# Patient Record
Sex: Female | Born: 1955
Health system: Southern US, Community
[De-identification: ages and names within clinical notes are randomized; demographics above are authoritative.]

## PROBLEM LIST (undated history)

## (undated) DIAGNOSIS — M1990 Unspecified osteoarthritis, unspecified site: Secondary | ICD-10-CM

## (undated) DIAGNOSIS — I10 Essential (primary) hypertension: Secondary | ICD-10-CM

## (undated) DIAGNOSIS — M0520 Rheumatoid vasculitis with rheumatoid arthritis of unspecified site: Secondary | ICD-10-CM

## (undated) DIAGNOSIS — E7800 Pure hypercholesterolemia, unspecified: Secondary | ICD-10-CM

## (undated) DIAGNOSIS — E559 Vitamin D deficiency, unspecified: Secondary | ICD-10-CM

## (undated) DIAGNOSIS — E1169 Type 2 diabetes mellitus with other specified complication: Secondary | ICD-10-CM

## (undated) DIAGNOSIS — E669 Obesity, unspecified: Secondary | ICD-10-CM

## (undated) HISTORY — DX: Rheumatoid vasculitis with rheumatoid arthritis of unspecified site: M05.20

## (undated) HISTORY — DX: Type 2 diabetes mellitus with other specified complication: E11.69

## (undated) HISTORY — PX: TUBAL LIGATION: SHX77

## (undated) HISTORY — DX: Obesity, unspecified: E66.9

## (undated) HISTORY — DX: Essential (primary) hypertension: I10

## (undated) HISTORY — PX: BREAST EXCISIONAL BIOPSY: SUR124

## (undated) HISTORY — PX: ABDOMINAL HYSTERECTOMY: SHX81

## (undated) HISTORY — DX: Vitamin D deficiency, unspecified: E55.9

## (undated) HISTORY — DX: Pure hypercholesterolemia, unspecified: E78.00

## (undated) HISTORY — DX: Morbid (severe) obesity due to excess calories: E66.01

---
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
## 1998-08-17 ENCOUNTER — Inpatient Hospital Stay (HOSPITAL_COMMUNITY): Admission: RE | Admit: 1998-08-17 | Discharge: 1998-08-20 | Payer: Self-pay | Admitting: Obstetrics and Gynecology

## 1998-09-15 ENCOUNTER — Ambulatory Visit (HOSPITAL_COMMUNITY): Admission: RE | Admit: 1998-09-15 | Discharge: 1998-09-15 | Payer: Self-pay | Admitting: Obstetrics and Gynecology

## 1998-09-15 ENCOUNTER — Encounter: Payer: Self-pay | Admitting: Obstetrics and Gynecology

## 1999-09-01 ENCOUNTER — Other Ambulatory Visit: Admission: RE | Admit: 1999-09-01 | Discharge: 1999-09-01 | Payer: Self-pay | Admitting: Obstetrics and Gynecology

## 1999-09-29 ENCOUNTER — Encounter: Payer: Self-pay | Admitting: Obstetrics and Gynecology

## 1999-09-29 ENCOUNTER — Ambulatory Visit (HOSPITAL_COMMUNITY): Admission: RE | Admit: 1999-09-29 | Discharge: 1999-09-29 | Payer: Self-pay | Admitting: Obstetrics and Gynecology

## 2000-12-19 ENCOUNTER — Other Ambulatory Visit: Admission: RE | Admit: 2000-12-19 | Discharge: 2000-12-19 | Payer: Self-pay | Admitting: Obstetrics and Gynecology

## 2001-07-14 ENCOUNTER — Encounter: Payer: Self-pay | Admitting: Emergency Medicine

## 2001-07-14 ENCOUNTER — Encounter: Payer: Self-pay | Admitting: Internal Medicine

## 2001-07-14 ENCOUNTER — Inpatient Hospital Stay (HOSPITAL_COMMUNITY): Admission: EM | Admit: 2001-07-14 | Discharge: 2001-07-16 | Payer: Self-pay | Admitting: Emergency Medicine

## 2001-12-22 ENCOUNTER — Other Ambulatory Visit: Admission: RE | Admit: 2001-12-22 | Discharge: 2001-12-22 | Payer: Self-pay | Admitting: Obstetrics and Gynecology

## 2002-02-03 ENCOUNTER — Ambulatory Visit (HOSPITAL_COMMUNITY): Admission: RE | Admit: 2002-02-03 | Discharge: 2002-02-03 | Payer: Self-pay | Admitting: Rheumatology

## 2002-02-20 ENCOUNTER — Encounter: Payer: Self-pay | Admitting: Rheumatology

## 2002-02-20 ENCOUNTER — Encounter: Admission: RE | Admit: 2002-02-20 | Discharge: 2002-02-20 | Payer: Self-pay | Admitting: Rheumatology

## 2002-03-09 ENCOUNTER — Ambulatory Visit (HOSPITAL_COMMUNITY): Admission: RE | Admit: 2002-03-09 | Discharge: 2002-03-09 | Payer: Self-pay | Admitting: Rheumatology

## 2002-03-09 ENCOUNTER — Encounter: Payer: Self-pay | Admitting: Rheumatology

## 2002-03-20 ENCOUNTER — Encounter: Payer: Self-pay | Admitting: Rheumatology

## 2002-03-20 ENCOUNTER — Ambulatory Visit (HOSPITAL_COMMUNITY): Admission: RE | Admit: 2002-03-20 | Discharge: 2002-03-20 | Payer: Self-pay | Admitting: Rheumatology

## 2003-01-25 ENCOUNTER — Other Ambulatory Visit: Admission: RE | Admit: 2003-01-25 | Discharge: 2003-01-25 | Payer: Self-pay | Admitting: Obstetrics and Gynecology

## 2004-02-11 ENCOUNTER — Other Ambulatory Visit: Admission: RE | Admit: 2004-02-11 | Discharge: 2004-02-11 | Payer: Self-pay | Admitting: Obstetrics and Gynecology

## 2004-07-02 ENCOUNTER — Ambulatory Visit: Payer: Self-pay | Admitting: Hematology & Oncology

## 2004-12-02 ENCOUNTER — Emergency Department (HOSPITAL_COMMUNITY): Admission: AD | Admit: 2004-12-02 | Discharge: 2004-12-02 | Payer: Self-pay | Admitting: Family Medicine

## 2004-12-26 ENCOUNTER — Encounter: Admission: RE | Admit: 2004-12-26 | Discharge: 2004-12-26 | Payer: Self-pay | Admitting: Orthopaedic Surgery

## 2005-06-25 ENCOUNTER — Other Ambulatory Visit: Admission: RE | Admit: 2005-06-25 | Discharge: 2005-06-25 | Payer: Self-pay | Admitting: Obstetrics and Gynecology

## 2010-02-01 ENCOUNTER — Ambulatory Visit (HOSPITAL_BASED_OUTPATIENT_CLINIC_OR_DEPARTMENT_OTHER): Admission: RE | Admit: 2010-02-01 | Discharge: 2010-02-01 | Payer: Self-pay | Admitting: Internal Medicine

## 2010-02-01 ENCOUNTER — Ambulatory Visit: Payer: Self-pay | Admitting: Interventional Radiology

## 2010-09-25 ENCOUNTER — Other Ambulatory Visit: Payer: Self-pay | Admitting: Family Medicine

## 2010-09-25 DIAGNOSIS — M545 Low back pain, unspecified: Secondary | ICD-10-CM

## 2010-10-01 ENCOUNTER — Ambulatory Visit
Admission: RE | Admit: 2010-10-01 | Discharge: 2010-10-01 | Disposition: A | Discharge status: Home / Self Care | Payer: Federal, State, Local not specified - PPO | Source: Ambulatory Visit | Attending: Family Medicine | Admitting: Family Medicine

## 2010-10-01 DIAGNOSIS — M545 Low back pain, unspecified: Secondary | ICD-10-CM

## 2010-12-29 NOTE — Consult Note (Signed)
[place]. [facility]  Patient:    Rebekah [name], Rebekah [name] Visit Number: [id number] MRN: [id number]          Service Type: MED Location: [phone] Attending Physician:  Julieanne Manson Dictated by:   Marlan Palau, M.D. Proc. Date: 07/14/01 Admit Date:  07/14/2001   CC:         Lemmie Evens, M.D.             Julieanne Manson, M.D.                          Consultation Report  HISTORY OF PRESENT ILLNESS:  Rebekah [name] is a 55 year old right-handed black female born 12/09/1955 with a history of rheumatoid arthritis.  This patient is followed by Dr. Jimmy Footman and is also followed by Dr. Delrae Alfred. The patient has recently started prednisone within the last week or two and is currently on 15 mg of prednisone a day.  The patient felt poorly this morning, claimed to feel light-headed and according to the patients husband appeared to be "glassy eyed."  The patient, however, went to work.  Does recall going to work but does not remember doing anything once she got there.  Patient apparently started getting weak in a generalized fashion, started slurring her works, and appeared to be slightly confused.  Patient was taken to the emergency room for an evaluation, initially going to [facility] and then being sent over to [facility].  The patients husband claims at that point that she was requiring assistance to walk, feeling weak all over, tending to collapse, was slurring her words.  Patient herself recalls nothing of this.  The patient has been sent to [facility] and now is feeling back to baseline.  Patient denies any focal numbness or weakness on the arms, legs, or face.  Denies any visual field changes, loss of vision, double vision.  It is not clear that this patient ever actually blacked out or had any true syncope.  Neurology was asked to see this patient for further evaluation and management.  MRI scan of the brain has  been requested, but has not yet been done.  PAST MEDICAL HISTORY: 1. History of confusional episode, unknown etiology. 2. History of obesity. 3. Rheumatoid arthritis. 4. History of hysterectomy. 5. History of bilateral tubal ligation. 6. History of cesarean section x 2. 7. History of tongue biopsy for a birth mark.  MEDICATIONS: 1. Prednisone 15 mg q.d. 2. Methotrexate possibly 10 mg once a week. 3. Folate 1 mg q.d.  ALLERGIES:  No known drug allergies.  SOCIAL HISTORY:  Smokes a very occasional cigarette.  Drinks alcohol on occasion.  Patient is married, has three children.  All are alive and well. Patient works as a Sports coach for [employer].  FAMILY HISTORY:  Mother is alive and well.  Father died with intracranial hemorrhage at age 74.  Patient has three brothers, three sisters all alive and well.  REVIEW OF SYSTEMS:  No recent fevers, chills.  Patient denies any diaphoresis. Does note some slight neck pain.  Denies shortness of breath, chest pains, nausea, vomiting.  Denies any problems controlling the bowels or the bladder. Has some left ankle pain following a fall several weeks ago.  PHYSICAL EXAMINATION  VITAL SIGNS:  Blood pressure 130/85, heart rate 98, respiratory rate 20, temperature afebrile.  GENERAL:  This patient is a minimally to moderately  obese black female who is alert and cooperative at the time of examination.  HEENT:  Head is atraumatic.  Eyes:  Pupils are equal, round and reactive to light.  Disks are flat bilaterally.  NECK:  Supple.  No carotid bruits noted.  RESPIRATORY:  Clear to auscultation, percussion.  CARDIOVASCULAR:  Regular rate and rhythm without obvious murmurs or rubs.  EXTREMITIES:  Without significant edema.  NEUROLOGIC:  Cranial nerves as above.  Facial symmetry is present.  Patient has good sensation of the face to pin prick and soft touch bilaterally. Patient has good strength to facial muscles and the  muscles to head turn and shoulder shrug bilaterally.  Patient has no evidence of an aphasia.  Motor test reveals 5/5 strength in all fours.  Good symmetric motor tone is noted throughout.  Sensory testing is intact to pin prick, soft touch, vibratory sensation throughout.  Patient has good finger-nose-finger, heel-to-shin. Patient was not ambulated.  No pronator drift is seen.  Again, deep tendon reflexes are symmetric and normal.  Toes are downgoing bilaterally.  LABORATORIES:  White count 5.1, hemoglobin 12.3, hematocrit 34.6, MCV 85.6, platelets 259,000.  INR 1.0.  Sodium 136, potassium 3.6, chloride 105, CO2 25, glucose 119, BUN 10, creatinine 0.6, calcium 8.9, total protein 7.2, albumin 3.4, AST 18, ALT 23, alkaline phosphatase 84, total bilirubin 0.5.  Drug screen is negative.  Urinalysis reveals specific gravity 1.021, pH 7.0, positive nitrites seen.  IMPRESSION: 1. Confusional episode, etiology unclear. 2. Rheumatoid arthritis.  Need to evaluate this patient for a possible TIA or seizure or other metabolic disturbance.  It is not clear whether patient had any focal symptoms per se at all during the above event.  Patient seemed to have generalized weakness, slurred speech, and confusion.  Has no recollection whatsoever for several hours during the day today.  PLAN: 1. MRI scan of the brain has been ordered. 2. MR angiogram. 3. EEG study. 4. Aspirin therapy for now.  Will follow patients clinical course while    in-house. Dictated by:   Marlan Palau, M.D. Attending Physician:  Julieanne Manson DD:  07/14/01 TD:  07/15/01 Job: [id number]

## 2010-12-29 NOTE — H&P (Signed)
[place]. [facility]  Patient:    Rebekah [name], Rebekah [name] Visit Number: [id number] MRN: [id number]          Service Type: Attending:  Julieanne Manson, M.D. Dictated by:   Julieanne Manson, M.D. Adm. Date:  07/14/01                           History and Physical  CHIEF COMPLAINT: Episode of possible loss of consciousness, slurring, and maybe ataxia.  HISTORY OF PRESENT ILLNESS: This is a 55 year old female, with a history of newly diagnosed rheumatoid arthritis, admitted through the emergency department after an episode of possible loss of consciousness this morning. The patient states that she has been feeling fine and awakened from sleep feeling her usual self.  She did not eat breakfast.  She took her weekly dose of methotrexate and this is her third dose in doing so.  She believes it is around 10 mg.  She bathed and dressed for work and was sitting at her computer at homes and when arising from the computer felt light headed.  She states she continued to feel odd while driving her car to work, "eerie feeling," with the sense that she did not have complete control over her body though she did not note any actual motor problems in control of her body.  While walking to the outside door of her office building she felt as if she was stumbling.  Once she arrived at her actual office door she states she cannot recall anything further, though she does seem to recall comments that were made by co-workers who were trying to contact her husband.  The patient was taken to [facility] for initial evaluation and was felt to have some difficulty sitting up there, some slurred speech perhaps, and apparently there was a question of her grip strength, and she was transferred to [facility] Emergency Department for further evaluation.  Since being in the [facility] facility she has actually appeared normal. The  patient denies any history of loss of urine or stool control.  She has had no chest pain, shortness of breath, or palpitations associated with this.  The patient does state that she ran out of her prednisone 15 mg q.d. six days ago. There were no refills and she had an appointment today with her rheumatologist, Dr. Jimmy Footman, and felt that perhaps she would just get it refilled today if needed.  She does state that she has had increased discomfort in her joints, particularly her knees and her hands, since she ran out of the prednisone.  PAST MEDICAL HISTORY:  1. Obesity.  2. Rheumatoid arthritis, for which she has probably had symptoms for over a     year and this just worsened over the past month.  PAST SURGICAL HISTORY:  1. Cesarean section x 2, 1977 and 1984.  2. Right tongue biopsy in 1975, benign.  3. Uterine myomectomy in 1992.  4. Bilateral tubal ligation in 1990.  5. TAH/BSO in 2000 for benign reasons.  6. Tonsillectomy in 1967.  MEDICATIONS:  1. Methotrexate, I believe 10 mg weekly.  2. Folate 5 mg q.d.  3. Climara patch.  ALLERGIES: No known drug allergies.  FAMILY HISTORY: Father died at age 2 of cerebral hemorrhage.  Mother, age 76, is healthy.  Two sisters, ages 51 and 50, healthy.  Three children, ages 85, 5, and 27, healthy.  SOCIAL HISTORY: The  patient is married to her second husband for eight years. 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  LABORATORY DATA: In the emergency room CT of the brain was reportedly within normal limits.  WBC 5.1, hemoglobin 12.3, hematocrit 34.3, platelets 259,000.  Sodium 136, potassium 3.6, chloride 105, bicarbonate 25, glucose 119, creatinine 0.6, BUN 10.  Liver enzymes normal.  PT 13.2.  Drug screen negative.  Alcohol level negative.  EKG showed normal sinus rhythm.  PHYSICAL EXAMINATION:  VITAL SIGNS: The patient is afebrile.   Respiratory rate 16, blood pressure 128/76, heart rate 100.  No orthostasis on evaluation earlier in the emergency room.  O2 saturation 99% on room air.  GENERAL: The patient is an obese female, in no acute distress.  HEENT: PERRL.  EOMI.  Discs sharp.  Tympanic membranes clear.  Throat without injection.  NECK: Supple without adenopathy or thyromegaly.  CHEST: Clear.  CARDIOVASCULAR: Regular rate and rhythm.  Normal S1 and S2.  No S3, S4, or murmur appreciated.  No carotid bruits.  Carotid, radial, femoral, DP pulses normodynamic and equal.  ABDOMEN: Obese.  Bowel sounds present throughout.  Nontender.  No organomegaly or masses appreciated.  EXTREMITIES: Moderate swelling without erythema about the left ankle.  She is tender along the posterior fibula.  NEUROLOGIC: Alert and oriented x 3.  Cranial nerves 2-12 grossly intact. Motor essentially 5/5 throughout.  She is somewhat limited by arthritic pain and stiffness in her grip and in her left thigh strength testing.  Sensory is grossly within normal limits.  Speech is clear.  She has a good gag reflex. Rapid alternating motions are within normal limits.  ASSESSMENT/PLAN:  1. Episode of questionable syncope with slurred speech and possibly ataxia.     Dr. Anne Hahn has been consulted for evaluation for possible seizure or     transient ischemic attack with MRA/MRI of the brain and carotids ordered.     Electroencephalogram also ordered.  Other considerations would be adrenal     suppression or insufficiency since suddenly discontinuing her fairly     low-dose prednisone usage.  Will check adrenocorticotropic hormone     stimulation test and restart her prednisone subsequently.  This is     unlikely as a culprit since her blood pressure is stable and her     electrolytes are stable.  Will check a fasting cholesterol and BMET in the     morning and also will need to evaluate side effects of methotrexate.     Certainly the fact that  she did not eat breakfast this morning is not      helpful.  For now, treatment with intravenous fluids, aspirin, and follow     with telemetry.  2. Rheumatoid arthritis.  Restart prednisone tomorrow as long the patient     remains clinically stable overnight.  Will also check an x-ray of her     ankle.  She did bump her foot before this started and will make sure no     injury is the cause of this other than her rheumatoid arthritis. Dictated by:   Julieanne Manson, M.D. Attending:  Julieanne Manson, M.D. DD:  07/14/01 TD:  07/15/01 Job: [id number]

## 2013-09-11 ENCOUNTER — Encounter: Payer: Self-pay | Admitting: *Deleted

## 2013-09-11 ENCOUNTER — Encounter: Payer: Federal, State, Local not specified - PPO | Attending: Family Medicine | Admitting: *Deleted

## 2013-09-11 VITALS — Ht 65.0 in | Wt 226.3 lb

## 2013-09-11 DIAGNOSIS — Z713 Dietary counseling and surveillance: Secondary | ICD-10-CM | POA: Insufficient documentation

## 2013-09-11 DIAGNOSIS — I10 Essential (primary) hypertension: Secondary | ICD-10-CM

## 2013-09-11 DIAGNOSIS — E669 Obesity, unspecified: Secondary | ICD-10-CM | POA: Insufficient documentation

## 2013-09-11 NOTE — Progress Notes (Signed)
  Medical Nutrition Therapy:  Appt start time: 0800 end time:  0900.  Assessment:  Primary concerns today: HTN and obesity. Weight loss of 25 pounds since patient was referred her on 07/22/13! She states she is drinking more water @ 64 oz before work work and again after work.  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 She is working out in AM from 6 - 7  AM and again after work!   Preferred Learning Style: all of the following  Auditory  Visual  Hands on  Learning Readiness:   Ready  Change in progress  MEDICATIONS: see list   DIETARY INTAKE:  24-hr recall:  B ( AM): Protein shake with Silk milk,    Snk ( AM): fresh fruit  L ( PM): Lean Cuisine for lunch, no beverage Snk ( PM): fresh fruit D ( PM): egg white omelet with spinach OR lean meat, occasionally starch, green vegetables, water (used to drink juice) Snk ( PM): none Beverages: water  Usual physical activity: She is working with a Physiological scientist 3 times a week AND going to gym on other days walking 3 miles on treadmill and track combined.   Estimated energy needs: 1200 calories 135 g carbohydrates 90 g protein 33 g fat  Intervention:  Nutrition counseling for weight loss and HTN initiated. Discussed Carb Counting as method of portion control, reading food labels, and benefits of increased activity. Commended her on her weight loss of over 25 pounds to date and her positive behavior changes including increased water intake, activity level and food choices.   Plan:  Aim for 2 Carb Choices per meal (30 grams) +/- 1 either way  Aim for 0-1 Carbs per snack if hungry  Consider reading food labels for Total Carbohydrate of foods Continue with your activity level as tolerated GREAT JOB ALREADY!!!  Teaching Method Utilized: Visual, Auditory and Hands on  Handouts given during visit include: Carb Counting and Food Label handouts Meal Plan Card  Barriers to  learning/adherence to lifestyle change: none, she is doing great1  Demonstrated degree of understanding via:  Teach Back   Monitoring/Evaluation:  Dietary intake, exercise, reading food labels, and body weight in 1 month.

## 2013-09-11 NOTE — Patient Instructions (Signed)
Plan:  Aim for 2 Carb Choices per meal (30 grams) +/- 1 either way  Aim for 0-1 Carbs per snack if hungry  Consider reading food labels for Total Carbohydrate of foods Continue with your activity level as tolerated GREAT JOB ALREADY!!!

## 2013-11-03 ENCOUNTER — Encounter: Payer: Federal, State, Local not specified - PPO | Attending: Family Medicine | Admitting: *Deleted

## 2013-11-03 DIAGNOSIS — E669 Obesity, unspecified: Secondary | ICD-10-CM | POA: Insufficient documentation

## 2013-11-03 DIAGNOSIS — I10 Essential (primary) hypertension: Secondary | ICD-10-CM

## 2013-11-03 DIAGNOSIS — Z713 Dietary counseling and surveillance: Secondary | ICD-10-CM | POA: Insufficient documentation

## 2013-11-03 NOTE — Progress Notes (Signed)
  Medical Nutrition Therapy:  Appt start time: 1600 end time: 1630.  Assessment:  Primary concerns today: HTN and obesity follow up visit. She has found a new trainer that she relates to very well. She is going to the gym 3 or more times a week or will work out at home on stationary bike and with weights. She has cut out bread and is using more vegetables with each meal. She is tired of PNB, so we discussed other protein options. Weight is down 5 pounds in past 6 weeks!  Preferred Learning Style: all of the following  Auditory  Visual  Hands on  Learning Readiness:   Ready  Change in progress  MEDICATIONS: see list   DIETARY INTAKE:  24-hr recall:  B ( AM): Protein shake with Silk milk,    Snk ( AM): fresh fruit  L ( PM): Lean Cuisine for lunch, trying to drink more water now (no more juices!) Snk ( PM): fresh fruit D ( PM): egg white omelet with spinach OR lean meat, occasionally starch, green vegetables, water (used to drink juice) Snk ( PM): none Beverages: water  Usual physical activity: She is working with a Physiological scientist 3 times a week AND going to gym on other days walking 3 miles on treadmill and track combined.   Estimated energy needs: 1200 calories 135 g carbohydrates 90 g protein 33 g fat  Intervention:  Nutrition counseling for weight loss and HTN continued. Commended her on her Carb Counting as method of portion control, reading food labels, and her increased activity. Commended her on her continued weight loss and her positive behavior changes including increased water intake, activity level and food choices.   Plan:  Continue to aim for 2 Carb Choices per meal (30 grams) +/- 1 either way  Continue to aim for 0-1 Carbs per snack if hungry  Continue reading food labels for Total Carbohydrate of foods Continue with your activity level as tolerated Consider going on computer and look for Arm Chair Exercise Videos that you can do in your chair at work or at  home. GREAT JOB ALREADY!!!   Teaching Method Utilized: Visual, Auditory and Hands on  Handouts given during visit include: No new handouts at this visit  Barriers to learning/adherence to lifestyle change: none, she is doing great1  Demonstrated degree of understanding via:  Teach Back   Monitoring/Evaluation:  Dietary intake, exercise, reading food labels, and body weight in 6 weeks for weigh in only.

## 2013-11-03 NOTE — Patient Instructions (Signed)
Plan:  Continue to aim for 2 Carb Choices per meal (30 grams) +/- 1 either way  Continue to aim for 0-1 Carbs per snack if hungry  Continue reading food labels for Total Carbohydrate of foods Continue with your activity level as tolerated Consider going on computer and look for Arm Chair Exercise Videos that you can do in your chair at work or at home. GREAT JOB ALREADY!!!

## 2013-12-04 ENCOUNTER — Ambulatory Visit
Admission: RE | Admit: 2013-12-04 | Discharge: 2013-12-04 | Disposition: A | Discharge status: Home / Self Care | Payer: Federal, State, Local not specified - PPO | Source: Ambulatory Visit | Attending: Rheumatology | Admitting: Rheumatology

## 2013-12-04 ENCOUNTER — Other Ambulatory Visit: Payer: Self-pay | Admitting: Rheumatology

## 2013-12-04 DIAGNOSIS — M069 Rheumatoid arthritis, unspecified: Secondary | ICD-10-CM

## 2013-12-17 ENCOUNTER — Ambulatory Visit: Payer: Federal, State, Local not specified - PPO | Admitting: *Deleted

## 2014-03-31 ENCOUNTER — Other Ambulatory Visit: Payer: Self-pay | Admitting: Obstetrics and Gynecology

## 2014-03-31 DIAGNOSIS — R928 Other abnormal and inconclusive findings on diagnostic imaging of breast: Secondary | ICD-10-CM

## 2014-04-06 ENCOUNTER — Ambulatory Visit
Admission: RE | Admit: 2014-04-06 | Discharge: 2014-04-06 | Disposition: A | Discharge status: Home / Self Care | Payer: Federal, State, Local not specified - PPO | Source: Ambulatory Visit | Attending: Obstetrics and Gynecology | Admitting: Obstetrics and Gynecology

## 2014-04-06 ENCOUNTER — Other Ambulatory Visit: Payer: Self-pay | Admitting: Obstetrics and Gynecology

## 2014-04-06 DIAGNOSIS — R928 Other abnormal and inconclusive findings on diagnostic imaging of breast: Secondary | ICD-10-CM

## 2014-04-15 ENCOUNTER — Ambulatory Visit
Admission: RE | Admit: 2014-04-15 | Discharge: 2014-04-15 | Disposition: A | Discharge status: Home / Self Care | Payer: Federal, State, Local not specified - PPO | Source: Ambulatory Visit | Attending: Obstetrics and Gynecology | Admitting: Obstetrics and Gynecology

## 2014-04-15 ENCOUNTER — Other Ambulatory Visit: Payer: Self-pay | Admitting: Obstetrics and Gynecology

## 2014-04-15 DIAGNOSIS — R928 Other abnormal and inconclusive findings on diagnostic imaging of breast: Secondary | ICD-10-CM

## 2014-11-11 DIAGNOSIS — R7303 Prediabetes: Secondary | ICD-10-CM | POA: Insufficient documentation

## 2014-11-11 DIAGNOSIS — IMO0001 Reserved for inherently not codable concepts without codable children: Secondary | ICD-10-CM | POA: Insufficient documentation

## 2014-11-12 DIAGNOSIS — M159 Polyosteoarthritis, unspecified: Secondary | ICD-10-CM | POA: Insufficient documentation

## 2014-11-12 DIAGNOSIS — M0579 Rheumatoid arthritis with rheumatoid factor of multiple sites without organ or systems involvement: Secondary | ICD-10-CM | POA: Insufficient documentation

## 2015-04-11 ENCOUNTER — Other Ambulatory Visit: Payer: Self-pay | Admitting: Obstetrics and Gynecology

## 2015-04-11 DIAGNOSIS — R921 Mammographic calcification found on diagnostic imaging of breast: Secondary | ICD-10-CM

## 2015-04-12 LAB — CYTOLOGY - PAP

## 2015-05-23 ENCOUNTER — Ambulatory Visit
Admission: RE | Admit: 2015-05-23 | Discharge: 2015-05-23 | Disposition: A | Discharge status: Home / Self Care | Payer: Federal, State, Local not specified - PPO | Source: Ambulatory Visit | Attending: Obstetrics and Gynecology | Admitting: Obstetrics and Gynecology

## 2015-05-23 DIAGNOSIS — R921 Mammographic calcification found on diagnostic imaging of breast: Secondary | ICD-10-CM

## 2015-11-28 DIAGNOSIS — R609 Edema, unspecified: Secondary | ICD-10-CM | POA: Diagnosis not present

## 2015-12-19 DIAGNOSIS — R739 Hyperglycemia, unspecified: Secondary | ICD-10-CM | POA: Diagnosis not present

## 2015-12-19 DIAGNOSIS — Z0000 Encounter for general adult medical examination without abnormal findings: Secondary | ICD-10-CM | POA: Diagnosis not present

## 2015-12-19 DIAGNOSIS — Z79899 Other long term (current) drug therapy: Secondary | ICD-10-CM | POA: Diagnosis not present

## 2015-12-19 DIAGNOSIS — E559 Vitamin D deficiency, unspecified: Secondary | ICD-10-CM | POA: Diagnosis not present

## 2016-01-02 DIAGNOSIS — M25561 Pain in right knee: Secondary | ICD-10-CM | POA: Diagnosis not present

## 2016-01-12 DIAGNOSIS — M069 Rheumatoid arthritis, unspecified: Secondary | ICD-10-CM | POA: Diagnosis not present

## 2016-01-12 DIAGNOSIS — Z23 Encounter for immunization: Secondary | ICD-10-CM | POA: Diagnosis not present

## 2016-01-12 DIAGNOSIS — Z87891 Personal history of nicotine dependence: Secondary | ICD-10-CM | POA: Diagnosis not present

## 2016-01-13 DIAGNOSIS — Z6837 Body mass index (BMI) 37.0-37.9, adult: Secondary | ICD-10-CM | POA: Diagnosis not present

## 2016-01-13 DIAGNOSIS — Z713 Dietary counseling and surveillance: Secondary | ICD-10-CM | POA: Diagnosis not present

## 2016-03-26 DIAGNOSIS — E559 Vitamin D deficiency, unspecified: Secondary | ICD-10-CM | POA: Diagnosis not present

## 2016-04-13 DIAGNOSIS — Z13820 Encounter for screening for osteoporosis: Secondary | ICD-10-CM | POA: Diagnosis not present

## 2016-04-13 DIAGNOSIS — Z01419 Encounter for gynecological examination (general) (routine) without abnormal findings: Secondary | ICD-10-CM | POA: Diagnosis not present

## 2016-04-13 DIAGNOSIS — Z6837 Body mass index (BMI) 37.0-37.9, adult: Secondary | ICD-10-CM | POA: Diagnosis not present

## 2016-04-17 ENCOUNTER — Other Ambulatory Visit: Payer: Self-pay | Admitting: Obstetrics and Gynecology

## 2016-04-17 DIAGNOSIS — Z1231 Encounter for screening mammogram for malignant neoplasm of breast: Secondary | ICD-10-CM

## 2016-04-22 ENCOUNTER — Emergency Department (HOSPITAL_COMMUNITY)
Admission: EM | Admit: 2016-04-22 | Discharge: 2016-04-22 | Disposition: A | Discharge status: Home / Self Care | Payer: Federal, State, Local not specified - PPO | Attending: Emergency Medicine | Admitting: Emergency Medicine

## 2016-04-22 ENCOUNTER — Emergency Department (HOSPITAL_COMMUNITY): Payer: Federal, State, Local not specified - PPO

## 2016-04-22 ENCOUNTER — Encounter (HOSPITAL_COMMUNITY): Payer: Self-pay | Admitting: *Deleted

## 2016-04-22 DIAGNOSIS — R0602 Shortness of breath: Secondary | ICD-10-CM | POA: Diagnosis not present

## 2016-04-22 DIAGNOSIS — E041 Nontoxic single thyroid nodule: Secondary | ICD-10-CM | POA: Diagnosis not present

## 2016-04-22 DIAGNOSIS — Z87891 Personal history of nicotine dependence: Secondary | ICD-10-CM | POA: Diagnosis not present

## 2016-04-22 DIAGNOSIS — R0789 Other chest pain: Secondary | ICD-10-CM | POA: Insufficient documentation

## 2016-04-22 DIAGNOSIS — I10 Essential (primary) hypertension: Secondary | ICD-10-CM | POA: Insufficient documentation

## 2016-04-22 DIAGNOSIS — E876 Hypokalemia: Secondary | ICD-10-CM

## 2016-04-22 DIAGNOSIS — R079 Chest pain, unspecified: Secondary | ICD-10-CM

## 2016-04-22 DIAGNOSIS — R071 Chest pain on breathing: Secondary | ICD-10-CM | POA: Diagnosis present

## 2016-04-22 LAB — BASIC METABOLIC PANEL
Anion gap: 10 (ref 5–15)
BUN: 21 mg/dL — ABNORMAL HIGH (ref 6–20)
CO2: 30 mmol/L (ref 22–32)
Calcium: 9.5 mg/dL (ref 8.9–10.3)
Chloride: 97 mmol/L — ABNORMAL LOW (ref 101–111)
Creatinine, Ser: 0.97 mg/dL (ref 0.44–1.00)
GFR calc Af Amer: >60 mL/min (ref >60)
GFR calc non Af Amer: >60 mL/min (ref >60)
Glucose, Bld: 159 mg/dL — ABNORMAL HIGH (ref 65–99)
Potassium: 2.8 mmol/L — ABNORMAL LOW (ref 3.5–5.1)
Sodium: 137 mmol/L (ref 135–145)

## 2016-04-22 LAB — CBC
HCT: 37.1 % (ref 36.0–46.0)
Hemoglobin: 12.3 g/dL (ref 12.0–15.0)
MCH: 29.4 pg (ref 26.0–34.0)
MCHC: 33.2 g/dL (ref 30.0–36.0)
MCV: 88.5 fL (ref 78.0–100.0)
Platelets: 225 10*3/uL (ref 150–400)
RBC: 4.19 MIL/uL (ref 3.87–5.11)
RDW: 13.3 % (ref 11.5–15.5)
WBC: 7.2 10*3/uL (ref 4.0–10.5)

## 2016-04-22 LAB — I-STAT TROPONIN, ED
Troponin i, poc: 0.01 ng/mL (ref 0.00–0.08)
Troponin i, poc: 0 ng/mL (ref 0.00–0.08)

## 2016-04-22 LAB — D-DIMER, QUANTITATIVE: D-Dimer, Quant: 0.42 ug/mL-FEU (ref 0.00–0.50)

## 2016-04-22 MED ORDER — SODIUM CHLORIDE 0.9 % IV BOLUS (SEPSIS)
500.0000 mL | Freq: Once | INTRAVENOUS | Status: AC
  Administered 2016-04-22: 500 mL via INTRAVENOUS

## 2016-04-22 MED ORDER — IOPAMIDOL (ISOVUE-370) INJECTION 76%
INTRAVENOUS | Status: AC
  Administered 2016-04-22: 100 mL
  Filled 2016-04-22: qty 100

## 2016-04-22 MED ORDER — KETOROLAC TROMETHAMINE 15 MG/ML IJ SOLN
15.0000 mg | Freq: Once | INTRAMUSCULAR | Status: AC
Start: 2016-04-22 — End: 2016-04-22
  Administered 2016-04-22: 15 mg via INTRAVENOUS
  Filled 2016-04-22: qty 1

## 2016-04-22 MED ORDER — POTASSIUM CHLORIDE CRYS ER 20 MEQ PO TBCR
40.0000 meq | EXTENDED_RELEASE_TABLET | Freq: Once | ORAL | Status: AC
  Administered 2016-04-22: 40 meq via ORAL
  Filled 2016-04-22: qty 2

## 2016-04-22 MED ORDER — NAPROXEN 500 MG PO TABS: 500.0000 mg | ORAL_TABLET | Freq: Two times a day (BID) | ORAL | 0 refills | Status: DC

## 2016-04-22 MED ORDER — ONDANSETRON HCL 4 MG/2ML IJ SOLN
4.0000 mg | Freq: Once | INTRAMUSCULAR | Status: AC
  Administered 2016-04-22: 4 mg via INTRAVENOUS
  Filled 2016-04-22: qty 2

## 2016-04-22 MED ORDER — HYDROCODONE-ACETAMINOPHEN 5-325 MG PO TABS: 1.0000 | ORAL_TABLET | Freq: Four times a day (QID) | ORAL | 0 refills | Status: DC | PRN

## 2016-04-22 MED ORDER — MORPHINE SULFATE (PF) 2 MG/ML IV SOLN
2.0000 mg | Freq: Once | INTRAVENOUS | Status: AC
  Administered 2016-04-22: 2 mg via INTRAVENOUS
  Filled 2016-04-22: qty 1

## 2016-04-22 MED ORDER — GI COCKTAIL ~~LOC~~
30.0000 mL | Freq: Once | ORAL | Status: AC
  Administered 2016-04-22: 30 mL via ORAL
  Filled 2016-04-22: qty 30

## 2016-04-22 MED ORDER — ASPIRIN 81 MG PO CHEW
324.0000 mg | CHEWABLE_TABLET | Freq: Once | ORAL | Status: AC
  Administered 2016-04-22: 324 mg via ORAL
  Filled 2016-04-22: qty 4

## 2016-04-22 MED ORDER — NITROGLYCERIN 0.4 MG SL SUBL
0.4000 mg | SUBLINGUAL_TABLET | SUBLINGUAL | Status: DC | PRN
  Administered 2016-04-22: 0.4 mg via SUBLINGUAL
  Filled 2016-04-22: qty 1

## 2016-04-22 MED ORDER — POTASSIUM CHLORIDE 10 MEQ/100ML IV SOLN
10.0000 meq | INTRAVENOUS | Status: AC
Start: 2016-04-22 — End: 2016-04-22
  Administered 2016-04-22 (×2): 10 meq via INTRAVENOUS
  Filled 2016-04-22 (×2): qty 100

## 2016-04-22 NOTE — ED Provider Notes (Signed)
Pt checked out by Dr. Tyrone Nine waiting on 2nd trop.  2nd trop was neg however when pt attempted to stand and walk she was in severe pain in the left side of her chest which seems to be chest wall and no improving. Morphine initially helped a little but now gone.  Will given toradol.  D-dimer is neg and CXR wnl.  No hx of trauma.   10:25 AM On evaluation patient is in significant pain. She has severe pain with any movement and she states it is in the distal portion of the sternum and radiates into her back. She has no abdominal pain or arm pain. Morphine was helpful for short time but is having recurrent pain. Was not improved with a GI cocktail is not affected by eating. Her possible dissection. Patient's d-dimer is negative making the risk of PE lower the patient does take estrogen. Given patient's ongoing significant pain we'll do a CT dissection protocol for further evaluation.  1:22 PM CT neg for dissection or PE.  Pt does have thryroid nodule to f/u on but overall feeling better.  Pt able to walk with improved pain.  Pt ready for d/c.  Pt has no sx concerning for pneumonia on exam and felt fine till last night.  CLINICAL DATA:  Chest pain radiating to back.  Shortness of breath.  EXAM: CT ANGIOGRAPHY CHEST, ABDOMEN AND PELVIS  TECHNIQUE: Multidetector CT imaging through the chest, abdomen and pelvis was performed using the standard protocol during bolus administration of intravenous contrast. Multiplanar reconstructed images and MIPs were obtained and reviewed to evaluate the vascular anatomy.  CONTRAST:  100 mL of Isovue 370  COMPARISON:  None.  FINDINGS: CTA CHEST FINDINGS  Evaluation of the thoracic aorta is somewhat limited due to streak artifact off of venous contrast and cardiac motion. The thoracic aorta is normal in caliber, measuring 3.5 cm in the ascending thoracic aorta and less more distally. No significant atherosclerotic changes seen in the thoracic aorta. No  dissection identified. Branches off the arch of the thoracic aorta are normal in appearance. The pulmonary arteries are normal in caliber. The study was not tailored to evaluate for pulmonary emboli but none are seen. There is a nodule off the posterior right thyroid lobe on axial image 12 of series 6 measuring 17 mm. No definitive coronary artery calcifications. The heart size borderline. No effusions. The central airways are normal. No pneumothorax streaky opacities seen in the right middle lobe, the lingula, and bases, right greater than left. Two tiny nodules are seen in the lateral left lung on series 7, image 46 measuring less than 4 mm. A 4 mm nodule is seen in the right middle lobe on series 7, image 54. No other nodules or masses identified.  Review of the MIP images confirms the above findings.  CTA ABDOMEN AND PELVIS FINDINGS  The abdominal aorta is normal in caliber with no dissection or atherosclerotic change. The celiac artery is normal in appearance. The SMA is normal. There are 2 right renal arteries and a single left renal artery, normal in appearance. The inferior mesenteric artery is well opacified as well. The iliac arteries in the proximal femoral arteries are normal in appearance with no stenosis or dissection.  No free air or free fluid.  Evaluation of parenchymal organs is limited due to arterial phase imaging. The liver and gallbladder are normal. The portal vein is not opacified due to timing of contrast. The spleen, pancreas, and adrenal glands are normal. The  kidneys are unremarkable. No adenopathy. Evaluation of bowel is limited due to lack of oral contrast. Within this limitation, the stomach and small bowel are unremarkable. Colonic diverticuli are seen without diverticulitis. The appendix is well visualized with no appendicitis. There is a fat containing umbilical hernia.  No adenopathy or mass in the pelvis. The bladder is decompressed  but unremarkable. The patient is status post hysterectomy.  Mild degenerative changes in the thoracic spine and lower lumbar facets. No acute bony abnormalities. There is a bone island in the proximal right femur.  Review of the MIP images confirms the above findings.  IMPRESSION: 1. No aneurysm or dissection in the thoracic or abdominal aorta. The vessels are unremarkable. 2. Streaky opacity in the lung bases. While there is a component of atelectasis, superimposed pneumonia is not excluded on this study. Recommend follow-up to resolution. 3. Pulmonary nodules as noted above. See below for follow-up recommendations. 4. **An incidental finding of potential clinical significance has been found. There is a 17 mm right thyroid lobe nodule. Recommend ultrasound for better evaluation. This can be performed as an outpatient** No follow-up needed if patient is low-risk (and has no known or suspected primary neoplasm). Non-contrast chest CT can be considered in 12 months if patient is high-risk. This recommendation follows the consensus statement: Guidelines for Management of Incidental Pulmonary Nodules Detected on CT Images:From the Fleischner Society 2017; published online before print (10.1148/radiol.SG:5268862).   Blanchie Dessert, MD 04/22/16 [phone]

## 2016-04-22 NOTE — ED Notes (Signed)
Patient transported to CT 

## 2016-04-22 NOTE — ED Provider Notes (Signed)
[facility] DEPT Provider Note   CSN: [id number] Arrival date & time: 04/22/16  0253     History   Chief Complaint Chief Complaint  Patient presents with  . Chest Pain    HPI Rebekah [name] is a 60 y.o. female.  60 yo F with a chief complaint of chest pain. This been going on since this morning. Pain is worse with movement palpation breathing. Also worse when she lays back flat. Feels that radiates up into her neck. Denies nausea diaphoresis. She has some shortness of breath associated with this. She feels that somewhat is worse when she exerts herself. No history of prior MI. No family history, patient only risk factor is hypertension. She is on estrogen therapy for menopause. Denies history of prior PE or DVT. Denies hemoptysis long travel recent surgery.   The history is provided by the patient.  Chest Pain   This is a new problem. The current episode started 12 to 24 hours ago. The problem occurs constantly. The problem has not changed since onset.The pain is associated with movement and breathing. The pain is present in the substernal region, lateral region and epigastric region. The pain is at a severity of 8/10. The pain is moderate. The quality of the pain is described as exertional, sharp, pressure-like and pleuritic. The pain radiates to the left neck and right neck. Duration of episode(s) is 1 day. The symptoms are aggravated by certain positions, deep breathing and exertion. Associated symptoms include shortness of breath. Pertinent negatives include no dizziness, no fever, no headaches, no nausea, no palpitations and no vomiting. She has tried nothing for the symptoms. The treatment provided no relief. Risk factors: HTN.  Pertinent negatives for past medical history include no MI and no PE.    Past Medical History:  Diagnosis Date  . Hypertension     There are no active problems to display for this patient.   Past Surgical History:  Procedure Laterality Date  .  ABDOMINAL HYSTERECTOMY    . biopsy on tongue  1976  . CESAREAN SECTION    . TUBAL LIGATION      OB History    No data available       Home Medications    Prior to Admission medications   Medication Sig Start Date End Date Taking? Authorizing Provider  cholecalciferol (VITAMIN D) 1000 units tablet Take 1,000 Units by mouth daily.   Yes Historical Provider, MD  estradiol (ESTRACE) 1 MG tablet Take 1 mg by mouth daily.   Yes Historical Provider, MD  hydrochlorothiazide (HYDRODIURIL) 25 MG tablet Take 25 mg by mouth daily.   Yes Historical Provider, MD  leflunomide (ARAVA) 20 MG tablet Take 20 mg by mouth daily. 01/12/16  Yes Historical Provider, MD  lubiprostone (AMITIZA) 24 MCG capsule Take 24 mcg by mouth daily. 08/07/14  Yes Historical Provider, MD  Multiple Vitamin (MULTIVITAMIN) capsule Take 1 capsule by mouth daily.   Yes Historical Provider, MD    Family History No family history on file.  Social History Social History  Substance Use Topics  . Smoking status: Former Smoker    Quit date: 09/11/2010  . Smokeless tobacco: Never Used  . Alcohol use Yes     Comment: monthly      Allergies   Review of patient's allergies indicates no known allergies.   Review of Systems Review of Systems  Constitutional: Negative for chills and fever.  HENT: Negative for congestion and rhinorrhea.   Eyes: Negative for redness  and visual disturbance.  Respiratory: Positive for shortness of breath. Negative for wheezing.   Cardiovascular: Positive for chest pain. Negative for palpitations.  Gastrointestinal: Negative for nausea and vomiting.  Genitourinary: Negative for dysuria and urgency.  Musculoskeletal: Negative for arthralgias and myalgias.  Skin: Negative for pallor and wound.  Neurological: Negative for dizziness and headaches.     Physical Exam Updated Vital Signs BP 116/79   Pulse 100   Temp 99.9 F (37.7 C)   Resp 25   Ht 5\' 5"  (1.651 m)   Wt 223 lb 1 oz (101.2 kg)    SpO2 96%   BMI 37.12 kg/m   Physical Exam  Constitutional: She is oriented to person, place, and time. She appears well-developed and well-nourished. No distress.  HENT:  Head: Normocephalic and atraumatic.  Eyes: EOM are normal. Pupils are equal, round, and reactive to light.  Neck: Normal range of motion. Neck supple.  Cardiovascular: Normal rate and regular rhythm.  Exam reveals no gallop and no friction rub.   No murmur heard. Pulmonary/Chest: Effort normal. She has no wheezes. She has no rales. She exhibits tenderness (about the anterior chest wall).  Abdominal: Soft. She exhibits no distension and no mass. There is no tenderness. There is no guarding.  Musculoskeletal: She exhibits no edema or tenderness.  Neurological: She is alert and oriented to person, place, and time.  Skin: Skin is warm and dry. She is not diaphoretic.  Psychiatric: She has a normal mood and affect. Her behavior is normal.  Nursing note and vitals reviewed.    ED Treatments / Results  Labs (all labs ordered are listed, but only abnormal results are displayed) Labs Reviewed  BASIC METABOLIC PANEL - Abnormal; Notable for the following:       Result Value   Potassium 2.8 (*)    Chloride 97 (*)    Glucose, Bld 159 (*)    BUN 21 (*)    All other components within normal limits  CBC  D-DIMER, QUANTITATIVE (NOT AT [facility])  I-STAT TROPOININ, ED    EKG  EKG Interpretation  Date/Time:  Sunday April 22 2016 02:58:27 EDT Ventricular Rate:  105 PR Interval:  172 QRS Duration: 90 QT Interval:  352 QTC Calculation: 465 R Axis:   17 Text Interpretation:  Sinus tachycardia Otherwise normal ECG No significant change since last tracing Confirmed by Connery Shiffler MD, Quillian Quince [phone]) on 04/22/2016 3:12:38 AM       Radiology Dg Chest 2 View  Result Date: 04/22/2016 CLINICAL DATA:  Acute onset of mid chest pain and shortness of breath. Initial encounter. EXAM: CHEST  2 VIEW COMPARISON:  Chest radiograph  performed 12/04/2013 FINDINGS: The lungs are well-aerated. Mild bibasilar atelectasis is noted. There is no evidence of pleural effusion or pneumothorax. The heart is borderline normal in size. No acute osseous abnormalities are seen. IMPRESSION: Mild bibasilar atelectasis noted.  Lungs otherwise clear. Electronically Signed   By: Garald Balding M.D.   On: 04/22/2016 03:53    Procedures Procedures (including critical care time)  Medications Ordered in ED Medications  nitroGLYCERIN (NITROSTAT) SL tablet 0.4 mg (0.4 mg Sublingual Given 04/22/16 0607)  potassium chloride 10 mEq in 100 mL IVPB (10 mEq Intravenous New Bag/Given 04/22/16 0635)  morphine 2 MG/ML injection 2 mg (not administered)  ondansetron (ZOFRAN) injection 4 mg (not administered)  aspirin chewable tablet 324 mg (324 mg Oral Given 04/22/16 0541)  gi cocktail (Maalox,Lidocaine,Donnatal) (30 mLs Oral Given 04/22/16 0541)  potassium chloride SA (K-DUR,KLOR-CON)  CR tablet 40 mEq (40 mEq Oral Given 04/22/16 [id number])     Initial Impression / Assessment and Plan / ED Course  I have reviewed the triage vital signs and the nursing notes.  Pertinent labs & imaging results that were available during my care of the patient were reviewed by me and considered in my medical decision making (see chart for details).  Clinical Course    60 yo F With a chief complaint of chest pain. Most likely muscular skeletal by history and physical. Patient does have a risk factor of hypertension and his female some a present atypically. Will obtain a delta troponin as well as a d-dimer.  Initial trop negative, ddimer negative.  Patient continuing to have pain, will give dose of morphine.   Turned over to Dr. Maryan Rued, awaiting delta trop.  The patients results and plan were reviewed and discussed.   Any x-rays performed were independently reviewed by myself.   Differential diagnosis were considered with the presenting HPI.  Medications  nitroGLYCERIN  (NITROSTAT) SL tablet 0.4 mg (0.4 mg Sublingual Given 04/22/16 0607)  potassium chloride 10 mEq in 100 mL IVPB (10 mEq Intravenous New Bag/Given 04/22/16 0635)  morphine 2 MG/ML injection 2 mg (not administered)  ondansetron (ZOFRAN) injection 4 mg (not administered)  aspirin chewable tablet 324 mg (324 mg Oral Given 04/22/16 0541)  gi cocktail (Maalox,Lidocaine,Donnatal) (30 mLs Oral Given 04/22/16 0541)  potassium chloride SA (K-DUR,KLOR-CON) CR tablet 40 mEq (40 mEq Oral Given 04/22/16 0635)    Vitals:   04/22/16 0306 04/22/16 0308 04/22/16 0600  BP: 115/76  116/79  Pulse: 100  100  Resp: 24  25  Temp: 99.9 F (37.7 C)    SpO2: 99%  96%  Weight:  223 lb 1 oz (101.2 kg)   Height:  5\' 5"  (1.651 m)     Final diagnoses:  Hypokalemia  Nonspecific chest pain        Final Clinical Impressions(s) / ED Diagnoses   Final diagnoses:  Hypokalemia  Nonspecific chest pain    New Prescriptions New Prescriptions   No medications on file     Deno Etienne, DO 04/22/16 [id number]

## 2016-04-22 NOTE — ED Triage Notes (Signed)
Mid-chest pain since last pm with sob  No other symptoms

## 2016-04-22 NOTE — ED Notes (Signed)
Pt complains of ongoing CP and states the morphine helped for a couple minutes but it came back and the pain is still a 10/10.  Relayed this information to attending Dr.

## 2016-04-22 NOTE — Discharge Instructions (Addendum)
Take 4 over the counter ibuprofen tablets 3 times a day or 2 over-the-counter naproxen tablets twice a day for pain. Also take tylenol 1000mg (2 extra strength) four times a day.   Follow up with your PCP to see if they want further workup(like a stress test)

## 2016-04-27 DIAGNOSIS — Z23 Encounter for immunization: Secondary | ICD-10-CM | POA: Diagnosis not present

## 2016-04-27 DIAGNOSIS — E041 Nontoxic single thyroid nodule: Secondary | ICD-10-CM | POA: Diagnosis not present

## 2016-04-27 DIAGNOSIS — R739 Hyperglycemia, unspecified: Secondary | ICD-10-CM | POA: Diagnosis not present

## 2016-04-27 DIAGNOSIS — E876 Hypokalemia: Secondary | ICD-10-CM | POA: Diagnosis not present

## 2016-05-01 ENCOUNTER — Other Ambulatory Visit: Payer: Self-pay | Admitting: Family Medicine

## 2016-05-01 DIAGNOSIS — E041 Nontoxic single thyroid nodule: Secondary | ICD-10-CM

## 2016-05-07 DIAGNOSIS — M069 Rheumatoid arthritis, unspecified: Secondary | ICD-10-CM | POA: Diagnosis not present

## 2016-05-07 DIAGNOSIS — Z87891 Personal history of nicotine dependence: Secondary | ICD-10-CM | POA: Diagnosis not present

## 2016-05-09 DIAGNOSIS — E119 Type 2 diabetes mellitus without complications: Secondary | ICD-10-CM | POA: Diagnosis not present

## 2016-05-25 ENCOUNTER — Ambulatory Visit
Admission: RE | Admit: 2016-05-25 | Discharge: 2016-05-25 | Disposition: A | Discharge status: Home / Self Care | Payer: Federal, State, Local not specified - PPO | Source: Ambulatory Visit | Attending: Obstetrics and Gynecology | Admitting: Obstetrics and Gynecology

## 2016-05-25 DIAGNOSIS — Z1231 Encounter for screening mammogram for malignant neoplasm of breast: Secondary | ICD-10-CM

## 2016-06-04 ENCOUNTER — Ambulatory Visit
Admission: RE | Admit: 2016-06-04 | Discharge: 2016-06-04 | Disposition: A | Discharge status: Home / Self Care | Payer: Federal, State, Local not specified - PPO | Source: Ambulatory Visit | Attending: Family Medicine | Admitting: Family Medicine

## 2016-06-04 DIAGNOSIS — E042 Nontoxic multinodular goiter: Secondary | ICD-10-CM | POA: Diagnosis not present

## 2016-06-04 DIAGNOSIS — E041 Nontoxic single thyroid nodule: Secondary | ICD-10-CM

## 2016-07-13 DIAGNOSIS — M21611 Bunion of right foot: Secondary | ICD-10-CM | POA: Diagnosis not present

## 2016-07-13 DIAGNOSIS — M21612 Bunion of left foot: Secondary | ICD-10-CM | POA: Diagnosis not present

## 2016-07-23 DIAGNOSIS — M069 Rheumatoid arthritis, unspecified: Secondary | ICD-10-CM | POA: Diagnosis not present

## 2016-07-23 DIAGNOSIS — M0579 Rheumatoid arthritis with rheumatoid factor of multiple sites without organ or systems involvement: Secondary | ICD-10-CM | POA: Diagnosis not present

## 2016-08-15 DIAGNOSIS — H40023 Open angle with borderline findings, high risk, bilateral: Secondary | ICD-10-CM | POA: Diagnosis not present

## 2016-11-04 ENCOUNTER — Encounter (HOSPITAL_COMMUNITY): Payer: Self-pay

## 2016-11-04 ENCOUNTER — Emergency Department (HOSPITAL_COMMUNITY): Payer: Federal, State, Local not specified - PPO

## 2016-11-04 ENCOUNTER — Emergency Department (HOSPITAL_COMMUNITY)
Admission: EM | Admit: 2016-11-04 | Discharge: 2016-11-04 | Disposition: A | Discharge status: Home / Self Care | Payer: Federal, State, Local not specified - PPO | Attending: Emergency Medicine | Admitting: Emergency Medicine

## 2016-11-04 DIAGNOSIS — Z87891 Personal history of nicotine dependence: Secondary | ICD-10-CM | POA: Diagnosis not present

## 2016-11-04 DIAGNOSIS — I10 Essential (primary) hypertension: Secondary | ICD-10-CM | POA: Insufficient documentation

## 2016-11-04 DIAGNOSIS — J181 Lobar pneumonia, unspecified organism: Secondary | ICD-10-CM | POA: Diagnosis not present

## 2016-11-04 DIAGNOSIS — J189 Pneumonia, unspecified organism: Secondary | ICD-10-CM

## 2016-11-04 DIAGNOSIS — R079 Chest pain, unspecified: Secondary | ICD-10-CM | POA: Diagnosis not present

## 2016-11-04 DIAGNOSIS — R072 Precordial pain: Secondary | ICD-10-CM | POA: Diagnosis present

## 2016-11-04 LAB — CBC WITH DIFFERENTIAL/PLATELET
Basophils Absolute: 0 10*3/uL (ref 0.0–0.1)
Basophils Relative: 0 %
Eosinophils Absolute: 0 10*3/uL (ref 0.0–0.7)
Eosinophils Relative: 0 %
HCT: 37 % (ref 36.0–46.0)
Hemoglobin: 12.5 g/dL (ref 12.0–15.0)
Lymphocytes Relative: 15 %
Lymphs Abs: 1.6 10*3/uL (ref 0.7–4.0)
MCH: 29.8 pg (ref 26.0–34.0)
MCHC: 33.8 g/dL (ref 30.0–36.0)
MCV: 88.1 fL (ref 78.0–100.0)
Monocytes Absolute: 0.7 10*3/uL (ref 0.1–1.0)
Monocytes Relative: 6 %
Neutro Abs: 8.5 10*3/uL — ABNORMAL HIGH (ref 1.7–7.7)
Neutrophils Relative %: 79 %
Platelets: 192 10*3/uL (ref 150–400)
RBC: 4.2 MIL/uL (ref 3.87–5.11)
RDW: 13 % (ref 11.5–15.5)
WBC: 10.9 10*3/uL — ABNORMAL HIGH (ref 4.0–10.5)

## 2016-11-04 LAB — I-STAT CHEM 8, ED
BUN: 14 mg/dL (ref 6–20)
Calcium, Ion: 0.99 mmol/L — ABNORMAL LOW (ref 1.15–1.40)
Chloride: 103 mmol/L (ref 101–111)
Creatinine, Ser: 0.7 mg/dL (ref 0.44–1.00)
Glucose, Bld: 152 mg/dL — ABNORMAL HIGH (ref 65–99)
HCT: 39 % (ref 36.0–46.0)
Hemoglobin: 13.3 g/dL (ref 12.0–15.0)
Potassium: 3.6 mmol/L (ref 3.5–5.1)
Sodium: 136 mmol/L (ref 135–145)
TCO2: 22 mmol/L (ref 0–100)

## 2016-11-04 LAB — I-STAT TROPONIN, ED: Troponin i, poc: 0 ng/mL (ref 0.00–0.08)

## 2016-11-04 LAB — D-DIMER, QUANTITATIVE (NOT AT ARMC): D-Dimer, Quant: 0.62 ug/mL-FEU — ABNORMAL HIGH (ref 0.00–0.50)

## 2016-11-04 LAB — BASIC METABOLIC PANEL
Anion gap: 12 (ref 5–15)
BUN: 11 mg/dL (ref 6–20)
CO2: 22 mmol/L (ref 22–32)
Calcium: 9.4 mg/dL (ref 8.9–10.3)
Chloride: 100 mmol/L — ABNORMAL LOW (ref 101–111)
Creatinine, Ser: 0.75 mg/dL (ref 0.44–1.00)
GFR calc Af Amer: >60 mL/min (ref >60)
GFR calc non Af Amer: >60 mL/min (ref >60)
Glucose, Bld: 150 mg/dL — ABNORMAL HIGH (ref 65–99)
Potassium: 3.6 mmol/L (ref 3.5–5.1)
Sodium: 134 mmol/L — ABNORMAL LOW (ref 135–145)

## 2016-11-04 MED ORDER — AMOXICILLIN-POT CLAVULANATE 875-125 MG PO TABS
1.0000 | ORAL_TABLET | Freq: Once | ORAL | Status: AC
  Administered 2016-11-04: 1 via ORAL
  Filled 2016-11-04: qty 1

## 2016-11-04 MED ORDER — DICYCLOMINE HCL 10 MG/ML IM SOLN
20.0000 mg | Freq: Once | INTRAMUSCULAR | Status: AC
  Administered 2016-11-04: 20 mg via INTRAMUSCULAR
  Filled 2016-11-04: qty 2

## 2016-11-04 MED ORDER — GI COCKTAIL ~~LOC~~
30.0000 mL | Freq: Once | ORAL | Status: AC
  Administered 2016-11-04: 30 mL via ORAL
  Filled 2016-11-04: qty 30

## 2016-11-04 MED ORDER — AMOXICILLIN-POT CLAVULANATE 875-125 MG PO TABS: 1.0000 | ORAL_TABLET | Freq: Two times a day (BID) | ORAL | 0 refills | Status: DC

## 2016-11-04 MED ORDER — IOPAMIDOL (ISOVUE-370) INJECTION 76%
INTRAVENOUS | Status: AC
  Administered 2016-11-04: 100 mL
  Filled 2016-11-04: qty 100

## 2016-11-04 MED ORDER — KETOROLAC TROMETHAMINE 30 MG/ML IJ SOLN
30.0000 mg | Freq: Once | INTRAMUSCULAR | Status: AC
  Administered 2016-11-04: 30 mg via INTRAVENOUS
  Filled 2016-11-04: qty 1

## 2016-11-04 MED ORDER — KETOROLAC TROMETHAMINE 60 MG/2ML IM SOLN: 60.0000 mg | Freq: Once | INTRAMUSCULAR | Status: DC

## 2016-11-04 MED ORDER — IBUPROFEN 800 MG PO TABS: 800.0000 mg | ORAL_TABLET | Freq: Three times a day (TID) | ORAL | 0 refills | Status: DC

## 2016-11-04 NOTE — ED Triage Notes (Signed)
Pt complaining of central chest pain that radiates to bilateral neck and arms x 6 hrs. Pt states hx of same, told was dehydrated. Pt denies any SOB or N/V.

## 2016-11-04 NOTE — ED Notes (Signed)
In room to reassess pain.  Reports pain is still at 10/10 after being woke up.  Falling back to sleep quickly.

## 2016-11-04 NOTE — ED Notes (Signed)
Delay in lab draw pt not in room. 

## 2016-11-04 NOTE — ED Provider Notes (Signed)
[facility] DEPT Provider Note   CSN: [id number] Arrival date & time: 11/04/16  0208  By signing my name below, I, Reola Mosher, attest that this documentation has been prepared under the direction and in the presence of Herberth Deharo, MD. Electronically Signed: Reola Mosher, ED Scribe. 11/04/16. 2:35 AM.  History   Chief Complaint Chief Complaint  Patient presents with  . Chest Pain   The history is provided by the patient and medical records. No language interpreter was used.  Chest Pain   This is a new problem. The current episode started 6 to 12 hours ago. The problem occurs constantly. The problem has not changed since onset.The pain is associated with rest. The pain is present in the substernal region. The pain is at a severity of 10/10. The quality of the pain is described as sharp. The pain does not radiate. Pertinent negatives include no abdominal pain, no diaphoresis, no fever, no nausea, no palpitations, no shortness of breath and no vomiting. Risk factors include obesity.  Pertinent negatives for past medical history include no aneurysm, no Marfan's syndrome and no MI.  Pertinent negatives for family medical history include: no aortic dissection and no early MI.  Procedure history is negative for cardiac catheterization.    HPI Comments: Rebekah [name] is a 61 y.o. female with a h/o HTN,  who presents to the Emergency Department complaining of non-radiating, sharp centralized chest pain beginning seved hours ago. She notes radiation of her pain into her bilateral neck and arms. Per pt, she was at rest when her pain began and was not performed exertional activities. Per pt, she had just eat fatty foods prior to the onset of her pain. Per pt, she has a h/o similar pain which she was seen for the ED for in ~7 months ago. Dissection, PE were ruled out, pain was ruled as MSK at that time. No alleviating or exacerbating factors are noted. No treatments for her  symptoms were tried prior to coming into the ED. Pt has had normal PO intake prior to that onset of her symptoms. No h/o hiatal hernia. No Hx of PE/DVT, recent long travel, surgery, fracture, prolonged immobilization, hormone use.  She denies shortness of breath, nausea, vomiting, abdominal pain, fevers, leg swelling, wheezing or any other associated symptoms.   Past Medical History:  Diagnosis Date  . Hypertension    There are no active problems to display for this patient.  Past Surgical History:  Procedure Laterality Date  . ABDOMINAL HYSTERECTOMY    . biopsy on tongue  1976  . CESAREAN SECTION    . TUBAL LIGATION     OB History    No data available     Home Medications    Prior to Admission medications   Medication Sig Start Date End Date Taking? Authorizing Provider  cholecalciferol (VITAMIN D) 1000 units tablet Take 1,000 Units by mouth daily.    Historical Provider, MD  estradiol (ESTRACE) 1 MG tablet Take 1 mg by mouth daily.    Historical Provider, MD  hydrochlorothiazide (HYDRODIURIL) 25 MG tablet Take 25 mg by mouth daily.    Historical Provider, MD  HYDROcodone-acetaminophen (NORCO/VICODIN) 5-325 MG tablet Take 1-2 tablets by mouth every 6 (six) hours as needed for severe pain. 04/22/16   Blanchie Dessert, MD  leflunomide (ARAVA) 20 MG tablet Take 20 mg by mouth daily. 01/12/16   Historical Provider, MD  lubiprostone (AMITIZA) 24 MCG capsule Take 24 mcg by mouth daily. 08/07/14  Historical Provider, MD  Multiple Vitamin (MULTIVITAMIN) capsule Take 1 capsule by mouth daily.    Historical Provider, MD  naproxen (NAPROSYN) 500 MG tablet Take 1 tablet (500 mg total) by mouth 2 (two) times daily. 04/22/16   Blanchie Dessert, MD   Family History History reviewed. No pertinent family history.  Social History Social History  Substance Use Topics  . Smoking status: Former Smoker    Quit date: 09/11/2010  . Smokeless tobacco: Never Used  . Alcohol use Yes     Comment: monthly     Allergies   Patient has no known allergies.  Review of Systems Review of Systems  Constitutional: Negative for appetite change, chills, diaphoresis and fever.  HENT: Negative for drooling and facial swelling.   Eyes: Negative for photophobia.  Respiratory: Negative for shortness of breath.   Cardiovascular: Positive for chest pain. Negative for palpitations and leg swelling.  Gastrointestinal: Negative for abdominal pain, anal bleeding, nausea and vomiting.  Genitourinary: Negative for difficulty urinating.  Neurological: Negative for facial asymmetry and speech difficulty.  Psychiatric/Behavioral: Negative for suicidal ideas.  All other systems reviewed and are negative.  Physical Exam Updated Vital Signs BP (!) 175/94 (BP Location: Right Arm)   Pulse 92   Temp 99.6 F (37.6 C) (Oral)   Resp 16   Ht 5\' 6"  (1.676 m)   Wt 230 lb (104.3 kg)   SpO2 96%   BMI 37.12 kg/m   Physical Exam  Constitutional: She appears well-developed and well-nourished.  HENT:  Head: Normocephalic.  Mouth/Throat: Oropharynx is clear and moist. No oropharyngeal exudate.  Eyes: Conjunctivae and EOM are normal. Pupils are equal, round, and reactive to light. Right eye exhibits no discharge. Left eye exhibits no discharge. No scleral icterus.  Neck: Normal range of motion. Neck supple. No JVD present. No tracheal deviation present.  Trachea is midline. No stridor or carotid bruits.   Cardiovascular: Normal rate, regular rhythm, normal heart sounds and intact distal pulses.   No murmur heard. Pulmonary/Chest: Effort normal and breath sounds normal. No stridor. No respiratory distress. She has no wheezes. She has no rales.  Lungs CTA bilaterally.  Abdominal: Soft. She exhibits no distension. Bowel sounds are increased. There is no tenderness. There is no rebound and no guarding.  Gassy throughout.   Musculoskeletal: Normal range of motion. She exhibits no edema or tenderness.  All compartments are  soft. No palpable cords. No edema.   Lymphadenopathy:    She has no cervical adenopathy.  Neurological: She is alert. She has normal reflexes. She displays normal reflexes. She exhibits normal muscle tone.  Skin: Skin is warm and dry. Capillary refill takes less than 2 seconds.  Psychiatric: She has a normal mood and affect. Her behavior is normal.  Nursing note and vitals reviewed.  ED Treatments / Results  DIAGNOSTIC STUDIES: Oxygen Saturation is 96% on RA, normal by my interpretation.   COORDINATION OF CARE: 2:30 AM-Discussed next steps with pt. Pt verbalized understanding and is agreeable with the plan.   Results for orders placed or performed during the hospital encounter of 56/21/30  Basic metabolic panel  Result Value Ref Range   Sodium 137 135 - 145 mmol/L   Potassium 2.8 (L) 3.5 - 5.1 mmol/L   Chloride 97 (L) 101 - 111 mmol/L   CO2 30 22 - 32 mmol/L   Glucose, Bld 159 (H) 65 - 99 mg/dL   BUN 21 (H) 6 - 20 mg/dL   Creatinine, Ser 0.97 0.44 -  1.00 mg/dL   Calcium 9.5 8.9 - 10.3 mg/dL   GFR calc non Af Amer >60 >60 mL/min   GFR calc Af Amer >60 >60 mL/min   Anion gap 10 5 - 15  CBC  Result Value Ref Range   WBC 7.2 4.0 - 10.5 K/uL   RBC 4.19 3.87 - 5.11 MIL/uL   Hemoglobin 12.3 12.0 - 15.0 g/dL   HCT 37.1 36.0 - 46.0 %   MCV 88.5 78.0 - 100.0 fL   MCH 29.4 26.0 - 34.0 pg   MCHC 33.2 30.0 - 36.0 g/dL   RDW 13.3 11.5 - 15.5 %   Platelets 225 150 - 400 K/uL  D-dimer, quantitative (not at [facility])  Result Value Ref Range   D-Dimer, Quant 0.42 0.00 - 0.50 ug/mL-FEU  I-stat troponin, ED  Result Value Ref Range   Troponin i, poc 0.01 0.00 - 0.08 ng/mL   Comment 3          I-stat troponin, ED  Result Value Ref Range   Troponin i, poc 0.00 0.00 - 0.08 ng/mL   Comment 3           No results found.   EKG Interpretation  Date/Time:  Sunday November 04 2016 02:16:39 EDT Ventricular Rate:  92 PR Interval:    QRS Duration: 90 QT Interval:  352 QTC Calculation: 436 R  Axis:   2 Text Interpretation:  Sinus rhythm Left ventricular hypertrophy Confirmed by [facility]  MD, Emmaline Kluver ([id number]) on 11/04/2016 2:20:49 AM      Procedures Procedures   Medications Ordered in ED Medications - No data to display  Final Clinical Impressions(s) / ED Diagnoses   Final diagnoses:  None   This is a 61 y.o. -year-old female presents with substernal chest pain. The patient is nontoxic-appearing on exam and vital signs are within normal limits.   EKG normal. Trop negative. CXR negative. Ruled out for PE give pain with elevated wbc and borderling temperature will treat for PNA.  Follow up with your PMD in 2 days for recheck.  Strict return precautions for shortness of breath, persistent chest pain,  Dyspnea on exertions, fevers > 101 weakness lethargy or any concerns.    After history, exam, and medical workup I feel the patient has been appropriately medically screened and is safe for discharge home. Pertinent diagnoses were discussed with the patient. Patient was given return precautions.  I personally performed the services described in this documentation, which was scribed in my presence. The recorded information has been reviewed and is accurate.       Veatrice Kells, MD 11/04/16 [phone]

## 2016-11-05 DIAGNOSIS — Z8701 Personal history of pneumonia (recurrent): Secondary | ICD-10-CM | POA: Diagnosis not present

## 2016-11-05 DIAGNOSIS — R740 Nonspecific elevation of levels of transaminase and lactic acid dehydrogenase [LDH]: Secondary | ICD-10-CM | POA: Diagnosis not present

## 2016-11-05 DIAGNOSIS — Z79899 Other long term (current) drug therapy: Secondary | ICD-10-CM | POA: Diagnosis not present

## 2016-11-05 DIAGNOSIS — M069 Rheumatoid arthritis, unspecified: Secondary | ICD-10-CM | POA: Diagnosis not present

## 2016-11-08 DIAGNOSIS — J189 Pneumonia, unspecified organism: Secondary | ICD-10-CM | POA: Diagnosis not present

## 2016-11-29 DIAGNOSIS — L259 Unspecified contact dermatitis, unspecified cause: Secondary | ICD-10-CM | POA: Diagnosis not present

## 2017-02-05 DIAGNOSIS — L309 Dermatitis, unspecified: Secondary | ICD-10-CM | POA: Diagnosis not present

## 2017-04-19 ENCOUNTER — Other Ambulatory Visit: Payer: Self-pay | Admitting: Family Medicine

## 2017-04-19 DIAGNOSIS — E041 Nontoxic single thyroid nodule: Secondary | ICD-10-CM | POA: Diagnosis not present

## 2017-04-19 DIAGNOSIS — Z1211 Encounter for screening for malignant neoplasm of colon: Secondary | ICD-10-CM | POA: Diagnosis not present

## 2017-04-19 DIAGNOSIS — E119 Type 2 diabetes mellitus without complications: Secondary | ICD-10-CM | POA: Diagnosis not present

## 2017-04-19 DIAGNOSIS — Z23 Encounter for immunization: Secondary | ICD-10-CM | POA: Diagnosis not present

## 2017-05-01 ENCOUNTER — Other Ambulatory Visit: Payer: Self-pay | Admitting: Obstetrics and Gynecology

## 2017-05-01 DIAGNOSIS — Z6838 Body mass index (BMI) 38.0-38.9, adult: Secondary | ICD-10-CM | POA: Diagnosis not present

## 2017-05-01 DIAGNOSIS — Z01419 Encounter for gynecological examination (general) (routine) without abnormal findings: Secondary | ICD-10-CM | POA: Diagnosis not present

## 2017-05-01 DIAGNOSIS — Z1231 Encounter for screening mammogram for malignant neoplasm of breast: Secondary | ICD-10-CM

## 2017-05-28 ENCOUNTER — Emergency Department (HOSPITAL_COMMUNITY): Payer: Federal, State, Local not specified - PPO

## 2017-05-28 ENCOUNTER — Encounter (HOSPITAL_COMMUNITY): Payer: Self-pay

## 2017-05-28 ENCOUNTER — Emergency Department (HOSPITAL_COMMUNITY)
Admission: EM | Admit: 2017-05-28 | Discharge: 2017-05-28 | Disposition: A | Discharge status: Home / Self Care | Payer: Federal, State, Local not specified - PPO | Attending: Physician Assistant | Admitting: Physician Assistant

## 2017-05-28 DIAGNOSIS — R9431 Abnormal electrocardiogram [ECG] [EKG]: Secondary | ICD-10-CM | POA: Insufficient documentation

## 2017-05-28 DIAGNOSIS — R0789 Other chest pain: Secondary | ICD-10-CM | POA: Insufficient documentation

## 2017-05-28 DIAGNOSIS — R61 Generalized hyperhidrosis: Secondary | ICD-10-CM | POA: Insufficient documentation

## 2017-05-28 DIAGNOSIS — R0602 Shortness of breath: Secondary | ICD-10-CM | POA: Diagnosis not present

## 2017-05-28 DIAGNOSIS — I10 Essential (primary) hypertension: Secondary | ICD-10-CM | POA: Diagnosis not present

## 2017-05-28 DIAGNOSIS — Z79899 Other long term (current) drug therapy: Secondary | ICD-10-CM | POA: Insufficient documentation

## 2017-05-28 DIAGNOSIS — R079 Chest pain, unspecified: Secondary | ICD-10-CM

## 2017-05-28 DIAGNOSIS — Z87891 Personal history of nicotine dependence: Secondary | ICD-10-CM | POA: Diagnosis not present

## 2017-05-28 DIAGNOSIS — R112 Nausea with vomiting, unspecified: Secondary | ICD-10-CM | POA: Insufficient documentation

## 2017-05-28 DIAGNOSIS — Z7982 Long term (current) use of aspirin: Secondary | ICD-10-CM | POA: Diagnosis not present

## 2017-05-28 LAB — BASIC METABOLIC PANEL
Anion gap: 8 (ref 5–15)
BUN: 19 mg/dL (ref 6–20)
CO2: 27 mmol/L (ref 22–32)
Calcium: 9.2 mg/dL (ref 8.9–10.3)
Chloride: 103 mmol/L (ref 101–111)
Creatinine, Ser: 0.84 mg/dL (ref 0.44–1.00)
GFR calc Af Amer: >60 mL/min (ref >60)
GFR calc non Af Amer: >60 mL/min (ref >60)
Glucose, Bld: 147 mg/dL — ABNORMAL HIGH (ref 65–99)
Potassium: 3.7 mmol/L (ref 3.5–5.1)
Sodium: 138 mmol/L (ref 135–145)

## 2017-05-28 LAB — I-STAT TROPONIN, ED
Troponin i, poc: 0 ng/mL (ref 0.00–0.08)
Troponin i, poc: 0.01 ng/mL (ref 0.00–0.08)

## 2017-05-28 LAB — CBC
HCT: 36.8 % (ref 36.0–46.0)
Hemoglobin: 12 g/dL (ref 12.0–15.0)
MCH: 29.1 pg (ref 26.0–34.0)
MCHC: 32.6 g/dL (ref 30.0–36.0)
MCV: 89.3 fL (ref 78.0–100.0)
Platelets: 219 10*3/uL (ref 150–400)
RBC: 4.12 MIL/uL (ref 3.87–5.11)
RDW: 13.7 % (ref 11.5–15.5)
WBC: 9.7 10*3/uL (ref 4.0–10.5)

## 2017-05-28 MED ORDER — IOPAMIDOL (ISOVUE-370) INJECTION 76%
INTRAVENOUS | Status: AC
  Administered 2017-05-28: 100 mL
  Filled 2017-05-28: qty 100

## 2017-05-28 NOTE — ED Triage Notes (Signed)
Patient complains of CP since 9pm last night. Sent from [facility] urgent [facility] for further evaluation. Denies any associated symptoms. Alert and oiented

## 2017-05-28 NOTE — ED Provider Notes (Signed)
[place] EMERGENCY DEPARTMENT Provider Note   CSN: [id number] Arrival date & time: 05/28/17  1111     History   Chief Complaint No chief complaint on file.   HPI Rebekah [name] is a 61 y.o. female with history of HTN who presents to the ED from urgent care with chest pain and abnormal EKG. Patient states has been in her usual cerebellopontine last night which started experiencing pressure-like substernal chest pain. Pain is nonradiating associated with shortness of breath, nausea, vomiting, or diaphoresis. No association to food intake. Does have a cardiac history. Has not taken any medications for it. States it worsens when she lays flat in bed and improved when she leans forward. States she flew from [facility] yesterday. She is on HRT. Does not smoke. Denies recent illness, fever, chills, shortness of breath, abdominal pain, nausea, vomiting, or changes in bowel movements.  HPI  Past Medical History:  Diagnosis Date  . Hypertension     There are no active problems to display for this patient.   Past Surgical History:  Procedure Laterality Date  . ABDOMINAL HYSTERECTOMY    . biopsy on tongue  1976  . CESAREAN SECTION    . TUBAL LIGATION      OB History    No data available       Home Medications    Prior to Admission medications   Medication Sig Start Date End Date Taking? Authorizing Provider  aspirin 81 MG chewable tablet Chew 81 mg by mouth daily.   Yes [provider]  estradiol (ESTRACE) 1 MG tablet Take 1 mg by mouth daily.   Yes [provider]  hydrochlorothiazide (HYDRODIURIL) 25 MG tablet Take 25 mg by mouth daily.   Yes [provider]  ibuprofen (ADVIL,MOTRIN) 800 MG tablet Take 1 tablet (800 mg total) by mouth 3 (three) times daily. 11/04/16  Yes Palumbo, April, MD  leflunomide (ARAVA) 20 MG tablet Take 20 mg by mouth daily. 01/12/16  Yes [provider]  lubiprostone (AMITIZA) 24 MCG capsule  Take 24 mcg by mouth 2 (two) times daily as needed.   Yes [provider]  rosuvastatin (CRESTOR) 5 MG tablet Take 5 mg by mouth daily. 04/22/17  Yes [provider]  HYDROcodone-acetaminophen (NORCO/VICODIN) 5-325 MG tablet Take 1-2 tablets by mouth every 6 (six) hours as needed for severe pain. Patient not taking: Reported on 11/04/2016 04/22/16   Blanchie Dessert, MD  naproxen (NAPROSYN) 500 MG tablet Take 1 tablet (500 mg total) by mouth 2 (two) times daily. Patient not taking: Reported on 11/04/2016 04/22/16   Blanchie Dessert, MD    Family History No family history on file.  Social History Social History  Substance Use Topics  . Smoking status: Former Smoker    Quit date: 09/11/2010  . Smokeless tobacco: Never Used  . Alcohol use Yes     Comment: monthly      Allergies   Patient has no known allergies.   Review of Systems Review of Systems  Constitutional: Negative for activity change, appetite change, chills, diaphoresis, fatigue and fever.  Respiratory: Negative for cough, choking, chest tightness and shortness of breath.   Cardiovascular: Positive for chest pain. Negative for palpitations and leg swelling.  Gastrointestinal: Negative for abdominal distention, abdominal pain, blood in stool, constipation, diarrhea and vomiting.  Genitourinary: Negative for difficulty urinating.  Musculoskeletal: Negative for arthralgias, joint swelling and myalgias.  All other systems reviewed and are negative.    Physical  Exam Updated Vital Signs BP 130/77   Pulse 94   Temp 99 F (37.2 C) (Oral)   Resp (!) 27   Ht 5\' 5"  (1.651 m)   Wt 104.8 kg (231 lb)   SpO2 97%   BMI 38.44 kg/m   Physical Exam  Constitutional: Vital signs are normal. She appears well-developed and well-nourished. She is cooperative.  Non-toxic appearance. She does not appear ill. No distress.  Cardiovascular: Normal rate, regular rhythm, S1 normal, S2 normal and intact distal pulses.  Exam  reveals no friction rub.   No murmur heard. Pulmonary/Chest: Effort normal and breath sounds normal. No accessory muscle usage. No respiratory distress.  Abdominal: Soft. Bowel sounds are normal. There is no tenderness. There is no rebound and no guarding.  Neurological: She is alert.  Ext: warm and well perfused, no calf tenderness bilaterally, no LE pitting edema bilaterally     ED Treatments / Results  Labs (all labs ordered are listed, but only abnormal results are displayed) Labs Reviewed  BASIC METABOLIC PANEL - Abnormal; Notable for the following:       Result Value   Glucose, Bld 147 (*)    All other components within normal limits  CBC  I-STAT TROPONIN, ED    EKG  EKG Interpretation  Date/Time:  Tuesday May 28 2017 11:14:13 EDT Ventricular Rate:  96 PR Interval:  152 QRS Duration: 82 QT Interval:  348 QTC Calculation: 439 R Axis:   7 Text Interpretation:  Normal sinus rhythm Moderate voltage criteria for LVH, may be normal variant ST elevation, consider early repolarization Borderline ECG No significant change since last tracing Confirmed by [place], [place] [phone]) on 05/28/2017 11:49:24 AM       Radiology Dg Chest 2 View  Result Date: 05/28/2017 CLINICAL DATA:  Chest pain and tightness over the last 12 hours EXAM: CHEST  2 VIEW COMPARISON:  Chest x-ray of 11/04/2016 FINDINGS: The lungs are not well aerated with mild basilar atelectasis present. A tiny left pleural effusion cannot be excluded. No pneumonia is seen. Mediastinal and hilar contours are unchanged and the heart remains mildly enlarged. There are degenerative changes in the lower thoracic spine. IMPRESSION: Poor inspiration with bibasilar atelectasis and questionable tiny left pleural effusion. Electronically Signed   By: Ivar Drape M.D.   On: 05/28/2017 11:32    Procedures Procedures (including critical care time)  Medications Ordered in ED Medications - No data to display   Initial  Impression / Assessment and Plan / ED Course  I have reviewed the triage vital signs and the nursing notes.  Pertinent labs & imaging results that were available during my care of the patient were reviewed by me and considered in my medical decision making (see chart for details).     Danene L Massman is a 61 y.o. female with history of HTN who presents to the ED from urgent care with chest pain and abnormal EKG. Patient is having substernal chest pain that improves with leaning forward. Hemodynamically stable. EKG with diffuse ST elevations, but troponin negative. DDx: PE, pericarditis, ACS. CTA chest ordered to evaluate for PE.  1355 Reassessed patient. She is sleeping comfortably in bed in no acute distress. Husband in the room with her. Awaiting for CTA.   37 CTA negative for PE. Will order second troponin and plan to d/c home if negative.    Case discussed with Dr. Thomasene Lot.   Final Clinical Impressions(s) / ED Diagnoses   Final diagnoses:  Chest pain, unspecified  type    New Prescriptions New Prescriptions   No medications on file     Welford Roche, MD 05/28/17 1510    Macarthur Critchley, MD 05/28/17 1536

## 2017-05-28 NOTE — Discharge Instructions (Signed)
You were seen at [facility] in the ED for chest pain. We did a CT of your chest that looked normal and showed you did not have a clot in your lungs. You can take Aleve every 8 hours as needed for pain. Please make an appointment with your PCP. He can provide a different medication for pain if you need it and refer you to a cardiologist if necessary.

## 2017-05-28 NOTE — ED Notes (Signed)
Patient transported to CT 

## 2017-05-30 DIAGNOSIS — M069 Rheumatoid arthritis, unspecified: Secondary | ICD-10-CM | POA: Diagnosis not present

## 2017-05-31 ENCOUNTER — Ambulatory Visit
Admission: RE | Admit: 2017-05-31 | Discharge: 2017-05-31 | Disposition: A | Discharge status: Home / Self Care | Payer: Federal, State, Local not specified - PPO | Source: Ambulatory Visit | Attending: Family Medicine | Admitting: Family Medicine

## 2017-05-31 ENCOUNTER — Ambulatory Visit
Admission: RE | Admit: 2017-05-31 | Discharge: 2017-05-31 | Disposition: A | Discharge status: Home / Self Care | Payer: Federal, State, Local not specified - PPO | Source: Ambulatory Visit | Attending: Obstetrics and Gynecology | Admitting: Obstetrics and Gynecology

## 2017-05-31 DIAGNOSIS — Z1231 Encounter for screening mammogram for malignant neoplasm of breast: Secondary | ICD-10-CM | POA: Diagnosis not present

## 2017-05-31 DIAGNOSIS — E042 Nontoxic multinodular goiter: Secondary | ICD-10-CM | POA: Diagnosis not present

## 2017-05-31 DIAGNOSIS — E041 Nontoxic single thyroid nodule: Secondary | ICD-10-CM

## 2017-06-14 DIAGNOSIS — E7800 Pure hypercholesterolemia, unspecified: Secondary | ICD-10-CM | POA: Diagnosis not present

## 2017-06-21 DIAGNOSIS — Z01818 Encounter for other preprocedural examination: Secondary | ICD-10-CM | POA: Diagnosis not present

## 2017-07-03 DIAGNOSIS — I10 Essential (primary) hypertension: Secondary | ICD-10-CM | POA: Diagnosis not present

## 2017-07-03 DIAGNOSIS — R079 Chest pain, unspecified: Secondary | ICD-10-CM | POA: Diagnosis not present

## 2017-07-03 DIAGNOSIS — E7800 Pure hypercholesterolemia, unspecified: Secondary | ICD-10-CM | POA: Diagnosis not present

## 2017-07-12 DIAGNOSIS — K635 Polyp of colon: Secondary | ICD-10-CM | POA: Diagnosis not present

## 2017-07-12 DIAGNOSIS — D126 Benign neoplasm of colon, unspecified: Secondary | ICD-10-CM | POA: Diagnosis not present

## 2017-07-12 DIAGNOSIS — Z1211 Encounter for screening for malignant neoplasm of colon: Secondary | ICD-10-CM | POA: Diagnosis not present

## 2017-07-16 DIAGNOSIS — Z1211 Encounter for screening for malignant neoplasm of colon: Secondary | ICD-10-CM | POA: Diagnosis not present

## 2017-07-16 DIAGNOSIS — K635 Polyp of colon: Secondary | ICD-10-CM | POA: Diagnosis not present

## 2017-07-16 DIAGNOSIS — D126 Benign neoplasm of colon, unspecified: Secondary | ICD-10-CM | POA: Diagnosis not present

## 2017-07-19 DIAGNOSIS — I10 Essential (primary) hypertension: Secondary | ICD-10-CM | POA: Diagnosis not present

## 2017-09-10 DIAGNOSIS — H40023 Open angle with borderline findings, high risk, bilateral: Secondary | ICD-10-CM | POA: Diagnosis not present

## 2017-10-10 ENCOUNTER — Encounter: Payer: Self-pay | Admitting: Cardiovascular Disease

## 2017-10-10 ENCOUNTER — Ambulatory Visit (INDEPENDENT_AMBULATORY_CARE_PROVIDER_SITE_OTHER): Payer: Federal, State, Local not specified - PPO | Admitting: Cardiovascular Disease

## 2017-10-10 VITALS — BP 142/78 | HR 65 | Ht 65.0 in | Wt 231.2 lb

## 2017-10-10 DIAGNOSIS — E7800 Pure hypercholesterolemia, unspecified: Secondary | ICD-10-CM

## 2017-10-10 DIAGNOSIS — R079 Chest pain, unspecified: Secondary | ICD-10-CM | POA: Diagnosis not present

## 2017-10-10 DIAGNOSIS — E1169 Type 2 diabetes mellitus with other specified complication: Secondary | ICD-10-CM | POA: Insufficient documentation

## 2017-10-10 DIAGNOSIS — E669 Obesity, unspecified: Secondary | ICD-10-CM | POA: Diagnosis not present

## 2017-10-10 DIAGNOSIS — Z6835 Body mass index (BMI) 35.0-35.9, adult: Secondary | ICD-10-CM

## 2017-10-10 DIAGNOSIS — I10 Essential (primary) hypertension: Secondary | ICD-10-CM | POA: Insufficient documentation

## 2017-10-10 HISTORY — DX: Type 2 diabetes mellitus with other specified complication: E66.9

## 2017-10-10 HISTORY — DX: Pure hypercholesterolemia, unspecified: E78.00

## 2017-10-10 HISTORY — DX: Essential (primary) hypertension: I10

## 2017-10-10 HISTORY — DX: Type 2 diabetes mellitus with other specified complication: E11.69

## 2017-10-10 HISTORY — DX: Morbid (severe) obesity due to excess calories: E66.01

## 2017-10-10 MED ORDER — AMLODIPINE BESYLATE 5 MG PO TABS: 5.0000 mg | ORAL_TABLET | Freq: Every day | ORAL | 3 refills | Status: DC

## 2017-10-10 NOTE — Patient Instructions (Addendum)
Medication Instructions:  STOP BYSTOLIC   START AMLODIPINE 5 MG DAILY   Labwork: NONE  Testing/Procedures: Your physician has requested that you have an exercise tolerance test. For further information please visit [email or web address]. Please also follow instruction sheet, as given.  Follow-Up: Your physician recommends that you schedule a follow-up appointment in: 6 WEEKS WITH DR Kerhonkson PA ON A DAY SHE IS IN THE OFFICE. IF YOU PREFER WAITING AND SEEING DR Happy IT MAY BE LONGER THAN 6 WEEKS   If you need a refill on your cardiac medications before your next appointment, please call your pharmacy.   Exercise Stress Electrocardiogram An exercise stress electrocardiogram is a test to check how blood flows to your heart. It is done to find areas of poor blood flow. You will need to walk on a treadmill for this test. The electrocardiogram will record your heartbeat when you are at rest and when you are exercising. What happens before the procedure?  Do not have drinks with caffeine or foods with caffeine for 24 hours before the test, or as told by your doctor. This includes coffee, tea (even decaf tea), sodas, chocolate, and cocoa.  Follow your doctor's instructions about eating and drinking before the test.  Ask your doctor what medicines you should or should not take before the test. Take your medicines with water unless told by your doctor not to.  If you use an inhaler, bring it with you to the test.  Bring a snack to eat after the test.  Do not  smoke for 4 hours before the test.  Do not put lotions, powders, creams, or oils on your chest before the test.  Wear comfortable shoes and clothing. What happens during the procedure?  You will have patches put on your chest. Small areas of your chest may need to be shaved. Wires will be connected to the patches.  Your heart rate will be watched while you are resting and while you are exercising.  You will walk on the  treadmill. The treadmill will slowly get faster to raise your heart rate.  The test will take about 1-2 hours. What happens after the procedure?  Your heart rate and blood pressure will be watched after the test.  You may return to your normal diet, activities, and medicines or as told by your doctor. This information is not intended to replace advice given to you by your health care provider. Make sure you discuss any questions you have with your health care provider. Document Released: 01/16/2008 Document Revised: 03/28/2016 Document Reviewed: 04/06/2013 Elsevier Interactive Patient Education  [employer].

## 2017-10-10 NOTE — Progress Notes (Signed)
Cardiology Office Note   Date:  10/10/2017   ID:  Rebekah [name], DOB 09-09-1955, MRN [id number]  PCP:  Jonathon Jordan, MD  Cardiologist:   Skeet Latch, MD   No chief complaint on file.     History of Present Illness: Rebekah [name] is a 62 y.o. female with hypertension, hyperlipidemia, Rheumatoid arthritis and diabetes who is being seen today for the evaluation of chest pain at the request of Jonathon Jordan, MD.  She initially presented to urgent care 05/2017 with chest pain.  At the time she reported exertional chest pain that occurred while walking on the treadmill or walking for exercise.  The episodes lasted for several hours at a time.  She felt as though her at the center of her chest hurt when she tried to take a deep breath.  There is no associated nausea or diaphoresis.  She was referred to the emergency department where her EKG revealed LVH with repolarization abnormalities versus lateral ST elevations.  Cardiac enzymes were negative.  D-dimer was mildly elevated and she had recently flown across the country.  She had a chest CT that was negative for pulmonary embolism.  She followed up with Dr. Stephanie Acre and was referred to cardiology given her risk factors.  Since that time she has not had any recurrent chest pain.  She has not noted any lower extremity edema, orthopnea, or PND.  She exercises at work twice per week doing a step class.  She hopes to start back walking up to 5 miles on the other days.  She denies palpitations, lightheadedness, or dizziness.  She did take her blood pressure medication already this morning.  She notes that it was well-controlled when she last saw her PCP but does not check it at home.   Past Medical History:  Diagnosis Date  . Diabetes mellitus type 2 in obese ([place]) 10/10/2017  . Essential hypertension 10/10/2017  . Hypertension   . Morbid obesity ([place]) 10/10/2017  . Pure hypercholesterolemia 10/10/2017    Past Surgical History:    Procedure Laterality Date  . ABDOMINAL HYSTERECTOMY    . biopsy on tongue  1976  . BREAST EXCISIONAL BIOPSY Left   . CESAREAN SECTION    . TUBAL LIGATION       Current Outpatient Medications  Medication Sig Dispense Refill  . aspirin 81 MG chewable tablet Chew 81 mg by mouth daily.    Marland Kitchen estradiol (ESTRACE) 1 MG tablet Take 1 mg by mouth daily.    . hydrochlorothiazide (HYDRODIURIL) 25 MG tablet Take 25 mg by mouth daily.    Marland Kitchen HYDROcodone-acetaminophen (NORCO/VICODIN) 5-325 MG tablet Take 1-2 tablets by mouth every 6 (six) hours as needed for severe pain. 10 tablet 0  . ibuprofen (ADVIL,MOTRIN) 800 MG tablet Take 1 tablet (800 mg total) by mouth 3 (three) times daily. 21 tablet 0  . leflunomide (ARAVA) 20 MG tablet Take 20 mg by mouth daily.    Marland Kitchen lubiprostone (AMITIZA) 24 MCG capsule Take 24 mcg by mouth 2 (two) times daily as needed.    . naproxen (NAPROSYN) 500 MG tablet Take 1 tablet (500 mg total) by mouth 2 (two) times daily. 20 tablet 0  . rosuvastatin (CRESTOR) 5 MG tablet Take 5 mg by mouth daily.  1  . amLODipine (NORVASC) 5 MG tablet Take 1 tablet (5 mg total) by mouth daily. 90 tablet 3   No current facility-administered medications for this visit.     Allergies:   Crestor [  rosuvastatin]    Social History:  The patient  reports that she quit smoking about 7 years ago. she has never used smokeless tobacco. She reports that she drinks alcohol.   Family History:  The patient's family history includes Alzheimer's disease in her maternal grandfather; CAD in her mother; CVA in her sister; Cancer in her paternal grandmother; Cerebral aneurysm in her father; Heart attack in her maternal grandmother; Stroke in her maternal grandmother and sister.    ROS:  Please see the history of present illness.   Otherwise, review of systems are positive for none.   All other systems are reviewed and negative.    PHYSICAL EXAM: VS:  BP (!) 142/78   Pulse 65   Ht 5\' 5"  (1.651 m)   Wt 231  lb 3.2 oz (104.9 kg)   BMI 38.47 kg/m  , BMI Body mass index is 38.47 kg/m. GENERAL:  Well appearing.  No acute distress HEENT:  Pupils equal round and reactive, fundi not visualized, oral mucosa unremarkable NECK:  No jugular venous distention, waveform within normal limits, carotid upstroke brisk and symmetric, no bruits LUNGS:  Clear to auscultation bilaterally HEART:  RRR.  PMI not displaced or sustained,S1 and S2 within normal limits, no S3, no S4, no clicks, no rubs, no murmurs ABD:  Flat, positive bowel sounds normal in frequency in pitch, no bruits, no rebound, no guarding, no midline pulsatile mass, no hepatomegaly, no splenomegaly EXT:  2 plus pulses throughout, no edema, no cyanosis no clubbing SKIN:  No rashes no nodules NEURO:  Cranial nerves II through XII grossly intact, motor grossly intact throughout PSYCH:  Cognitively intact, oriented to person place and time   EKG:  EKG is ordered today. The ekg ordered today demonstrates sinus rhythm.  Rate 65 bpm.   Recent Labs: 05/28/2017: BUN 19; Creatinine, Ser 0.84; Hemoglobin 12.0; Platelets 219; Potassium 3.7; Sodium 138   06/14/17: Total cholesterol 157, triglycerides 90, HDL 57, LDL 82  04/19/17: Hemoglobin A1c 6.3% TSH 1.19  Lipid Panel No results found for: CHOL, TRIG, HDL, CHOLHDL, VLDL, LDLCALC, LDLDIRECT    Wt Readings from Last 3 Encounters:  10/10/17 231 lb 3.2 oz (104.9 kg)  05/28/17 231 lb (104.8 kg)  11/04/16 230 lb (104.3 kg)      ASSESSMENT AND PLAN:  # Exertional chest pain: Symptoms are concerning for ischemia though she has not had any recurrent symptoms lately.  We will get an exercise treadmill stress test to evaluate for ischemia.  Continue aspirin.  # Hypertension: Her blood pressure was initially very elevated and improved on repeat but was still above goal.  She is unsure what her blood pressures are running at home.  We will stop nebivolol and start amlodipine 5 mg daily.  Continue  hydrochlorthiazide.  She will track her BP at home.  # Hyperlipidemia:  LDL 82 on 06/2017.  Continue rosuvastatin.  # Obesity: She will explore intermittent fasting and increase her exercise to 150 minutes per week.    Current medicines are reviewed at length with the patient today.  The patient does not have concerns regarding medicines.  The following changes have been made:  Stop nebivolol and start amlodipine.   Labs/ tests ordered today include:   Orders Placed This Encounter  Procedures  . Exercise Tolerance Test  . EKG 12-Lead     Disposition:   FU with Tylin Stradley C. Oval Linsey, MD, [facility] in 6-8 weeks.     This note was written with the assistance of  speech recognition software.  Please excuse any transcriptional errors.  Signed, Oneal Biglow C. Oval Linsey, MD, [facility]  10/10/2017 9:05 AM    [place]

## 2017-10-13 ENCOUNTER — Emergency Department (HOSPITAL_COMMUNITY)
Admission: EM | Admit: 2017-10-13 | Discharge: 2017-10-13 | Disposition: A | Discharge status: Home / Self Care | Payer: Federal, State, Local not specified - PPO | Attending: Emergency Medicine | Admitting: Emergency Medicine

## 2017-10-13 ENCOUNTER — Encounter (HOSPITAL_COMMUNITY): Payer: Self-pay | Admitting: Emergency Medicine

## 2017-10-13 ENCOUNTER — Other Ambulatory Visit: Payer: Self-pay

## 2017-10-13 DIAGNOSIS — Z7982 Long term (current) use of aspirin: Secondary | ICD-10-CM | POA: Insufficient documentation

## 2017-10-13 DIAGNOSIS — Z79899 Other long term (current) drug therapy: Secondary | ICD-10-CM | POA: Diagnosis not present

## 2017-10-13 DIAGNOSIS — E119 Type 2 diabetes mellitus without complications: Secondary | ICD-10-CM | POA: Diagnosis not present

## 2017-10-13 DIAGNOSIS — Z041 Encounter for examination and observation following transport accident: Secondary | ICD-10-CM | POA: Diagnosis not present

## 2017-10-13 DIAGNOSIS — M545 Low back pain: Secondary | ICD-10-CM | POA: Diagnosis not present

## 2017-10-13 DIAGNOSIS — Z87891 Personal history of nicotine dependence: Secondary | ICD-10-CM | POA: Insufficient documentation

## 2017-10-13 DIAGNOSIS — I10 Essential (primary) hypertension: Secondary | ICD-10-CM | POA: Insufficient documentation

## 2017-10-13 DIAGNOSIS — Z711 Person with feared health complaint in whom no diagnosis is made: Secondary | ICD-10-CM | POA: Insufficient documentation

## 2017-10-13 HISTORY — DX: Unspecified osteoarthritis, unspecified site: M19.90
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
## 2017-10-17 ENCOUNTER — Telehealth (HOSPITAL_COMMUNITY): Payer: Self-pay

## 2017-10-17 NOTE — Telephone Encounter (Signed)
Encounter complete. 

## 2017-10-22 ENCOUNTER — Ambulatory Visit (HOSPITAL_COMMUNITY)
Admission: RE | Admit: 2017-10-22 | Discharge: 2017-10-22 | Disposition: A | Discharge status: Home / Self Care | Payer: Federal, State, Local not specified - PPO | Source: Ambulatory Visit | Attending: Cardiovascular Disease | Admitting: Cardiovascular Disease

## 2017-10-22 DIAGNOSIS — R079 Chest pain, unspecified: Secondary | ICD-10-CM | POA: Insufficient documentation

## 2017-10-23 LAB — EXERCISE TOLERANCE TEST
Estimated workload: 9.6 METS
Exercise duration (min): 7 min
Exercise duration (sec): 41 s
MPHR: 160 {beats}/min
Peak HR: 166 {beats}/min
Percent HR: 103 %
RPE: 17
Rest HR: 84 {beats}/min

## 2017-11-20 DIAGNOSIS — K6389 Other specified diseases of intestine: Secondary | ICD-10-CM | POA: Diagnosis not present

## 2017-11-20 DIAGNOSIS — D126 Benign neoplasm of colon, unspecified: Secondary | ICD-10-CM | POA: Diagnosis not present

## 2017-11-20 DIAGNOSIS — Z86010 Personal history of colonic polyps: Secondary | ICD-10-CM | POA: Diagnosis not present

## 2017-11-20 DIAGNOSIS — K5730 Diverticulosis of large intestine without perforation or abscess without bleeding: Secondary | ICD-10-CM | POA: Diagnosis not present

## 2017-11-26 DIAGNOSIS — D126 Benign neoplasm of colon, unspecified: Secondary | ICD-10-CM | POA: Diagnosis not present

## 2017-12-03 DIAGNOSIS — Z79899 Other long term (current) drug therapy: Secondary | ICD-10-CM | POA: Diagnosis not present

## 2017-12-03 DIAGNOSIS — M069 Rheumatoid arthritis, unspecified: Secondary | ICD-10-CM | POA: Diagnosis not present

## 2017-12-11 ENCOUNTER — Encounter: Payer: Self-pay | Admitting: Cardiovascular Disease

## 2017-12-11 ENCOUNTER — Ambulatory Visit (INDEPENDENT_AMBULATORY_CARE_PROVIDER_SITE_OTHER): Payer: Federal, State, Local not specified - PPO | Admitting: Cardiovascular Disease

## 2017-12-11 VITALS — BP 140/80 | HR 76 | Ht 66.0 in | Wt 227.4 lb

## 2017-12-11 DIAGNOSIS — I10 Essential (primary) hypertension: Secondary | ICD-10-CM | POA: Diagnosis not present

## 2017-12-11 DIAGNOSIS — R079 Chest pain, unspecified: Secondary | ICD-10-CM

## 2017-12-11 DIAGNOSIS — E7800 Pure hypercholesterolemia, unspecified: Secondary | ICD-10-CM | POA: Diagnosis not present
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
## 2017-12-11 NOTE — Patient Instructions (Signed)
Medication Instructions:  Your physician recommends that you continue on your current medications as directed. Please refer to the Current Medication list given to you today.  Labwork: NONE  Testing/Procedures: NONE  Follow-Up: Your physician wants you to follow-up in: 1 [place] will receive a reminder letter in the mail two months in advance. If you don't receive a letter, please call our office to schedule the follow-up appointment.  Any Other Special Instructions Will Be Listed Below (If Applicable).  MONITOR YOUR BLOOD PRESSURE AT HOME AND IF IT REMAINS ABOVE 130/80 CALL THE OFFICE [phone]  If you need a refill on your cardiac medications before your next appointment, please call your pharmacy.

## 2017-12-23 DIAGNOSIS — J019 Acute sinusitis, unspecified: Secondary | ICD-10-CM | POA: Diagnosis not present

## 2017-12-23 DIAGNOSIS — H109 Unspecified conjunctivitis: Secondary | ICD-10-CM | POA: Diagnosis not present

## 2018-01-07 DIAGNOSIS — M25562 Pain in left knee: Secondary | ICD-10-CM | POA: Diagnosis not present

## 2018-01-07 DIAGNOSIS — M069 Rheumatoid arthritis, unspecified: Secondary | ICD-10-CM | POA: Diagnosis not present

## 2018-03-04 DIAGNOSIS — T1501XA Foreign body in cornea, right eye, initial encounter: Secondary | ICD-10-CM | POA: Diagnosis not present

## 2018-03-06 DIAGNOSIS — M158 Other polyosteoarthritis: Secondary | ICD-10-CM | POA: Diagnosis not present

## 2018-03-06 DIAGNOSIS — M7989 Other specified soft tissue disorders: Secondary | ICD-10-CM | POA: Diagnosis not present

## 2018-03-06 DIAGNOSIS — Z79899 Other long term (current) drug therapy: Secondary | ICD-10-CM | POA: Diagnosis not present

## 2018-03-06 DIAGNOSIS — M0579 Rheumatoid arthritis with rheumatoid factor of multiple sites without organ or systems involvement: Secondary | ICD-10-CM | POA: Diagnosis not present

## 2018-03-06 DIAGNOSIS — M170 Bilateral primary osteoarthritis of knee: Secondary | ICD-10-CM | POA: Diagnosis not present

## 2018-04-17 ENCOUNTER — Other Ambulatory Visit: Payer: Self-pay | Admitting: Obstetrics and Gynecology

## 2018-04-17 DIAGNOSIS — Z1231 Encounter for screening mammogram for malignant neoplasm of breast: Secondary | ICD-10-CM

## 2018-05-05 IMAGING — CT CT ANGIO CHEST-ABD-PELV FOR DISSECTION W/ AND WO/W CM
2 of 7 series · 13 of 46 positions shown, 15 images · IV contrast (isovue)
Comparison: None.

CLINICAL DATA: Chest pain radiating to back.  Shortness of breath.

EXAM:
CT ANGIOGRAPHY CHEST, ABDOMEN AND PELVIS
TECHNIQUE: Multidetector CT imaging through the chest, abdomen and pelvis was
performed using the standard protocol during bolus administration of
intravenous contrast. Multiplanar reconstructed images and MIPs were
obtained and reviewed to evaluate the vascular anatomy.
CONTRAST:  100 mL of Isovue 370

[Series 6: dissection 2mm · axial · 0.69mm/px · z∈[+1010,+1514]mm · 10 of 294 slices shown, 12 images]
[im 28/294  soft-tissue]
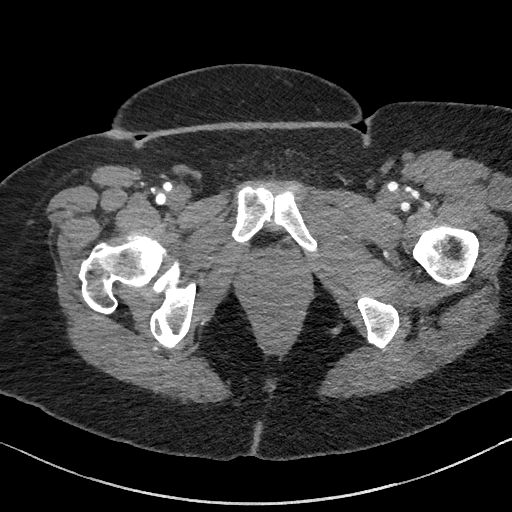
[im 28/294  bone]
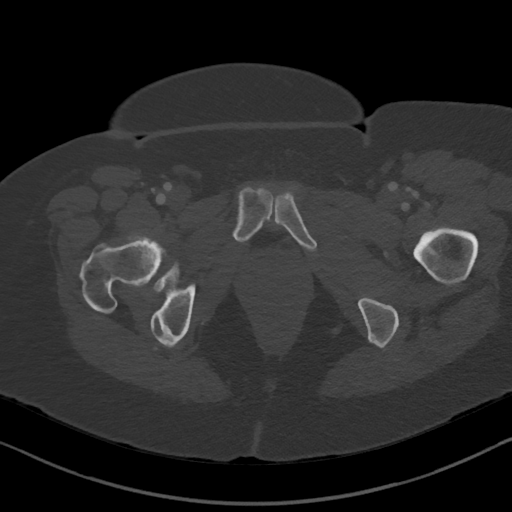
[im 56/294  soft-tissue]
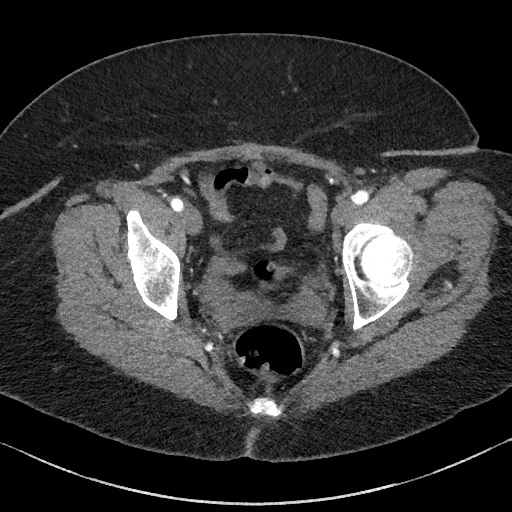
[im 84/294  soft-tissue]
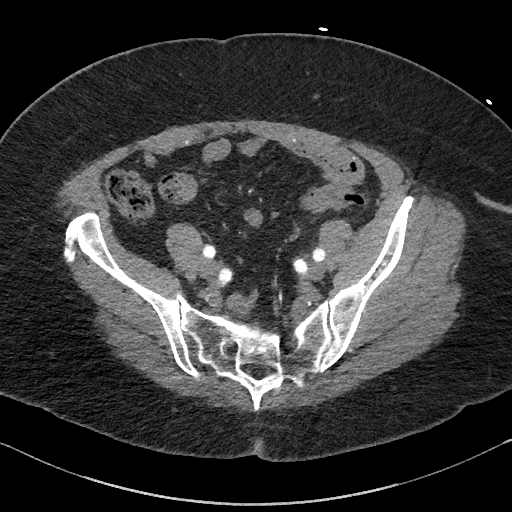
[im 112/294  soft-tissue]
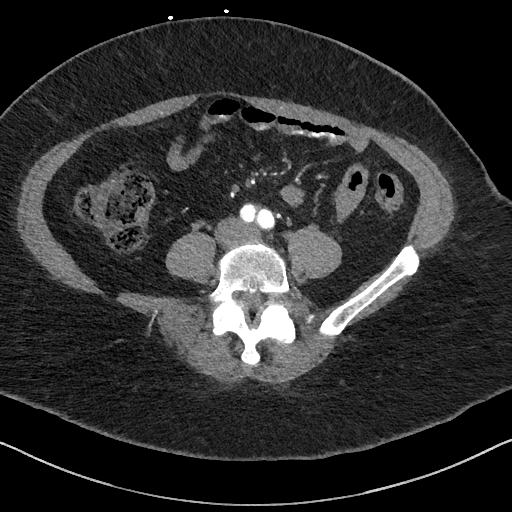
[im 140/294  soft-tissue]
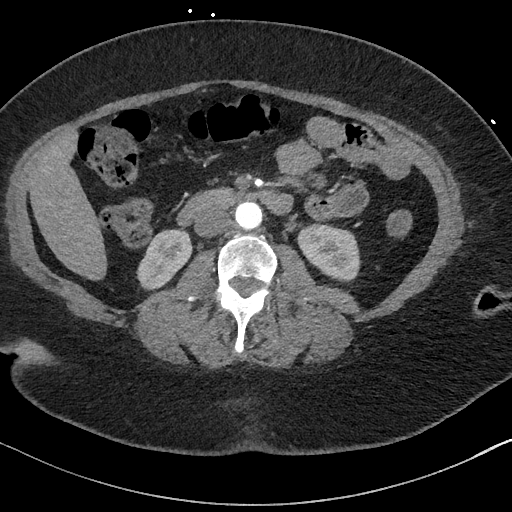
[im 168/294  soft-tissue]
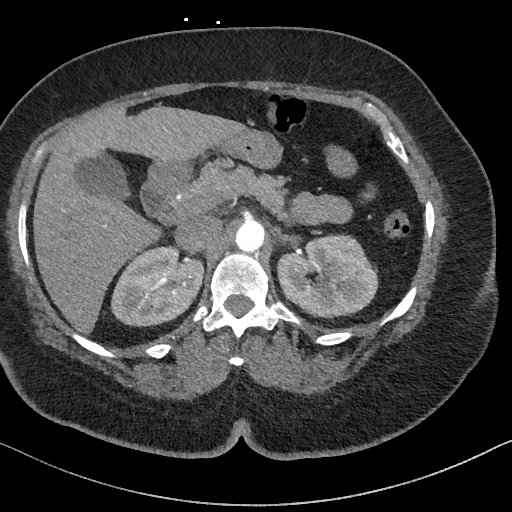
[im 196/294  soft-tissue]
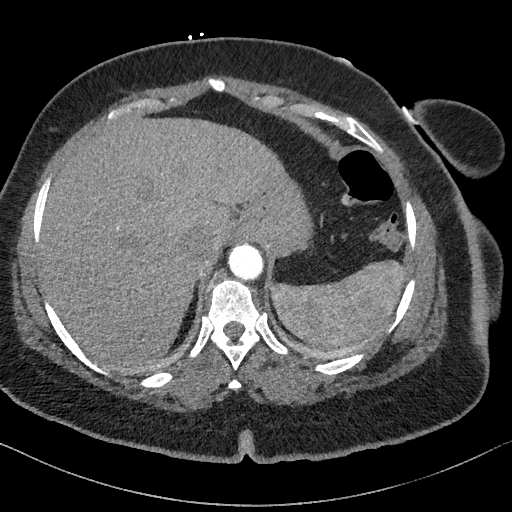
[im 224/294  soft-tissue]
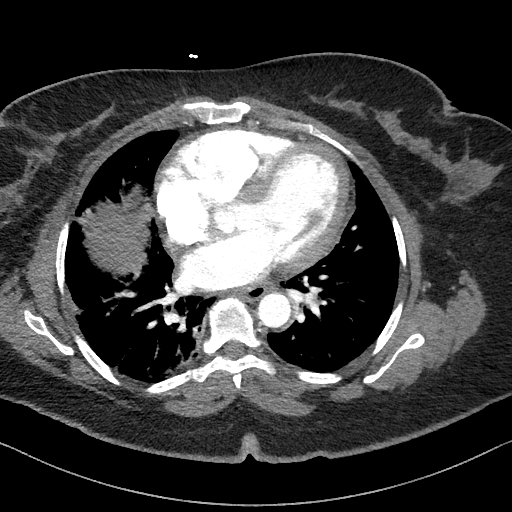
[im 252/294  soft-tissue]
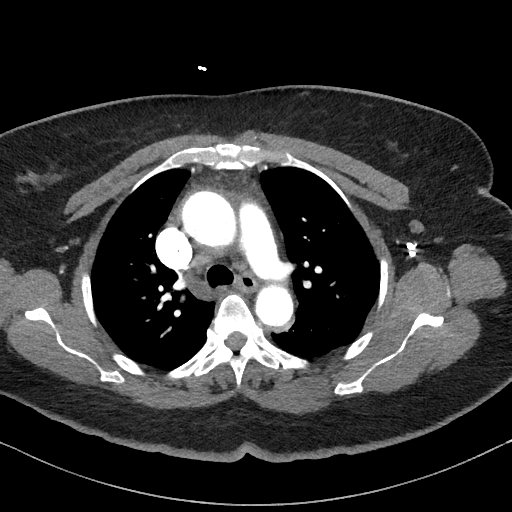
[im 252/294  bone]
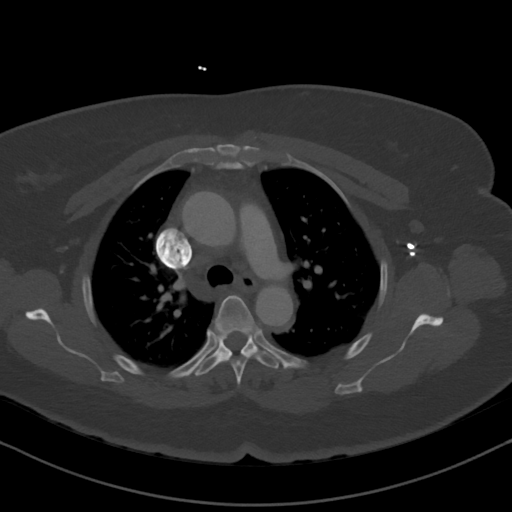
[im 280/294  soft-tissue]
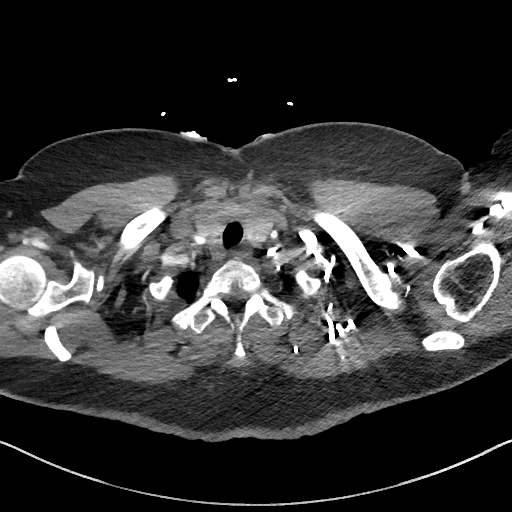

[Series 9: dissection 2mm cor · coronal · 0.70mm/px · 3 of 128 slices shown]
[im 32/128  soft-tissue]
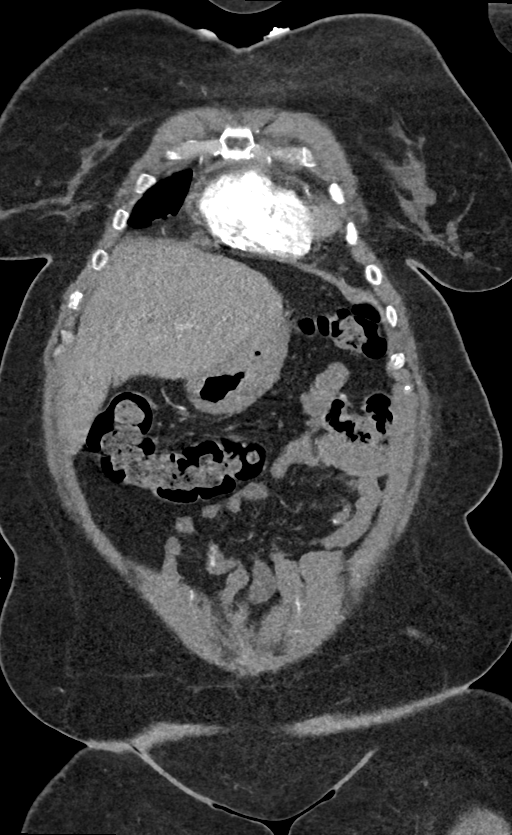
[im 64/128  soft-tissue]
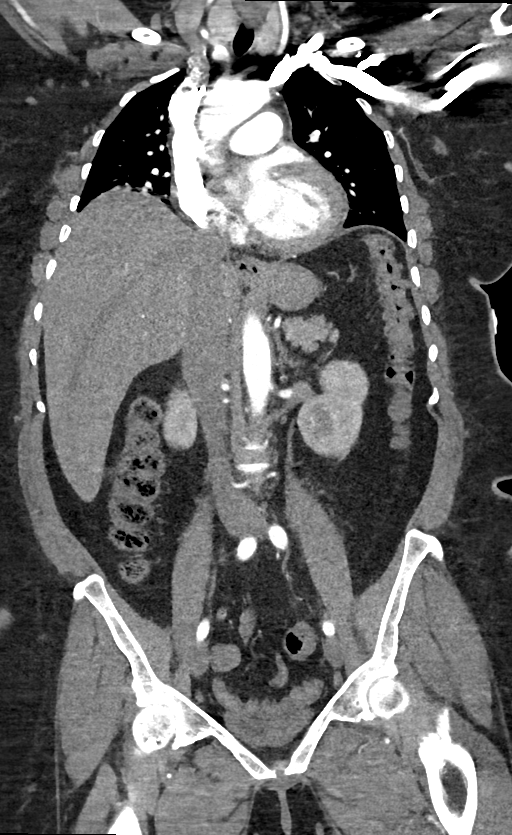
[im 96/128  soft-tissue]
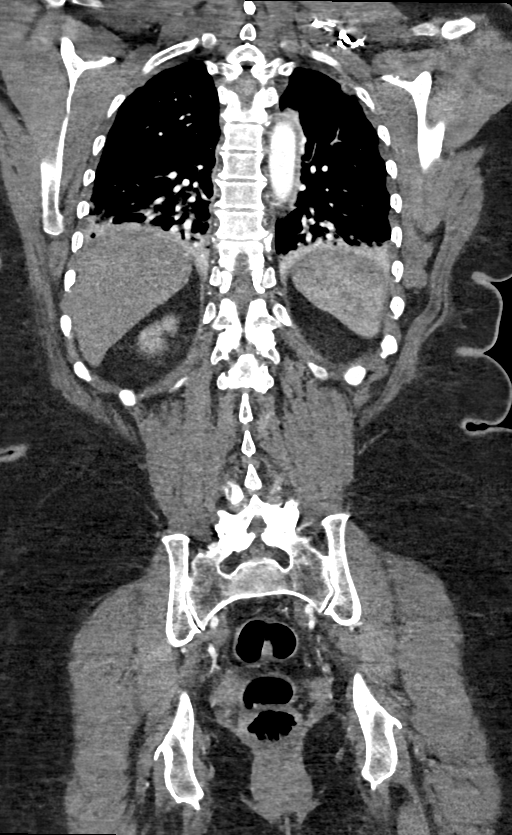

[13 of 46 positions shown; findings below may reference images not displayed]

FINDINGS: CTA CHEST FINDINGS

Evaluation of the thoracic aorta is somewhat limited due to streak
artifact off of venous contrast and cardiac motion. The thoracic
aorta is normal in caliber, measuring 3.5 cm in the ascending
thoracic aorta and less more distally. No significant
atherosclerotic changes seen in the thoracic aorta. No dissection
identified. Branches off the arch of the thoracic aorta are normal
in appearance. The pulmonary arteries are normal in caliber. The
study was not tailored to evaluate for pulmonary emboli but none are
seen. There is a nodule off the posterior right thyroid lobe on
axial image 12 of series 6 measuring 17 mm. No definitive coronary
artery calcifications. The heart size borderline. No effusions. The
central airways are normal. No pneumothorax streaky opacities seen
in the right middle lobe, the lingula, and bases, right greater than
left. Two tiny nodules are seen in the lateral left lung on series
7, image 46 measuring less than 4 mm. A 4 mm nodule is seen in the
right middle lobe on series 7, image 54. No other nodules or masses
identified.

Review of the MIP images confirms the above findings.

CTA ABDOMEN AND PELVIS FINDINGS

The abdominal aorta is normal in caliber with no dissection or
atherosclerotic change. The celiac artery is normal in appearance.
The SMA is normal. There are 2 right renal arteries and a single
left renal artery, normal in appearance. The inferior mesenteric
artery is well opacified as well. The iliac arteries in the proximal
femoral arteries are normal in appearance with no stenosis or
dissection.

No free air or free fluid.

Evaluation of parenchymal organs is limited due to arterial phase
imaging. The liver and gallbladder are normal. The portal vein is
not opacified due to timing of contrast. The spleen, pancreas, and
adrenal glands are normal. The kidneys are unremarkable. No
adenopathy. Evaluation of bowel is limited due to lack of oral
contrast. Within this limitation, the stomach and small bowel are
unremarkable. Colonic diverticuli are seen without diverticulitis.
The appendix is well visualized with no appendicitis. There is a fat
containing umbilical hernia.

No adenopathy or mass in the pelvis. The bladder is decompressed but
unremarkable. The patient is status post hysterectomy.

Mild degenerative changes in the thoracic spine and lower lumbar
facets. No acute bony abnormalities. There is a bone island in the
proximal right femur.

Review of the MIP images confirms the above findings.
IMPRESSION: 1. No aneurysm or dissection in the thoracic or abdominal aorta. The
vessels are unremarkable.
2. Streaky opacity in the lung bases. While there is a component of
atelectasis, superimposed pneumonia is not excluded on this study.
Recommend follow-up to resolution.
3. Pulmonary nodules as noted above. See below for follow-up
recommendations.
4. **An incidental finding of potential clinical significance has
been found. There is a 17 mm right thyroid lobe nodule. Recommend
ultrasound for better evaluation. This can be performed as an
outpatient**
No follow-up needed if patient is low-risk (and has no known or
suspected primary neoplasm). Non-contrast chest CT can be considered
in 12 months if patient is high-risk. This recommendation follows
the consensus statement: Guidelines for Management of Incidental
Pulmonary Nodules Detected on CT Images:From the [HOSPITAL]
2821; published online before print (10.1148/radiol.2599979704).

## 2018-06-16 ENCOUNTER — Ambulatory Visit
Admission: RE | Admit: 2018-06-16 | Discharge: 2018-06-16 | Disposition: A | Discharge status: Home / Self Care | Payer: Federal, State, Local not specified - PPO | Source: Ambulatory Visit | Attending: Obstetrics and Gynecology | Admitting: Obstetrics and Gynecology

## 2018-06-16 DIAGNOSIS — Z1231 Encounter for screening mammogram for malignant neoplasm of breast: Secondary | ICD-10-CM

## 2018-06-19 DIAGNOSIS — Z01419 Encounter for gynecological examination (general) (routine) without abnormal findings: Secondary | ICD-10-CM | POA: Diagnosis not present

## 2018-06-19 DIAGNOSIS — Z13820 Encounter for screening for osteoporosis: Secondary | ICD-10-CM | POA: Diagnosis not present

## 2018-06-19 DIAGNOSIS — Z6837 Body mass index (BMI) 37.0-37.9, adult: Secondary | ICD-10-CM | POA: Diagnosis not present

## 2018-08-21 DIAGNOSIS — H40023 Open angle with borderline findings, high risk, bilateral: Secondary | ICD-10-CM | POA: Diagnosis not present

## 2018-10-06 DIAGNOSIS — Z79899 Other long term (current) drug therapy: Secondary | ICD-10-CM | POA: Diagnosis not present

## 2018-10-06 DIAGNOSIS — E559 Vitamin D deficiency, unspecified: Secondary | ICD-10-CM | POA: Diagnosis not present

## 2018-10-06 DIAGNOSIS — M069 Rheumatoid arthritis, unspecified: Secondary | ICD-10-CM | POA: Diagnosis not present

## 2018-10-07 ENCOUNTER — Encounter (INDEPENDENT_AMBULATORY_CARE_PROVIDER_SITE_OTHER): Payer: Self-pay

## 2018-10-13 ENCOUNTER — Ambulatory Visit (INDEPENDENT_AMBULATORY_CARE_PROVIDER_SITE_OTHER): Payer: Self-pay | Admitting: Bariatrics

## 2018-10-15 ENCOUNTER — Ambulatory Visit (INDEPENDENT_AMBULATORY_CARE_PROVIDER_SITE_OTHER): Payer: Federal, State, Local not specified - PPO | Admitting: Bariatrics

## 2018-10-15 ENCOUNTER — Encounter (INDEPENDENT_AMBULATORY_CARE_PROVIDER_SITE_OTHER): Payer: Self-pay | Admitting: Bariatrics

## 2018-10-15 VITALS — BP 173/90 | HR 75 | Temp 97.7°F | Ht 65.0 in | Wt 230.0 lb

## 2018-10-15 DIAGNOSIS — Z1331 Encounter for screening for depression: Secondary | ICD-10-CM | POA: Diagnosis not present

## 2018-10-15 DIAGNOSIS — Z0289 Encounter for other administrative examinations: Secondary | ICD-10-CM

## 2018-10-15 DIAGNOSIS — Z9189 Other specified personal risk factors, not elsewhere classified: Secondary | ICD-10-CM | POA: Diagnosis not present

## 2018-10-15 DIAGNOSIS — I10 Essential (primary) hypertension: Secondary | ICD-10-CM | POA: Diagnosis not present

## 2018-10-15 DIAGNOSIS — E559 Vitamin D deficiency, unspecified: Secondary | ICD-10-CM

## 2018-10-15 DIAGNOSIS — E538 Deficiency of other specified B group vitamins: Secondary | ICD-10-CM

## 2018-10-15 DIAGNOSIS — R7303 Prediabetes: Secondary | ICD-10-CM

## 2018-10-15 DIAGNOSIS — Z6838 Body mass index (BMI) 38.0-38.9, adult: Secondary | ICD-10-CM

## 2018-10-15 DIAGNOSIS — E7800 Pure hypercholesterolemia, unspecified: Secondary | ICD-10-CM

## 2018-10-15 DIAGNOSIS — R0602 Shortness of breath: Secondary | ICD-10-CM | POA: Diagnosis not present

## 2018-10-15 DIAGNOSIS — R5383 Other fatigue: Secondary | ICD-10-CM | POA: Diagnosis not present

## 2018-10-15 NOTE — Progress Notes (Signed)
.  Office: [phone]  /  Fax: [phone]   HPI:   Chief Complaint: OBESITY  Rebekah [name] (MR# [id number]) is a 63 y.o. female who presents on 10/15/2018 for obesity evaluation and treatment. Current BMI is Body mass index is 38.27 kg/m.Marland Kitchen Rebekah [name] has struggled with obesity for years and has been unsuccessful in either losing weight or maintaining long term weight loss. Rebekah [name] attended our information session and states she is currently in the action stage of change and ready to dedicate time achieving and maintaining a healthier weight.  Rebekah [name] states her family eats meals together she thinks her family will eat healthier with her her desired weight loss is 50 lbs. she started gaining weight when she moved to [place] her heaviest weight ever was 230 lbs. she likes to cook sometimes she denies any food dislikes she is a "picky eater" sometimes  she has significant food cravings issues  she frequently makes poor food choices she frequently eats larger portions than normal    Fatigue Rebekah [name] feels her energy is lower than it should be. This has worsened with weight gain and has not worsened recently. Rebekah [name] admits to daytime somnolence and she admits to waking up still tired. Patient is at risk for obstructive sleep apnea. Patent has a history of symptoms of daytime fatigue, morning fatigue and hypertension. Patient generally gets 6 hours of sleep per night, and she gets good sleep. Snoring is present. Apneic episodes are present. Epworth Sleepiness Score is 3  Dyspnea on exertion Rebekah [name] notes increasing shortness of breath with certain activities (walking fast) and seems to be worsening over time with weight gain. She notes getting out of breath sooner with activity than she used to. This has not gotten worse recently. Rebekah [name] denies orthopnea.  Hypertension Rebekah [name] is a 63 y.o. female with hypertension. She is taking Amlodipine and HCTZ. Her blood pressure is 173/90 and she  did not take her medications today. Rebekah [name] denies chest pain. She is working weight loss to help control her blood pressure with the goal of decreasing her risk of heart attack and stroke. [name] blood pressure is not currently controlled.  Pre-Diabetes Rebekah [name] has a diagnosis of prediabetes based on her elevated Hgb A1c and was informed this puts her at greater risk of developing diabetes. She is not taking medications currently and continues to work on diet and exercise to decrease risk of diabetes. She denies polyphagia.  Pure Hypercholesterolemia Rebekah [name] has pure hypercholesterolemia and she is taking Simvastatin. Rebekah [name] is attempting to improve her cholesterol levels with intensive lifestyle modification including a low saturated fat diet, exercise and weight loss. She denies any myalgias.  Vitamin D deficiency Rebekah [name] has a diagnosis of vitamin D deficiency. Her last vitamin D level was at 20 She is currently taking high dose vit D and denies nausea, vomiting or muscle weakness.  B12 Deficiency Rebekah [name] has a diagnosis of B12 insufficiency and notes fatigue. This is a new diagnosis. Rebekah [name] is not a vegetarian and does not have a previous diagnosis of pernicious anemia. She does not have a history of weight loss surgery.   At risk for cardiovascular disease Rebekah [name] is at a higher than average risk for cardiovascular disease due to obesity, hypertension and pure hypercholesterolemia. She currently denies any chest pain.  Depression Screen Rebekah [name]'s Food and Mood (modified PHQ-9) score was  Depression screen PHQ 2/9 10/15/2018  Decreased Interest 0  Down, Depressed, Hopeless 0  PHQ - 2 Score 0  Altered sleeping 0  Tired, decreased energy 0  Change in appetite 0  Feeling bad or failure about yourself  0  Trouble concentrating 0  Moving slowly or fidgety/restless 0  Suicidal thoughts 0  PHQ-9 Score 0  Difficult doing work/chores Not difficult at all    ASSESSMENT AND PLAN:  Other  fatigue - Plan: EKG 12-Lead, T3, T4, free, TSH  Shortness of breath on exertion  Essential hypertension  Prediabetes - Plan: Hemoglobin A1c, Insulin, random  Pure hypercholesterolemia - Plan: Lipid Panel With LDL/HDL Ratio  Vitamin D deficiency  B12 nutritional deficiency  Depression screening  At risk for heart disease  Class 2 severe obesity with serious comorbidity and body mass index (BMI) of 38.0 to 38.9 in adult, unspecified obesity type (HCC)  PLAN:  Fatigue Rebekah [name] was informed that her fatigue may be related to obesity, depression or many other causes. Labs will be ordered, and in the meanwhile Rebekah [name] has agreed to work on diet, exercise and weight loss to help with fatigue. Proper sleep hygiene was discussed including the need for 7-8 hours of quality sleep each night. A sleep study was not ordered based on symptoms and Epworth score.  Dyspnea on exertion Rebekah [name]'s shortness of breath appears to be obesity related and exercise induced. She has agreed to work on weight loss and gradually increase exercise to treat her exercise induced shortness of breath. If Rebekah [name] follows our instructions and loses weight without improvement of her shortness of breath, we will plan to refer to pulmonology. We will monitor this condition regularly. Rebekah [name] agrees to this plan.  Hypertension We discussed sodium restriction, working on healthy weight loss, and a regular exercise program as the means to achieve improved blood pressure control. Rebekah [name] agreed with this plan and agreed to follow up as directed. We will continue to monitor her blood pressure as well as her progress with the above lifestyle modifications. She will continue her medications as prescribed and will watch for signs of hypotension as she continues her lifestyle modifications.  Pre-Diabetes Rebekah [name] will continue to work on weight loss, exercise, and decreasing simple carbohydrates in her diet to help decrease the risk of diabetes.  She was informed that eating too many simple carbohydrates or too many calories at one sitting increases the likelihood of GI side effects. We will check Hgb A1c and insulin level today. Deaira agreed to follow up with [name] as directed to monitor her progress.  Pure Hypercholesterolemia Rebekah [name] was informed of the American Heart Association Guidelines emphasizing intensive lifestyle modifications as the first line treatment for hypercholesterolemia. We discussed many lifestyle modifications today in depth, and Ahley will start to work on decreasing saturated fats such as fatty red meat, butter and many fried foods. She will also increase vegetables and lean protein in her diet and start to work on exercise and weight loss efforts. We will check fasting lipid panel (cholesterol) and Samone will continue her medications as prescribed.  Vitamin D Deficiency Rebekah [name] was informed that low vitamin D levels contributes to fatigue and are associated with obesity, breast, and colon cancer. She agrees to continue to take prescription Vit D and will follow up for routine testing of vitamin D, at least 2-3 times per year. She was informed of the risk of over-replacement of vitamin D and agrees to not increase her dose unless she discusses this with [name] first.  B12 Deficiency Rebekah [name] will work on increasing B12 rich foods in her diet. B12 supplementation was not prescribed today.  We will check B12 level and folate today and Rebekah [name] will follow up as directed.  Cardiovascular risk counseling Rebekah [name] was given extended (15 minutes) coronary artery disease prevention counseling today. She is 63 y.o. female and has risk factors for heart disease including obesity, hypertension, and pure hypercholesterolemia. We discussed intensive lifestyle modifications today with an emphasis on specific weight loss instructions and strategies. Pt was also informed of the importance of increasing exercise and decreasing saturated fats to help prevent  heart disease.  Depression Screen Ayeisha had a negative depression screening. Depression is commonly associated with obesity and often results in emotional eating behaviors. We will monitor this closely and work on CBT to help improve the non-hunger eating patterns.  Obesity Rebekah [name] is currently in the action stage of change and her goal is to continue with weight loss efforts She has agreed to follow the Category 1 plan Rebekah [name] has been instructed to work up to a goal of 150 minutes of combined cardio and strengthening exercise per week for weight loss and overall health benefits. We discussed the following Behavioral Modification Strategies today: increase H2O intake, no skipping meals, keeping healthy foods in the home, increasing lean protein intake, decreasing simple carbohydrates, increasing vegetables, healthy snacks, decrease eating out and work on meal planning and more consistent eating  Rebekah [name] has agreed to follow up with our clinic in 2 weeks. She was informed of the importance of frequent follow up visits to maximize her success with intensive lifestyle modifications for her multiple health conditions. She was informed we would discuss her lab results at her next visit unless there is a critical issue that needs to be addressed sooner. Jaquelin agreed to keep her next visit at the agreed upon time to discuss these results.  ALLERGIES: Allergies  Allergen Reactions  . Crestor [Rosuvastatin] Other (See Comments)    myalgias    MEDICATIONS: Current Outpatient Medications on File Prior to Visit  Medication Sig Dispense Refill  . aspirin 81 MG chewable tablet Chew 81 mg by mouth daily.    . Ergocalciferol (VITAMIN D2 PO) Take by mouth. 1.25 mg    . estradiol (ESTRACE) 1 MG tablet Take 1 mg by mouth daily.    . furosemide (LASIX) 20 MG tablet Take 20 mg by mouth.    . hydrochlorothiazide (HYDRODIURIL) 25 MG tablet Take 25 mg by mouth daily.    Marland Kitchen leflunomide (ARAVA) 20 MG tablet Take 20 mg  by mouth daily.    Marland Kitchen lubiprostone (AMITIZA) 24 MCG capsule Take 24 mcg by mouth 2 (two) times daily as needed.    . simvastatin (ZOCOR) 20 MG tablet Take 1 tablet by mouth every evening.  12  . amLODipine (NORVASC) 5 MG tablet Take 1 tablet (5 mg total) by mouth daily. 90 tablet 3  . HYDROcodone-acetaminophen (NORCO/VICODIN) 5-325 MG tablet Take 1-2 tablets by mouth every 6 (six) hours as needed for severe pain. (Patient not taking: Reported on 10/15/2018) 10 tablet 0  . ibuprofen (ADVIL,MOTRIN) 800 MG tablet Take 1 tablet (800 mg total) by mouth 3 (three) times daily. (Patient not taking: Reported on 10/15/2018) 21 tablet 0  . naproxen (NAPROSYN) 500 MG tablet Take 1 tablet (500 mg total) by mouth 2 (two) times daily. (Patient not taking: Reported on 10/15/2018) 20 tablet 0   No current facility-administered medications on file prior to visit.     PAST MEDICAL HISTORY: Past Medical History:  Diagnosis Date  . Arthritis   . Diabetes mellitus type 2 in  obese ([place]) 10/10/2017  . Essential hypertension 10/10/2017  . Hypertension   . Morbid obesity ([place]) 10/10/2017  . Pure hypercholesterolemia 10/10/2017  . Rheumatoid arteritis ([place])   . Vitamin D deficiency     PAST SURGICAL HISTORY: Past Surgical History:  Procedure Laterality Date  . ABDOMINAL HYSTERECTOMY    . biopsy on tongue  1976  . BREAST EXCISIONAL BIOPSY Left   . CESAREAN SECTION    . TUBAL LIGATION      SOCIAL HISTORY: Social History   Tobacco Use  . Smoking status: Former Smoker    Last attempt to quit: 09/11/2010    Years since quitting: 8.0  . Smokeless tobacco: Never Used  Substance Use Topics  . Alcohol use: Yes    Comment: monthly   . Drug use: Not on file    FAMILY HISTORY: Family History  Problem Relation Age of Onset  . CAD Mother   . Cerebral aneurysm Father   . Stroke Sister   . CVA Sister   . Heart attack Maternal Grandmother   . Stroke Maternal Grandmother   . Alzheimer's disease Maternal Grandfather    . Cancer Paternal Grandmother     ROS: Review of Systems  Constitutional: Positive for malaise/fatigue.  Respiratory: Positive for shortness of breath (on exertion).   Cardiovascular: Negative for chest pain.  Gastrointestinal: Negative for nausea and vomiting.  Musculoskeletal: Negative for myalgias.       Negative for muscle weakness  Endo/Heme/Allergies:       Negative for polyphagia    PHYSICAL EXAM: Blood pressure (!) 173/90, pulse 75, temperature 97.7 F (36.5 C), temperature source Oral, height 5\' 5"  (1.651 m), weight 230 lb (104.3 kg), last menstrual period 08/13/1998, SpO2 99 %. Body mass index is 38.27 kg/m. Physical Exam Vitals signs reviewed.  Constitutional:      Appearance: Normal appearance. She is well-developed. She is obese.  HENT:     Head: Normocephalic and atraumatic.     Nose: Nose normal.     Mouth/Throat:     Comments: Mallampati = 3 Eyes:     General: No scleral icterus.    Extraocular Movements: Extraocular movements intact.  Neck:     Musculoskeletal: Normal range of motion and neck supple.     Thyroid: No thyromegaly.  Cardiovascular:     Rate and Rhythm: Normal rate and regular rhythm.  Pulmonary:     Effort: Pulmonary effort is normal. No respiratory distress.  Abdominal:     Palpations: Abdomen is soft.     Tenderness: There is no abdominal tenderness.  Musculoskeletal: Normal range of motion.     Comments: Range of Motion normal in all 4 extremities  Skin:    General: Skin is warm and dry.  Neurological:     Mental Status: She is alert and oriented to person, place, and time.     Coordination: Coordination normal.  Psychiatric:        Mood and Affect: Mood normal.        Behavior: Behavior normal.     RECENT LABS AND TESTS: BMET    Component Value Date/Time   NA 138 05/28/2017 1117   K 3.7 05/28/2017 1117   CL 103 05/28/2017 1117   CO2 27 05/28/2017 1117   GLUCOSE 147 (H) 05/28/2017 1117   BUN 19 05/28/2017 1117    CREATININE 0.84 05/28/2017 1117   CALCIUM 9.2 05/28/2017 1117   GFRNONAA >60 05/28/2017 1117   GFRAA >60 05/28/2017 1117   No  results found for: HGBA1C No results found for: INSULIN CBC    Component Value Date/Time   WBC 9.7 05/28/2017 1117   RBC 4.12 05/28/2017 1117   HGB 12.0 05/28/2017 1117   HCT 36.8 05/28/2017 1117   PLT 219 05/28/2017 1117   MCV 89.3 05/28/2017 1117   MCH 29.1 05/28/2017 1117   MCHC 32.6 05/28/2017 1117   RDW 13.7 05/28/2017 1117   LYMPHSABS 1.6 11/04/2016 0247   MONOABS 0.7 11/04/2016 0247   EOSABS 0.0 11/04/2016 0247   BASOSABS 0.0 11/04/2016 0247   Iron/TIBC/Ferritin/ %Sat No results found for: IRON, TIBC, FERRITIN, IRONPCTSAT Lipid Panel  No results found for: CHOL, TRIG, HDL, CHOLHDL, VLDL, LDLCALC, LDLDIRECT Hepatic Function Panel  No results found for: PROT, ALBUMIN, AST, ALT, ALKPHOS, BILITOT, BILIDIR, IBILI No results found for: TSH Vitamin D There are no recent lab results  ECG  shows NSR with a rate of 76 BPM INDIRECT CALORIMETER done today shows a VO2 of 199 and a REE of 1387. Her calculated basal metabolic rate is 2440 thus her basal metabolic rate is worse than expected.   OBESITY BEHAVIORAL INTERVENTION VISIT  Today's visit was # 1   Starting weight: 230 lbs Starting date: 10/15/2018 Today's weight : 230 lbs  Today's date: 10/15/2018 Total lbs lost to date: 0    10/15/2018  Height 5\' 5"  (1.651 m)  Weight 230 lb (104.3 kg)  BMI (Calculated) 38.27  BLOOD PRESSURE - SYSTOLIC 102  BLOOD PRESSURE - DIASTOLIC 90  Waist Measurement  46 inches   Body Fat % 43.2 %  Total Body Water (lbs) 90 lbs  RMR 1387    ASK: We discussed the diagnosis of obesity with Rebekah [name] today and Rebekah [name] agreed to give [name] permission to discuss obesity behavioral modification therapy today.  ASSESS: Rebekah [name] has the diagnosis of obesity and her BMI today is 38.27 Rebekah [name] is in the action stage of change   ADVISE: Rhaelyn was educated on the multiple  health risks of obesity as well as the benefit of weight loss to improve her health. She was advised of the need for long term treatment and the importance of lifestyle modifications to improve her current health and to decrease her risk of future health problems.  AGREE: Multiple dietary modification options and treatment options were discussed and  Francia agreed to follow the recommendations documented in the above note.  ARRANGE: Olene was educated on the importance of frequent visits to treat obesity as outlined per CMS and USPSTF guidelines and agreed to schedule her next follow up appointment today.   Corey Skains, am acting as Location manager for [employer]. Owens Shark, DO  I have reviewed the above documentation for accuracy and completeness, and I agree with the above. -Jearld Lesch, DO

## 2018-10-16 DIAGNOSIS — R7303 Prediabetes: Secondary | ICD-10-CM

## 2018-10-17 LAB — T4, FREE: Free T4: 1.36 ng/dL (ref 0.82–1.77)

## 2018-10-17 LAB — INSULIN, RANDOM: INSULIN: 10.6 u[IU]/mL (ref 2.6–24.9)

## 2018-10-17 LAB — LIPID PANEL WITH LDL/HDL RATIO
Cholesterol, Total: 187 mg/dL (ref 100–199)
HDL: 67 mg/dL (ref >39)
LDL Calculated: 105 mg/dL — ABNORMAL HIGH (ref 0–99)
LDl/HDL Ratio: 1.6 ratio (ref 0.0–3.2)
Triglycerides: 75 mg/dL (ref 0–149)
VLDL Cholesterol Cal: 15 mg/dL (ref 5–40)

## 2018-10-17 LAB — HEMOGLOBIN A1C
Est. average glucose Bld gHb Est-mCnc: 154 mg/dL
Hgb A1c MFr Bld: 7 % — ABNORMAL HIGH (ref 4.8–5.6)

## 2018-10-17 LAB — TSH: TSH: 1.06 u[IU]/mL (ref 0.450–4.500)

## 2018-10-17 LAB — T3: T3, Total: 122 ng/dL (ref 71–180)

## 2018-10-17 LAB — FOLATE: Folate: 7 ng/mL (ref >3.0)

## 2018-10-17 LAB — VITAMIN B12: Vitamin B-12: 450 pg/mL (ref 232–1245)

## 2018-10-27 ENCOUNTER — Ambulatory Visit (INDEPENDENT_AMBULATORY_CARE_PROVIDER_SITE_OTHER): Payer: Self-pay | Admitting: Bariatrics

## 2018-10-29 ENCOUNTER — Encounter (INDEPENDENT_AMBULATORY_CARE_PROVIDER_SITE_OTHER): Payer: Self-pay

## 2018-10-29 ENCOUNTER — Ambulatory Visit (INDEPENDENT_AMBULATORY_CARE_PROVIDER_SITE_OTHER): Payer: Self-pay | Admitting: Bariatrics

## 2018-11-03 ENCOUNTER — Other Ambulatory Visit: Payer: Self-pay

## 2018-11-03 ENCOUNTER — Encounter (INDEPENDENT_AMBULATORY_CARE_PROVIDER_SITE_OTHER): Payer: Self-pay | Admitting: Bariatrics

## 2018-11-03 ENCOUNTER — Ambulatory Visit (INDEPENDENT_AMBULATORY_CARE_PROVIDER_SITE_OTHER): Payer: Federal, State, Local not specified - PPO | Admitting: Bariatrics

## 2018-11-03 VITALS — BP 117/77 | HR 89 | Temp 98.3°F | Ht 65.0 in | Wt 216.0 lb

## 2018-11-03 DIAGNOSIS — E559 Vitamin D deficiency, unspecified: Secondary | ICD-10-CM | POA: Diagnosis not present

## 2018-11-03 DIAGNOSIS — Z9189 Other specified personal risk factors, not elsewhere classified: Secondary | ICD-10-CM

## 2018-11-03 DIAGNOSIS — E119 Type 2 diabetes mellitus without complications: Secondary | ICD-10-CM

## 2018-11-03 DIAGNOSIS — Z711 Person with feared health complaint in whom no diagnosis is made: Secondary | ICD-10-CM

## 2018-11-03 DIAGNOSIS — E66812 Obesity, class 2: Secondary | ICD-10-CM

## 2018-11-03 DIAGNOSIS — Z6836 Body mass index (BMI) 36.0-36.9, adult: Secondary | ICD-10-CM

## 2018-11-03 MED ORDER — VITAMIN D (ERGOCALCIFEROL) 1.25 MG (50000 UNIT) PO CAPS: 50000.0000 [IU] | ORAL_CAPSULE | ORAL | 0 refills | Status: DC

## 2018-11-03 MED ORDER — METFORMIN HCL 500 MG PO TABS: 500.0000 mg | ORAL_TABLET | Freq: Every day | ORAL | 0 refills | Status: DC

## 2018-11-03 NOTE — Progress Notes (Signed)
Office: [phone]  /  Fax: [phone]   HPI:   Chief Complaint: OBESITY Rebekah [name] is here to discuss her progress with her obesity treatment plan. She is on the Category 1 plan and is following her eating plan approximately 100 % of the time. She states she is doing Zumba, walking on the treadmill and biking for 60 minutes 3 times per week. Rebekah [name] is doing very well. She liked everything about the plan. She likes the scale, eating three meals a day and the flexibility. Rebekah [name] has no challenges. Her weight is 216 lb (98 kg) today and has had a weight loss of 14 pounds over a period of 2 to 3 weeks since her last visit. She has lost 14 lbs since starting treatment with [name].  Diabetes II Rebekah [name] has a diagnosis of diabetes type II. Rebekah [name] has a meter at home. Her last A1c was at 7.0 and last insulin level was at 10.6 She has been working on intensive lifestyle modifications including diet, exercise, and weight loss to help control her blood glucose levels.  Vitamin D deficiency Maryalice has a diagnosis of vitamin D deficiency. Her last vitamin D level was at 20. She is currently taking high dose vit D and denies nausea, vomiting or muscle weakness.  At risk for osteopenia and osteoporosis Rebekah [name] is at higher risk of osteopenia and osteoporosis due to vitamin D deficiency.   Worried Well; She had questions regarding Covid 19 about how long it would last and about how to find the foods recommended for health and weight loss.    ASSESSMENT AND PLAN:  Type 2 diabetes mellitus without complication, without long-term current use of insulin (HCC) - Plan: metFORMIN (GLUCOPHAGE) 500 MG tablet  Vitamin D deficiency - Plan: Vitamin D, Ergocalciferol, (DRISDOL) 1.25 MG (50000 UT) CAPS capsule  Worried well  At risk for osteoporosis  Class 2 severe obesity with serious comorbidity and body mass index (BMI) of 36.0 to 36.9 in adult, unspecified obesity type ([place])  PLAN:  Diabetes II Rebekah [name] has been  given extensive diabetes education by myself today including ideal fasting and post-prandial blood glucose readings, individual ideal Hgb A1c goals and hypoglycemia prevention. We discussed the importance of good blood sugar control to decrease the likelihood of diabetic complications such as nephropathy, neuropathy, limb loss, blindness, coronary artery disease, and death. We discussed the importance of intensive lifestyle modification including diet, exercise and weight loss as the first line treatment for diabetes. Rebekah [name] agrees to take Metformin once daily #30 with no refills and log some blood sugar readings. Rebekah [name] agrees to follow up with our clinic in 2 weeks.   Vitamin D Deficiency Rebekah [name] was informed that low vitamin D levels contributes to fatigue and are associated with obesity, breast, and colon cancer. She agrees to continue to take prescription Vit D @50 ,000 IU every week #4 with no refills and will follow up for routine testing of vitamin D, at least 2-3 times per year. She was informed of the risk of over-replacement of vitamin D and agrees to not increase her dose unless she discusses this with [name] first. Rebekah [name] agrees to follow up as directed.  At risk for osteopenia and osteoporosis Rebekah [name] was given extended  (15 minutes) osteoporosis prevention counseling today. Rebekah [name] is at risk for osteopenia and osteoporosis due to her vitamin D deficiency. She was encouraged to take her vitamin D and follow her higher calcium diet and increase strengthening exercise to help strengthen her bones and decrease her risk  of osteopenia and osteoporosis.  Worried Well Pt was educated on the need for social distancing, no travel and hand washing/hand sanitizing. We discussed symptoms of COVID19 and we discussed the possible need for sequestration and how to deal with this if it happens. Pt offered guidance and reassurance for substituting certain foods.   Obesity Rebekah [name] is currently in the action stage of  change. As such, her goal is to continue with weight loss efforts She has agreed to follow the Category 1 plan Rebekah [name] will start walking the trail for weight loss and overall health benefits. We discussed the following Behavioral Modification Strategies today: increase H2O intake, no skipping meals, keeping healthy foods in the home, increasing lean protein intake, decreasing simple carbohydrates, increasing vegetables, decrease eating out and work on meal planning and intentional eating Suggestions for vegetables were given to patient today.  Rebekah [name] has agreed to follow up with our clinic in 2 weeks. She was informed of the importance of frequent follow up visits to maximize her success with intensive lifestyle modifications for her multiple health conditions.  ALLERGIES: Allergies  Allergen Reactions  . Crestor [Rosuvastatin] Other (See Comments)    myalgias    MEDICATIONS: Current Outpatient Medications on File Prior to Visit  Medication Sig Dispense Refill  . aspirin 81 MG chewable tablet Chew 81 mg by mouth daily.    Marland Kitchen estradiol (ESTRACE) 1 MG tablet Take 1 mg by mouth daily.    . furosemide (LASIX) 20 MG tablet Take 20 mg by mouth.    . hydrochlorothiazide (HYDRODIURIL) 25 MG tablet Take 25 mg by mouth daily.    Marland Kitchen HYDROcodone-acetaminophen (NORCO/VICODIN) 5-325 MG tablet Take 1-2 tablets by mouth every 6 (six) hours as needed for severe pain. 10 tablet 0  . ibuprofen (ADVIL,MOTRIN) 800 MG tablet Take 1 tablet (800 mg total) by mouth 3 (three) times daily. 21 tablet 0  . leflunomide (ARAVA) 20 MG tablet Take 20 mg by mouth daily.    Marland Kitchen lubiprostone (AMITIZA) 24 MCG capsule Take 24 mcg by mouth 2 (two) times daily as needed.    . naproxen (NAPROSYN) 500 MG tablet Take 1 tablet (500 mg total) by mouth 2 (two) times daily. 20 tablet 0  . simvastatin (ZOCOR) 20 MG tablet Take 1 tablet by mouth every evening.  12  . amLODipine (NORVASC) 5 MG tablet Take 1 tablet (5 mg total) by mouth daily.  90 tablet 3   No current facility-administered medications on file prior to visit.     PAST MEDICAL HISTORY: Past Medical History:  Diagnosis Date  . Arthritis   . Diabetes mellitus type 2 in obese ([place]) 10/10/2017  . Essential hypertension 10/10/2017  . Hypertension   . Morbid obesity ([place]) 10/10/2017  . Pure hypercholesterolemia 10/10/2017  . Rheumatoid arteritis ([place])   . Vitamin D deficiency     PAST SURGICAL HISTORY: Past Surgical History:  Procedure Laterality Date  . ABDOMINAL HYSTERECTOMY    . biopsy on tongue  1976  . BREAST EXCISIONAL BIOPSY Left   . CESAREAN SECTION    . TUBAL LIGATION      SOCIAL HISTORY: Social History   Tobacco Use  . Smoking status: Former Smoker    Last attempt to quit: 09/11/2010    Years since quitting: 8.1  . Smokeless tobacco: Never Used  Substance Use Topics  . Alcohol use: Yes    Comment: monthly   . Drug use: Not on file    FAMILY HISTORY: Family History  Problem  Relation Age of Onset  . CAD Mother   . Cerebral aneurysm Father   . Stroke Sister   . CVA Sister   . Heart attack Maternal Grandmother   . Stroke Maternal Grandmother   . Alzheimer's disease Maternal Grandfather   . Cancer Paternal Grandmother     ROS: Review of Systems  Constitutional: Positive for weight loss.  Gastrointestinal: Negative for nausea and vomiting.  Musculoskeletal:       Negative for muscle weakness    PHYSICAL EXAM: Blood pressure 117/77, pulse 89, temperature 98.3 F (36.8 C), temperature source Oral, height 5\' 5"  (1.651 m), weight 216 lb (98 kg), last menstrual period 08/13/1998, SpO2 99 %. Body mass index is 35.94 kg/m. Physical Exam Vitals signs reviewed.  Constitutional:      Appearance: Normal appearance. She is well-developed. She is obese.  Cardiovascular:     Rate and Rhythm: Normal rate.  Pulmonary:     Effort: Pulmonary effort is normal.  Musculoskeletal: Normal range of motion.  Skin:    General: Skin is warm and  dry.  Neurological:     Mental Status: She is alert and oriented to person, place, and time.  Psychiatric:        Mood and Affect: Mood normal.        Behavior: Behavior normal.     RECENT LABS AND TESTS: BMET    Component Value Date/Time   NA 138 05/28/2017 1117   K 3.7 05/28/2017 1117   CL 103 05/28/2017 1117   CO2 27 05/28/2017 1117   GLUCOSE 147 (H) 05/28/2017 1117   BUN 19 05/28/2017 1117   CREATININE 0.84 05/28/2017 1117   CALCIUM 9.2 05/28/2017 1117   GFRNONAA >60 05/28/2017 1117   GFRAA >60 05/28/2017 1117   Lab Results  Component Value Date   HGBA1C 7.0 (H) 10/15/2018   Lab Results  Component Value Date   INSULIN 10.6 10/15/2018   CBC    Component Value Date/Time   WBC 9.7 05/28/2017 1117   RBC 4.12 05/28/2017 1117   HGB 12.0 05/28/2017 1117   HCT 36.8 05/28/2017 1117   PLT 219 05/28/2017 1117   MCV 89.3 05/28/2017 1117   MCH 29.1 05/28/2017 1117   MCHC 32.6 05/28/2017 1117   RDW 13.7 05/28/2017 1117   LYMPHSABS 1.6 11/04/2016 0247   MONOABS 0.7 11/04/2016 0247   EOSABS 0.0 11/04/2016 0247   BASOSABS 0.0 11/04/2016 0247   Iron/TIBC/Ferritin/ %Sat No results found for: IRON, TIBC, FERRITIN, IRONPCTSAT Lipid Panel     Component Value Date/Time   CHOL 187 10/15/2018 1017   TRIG 75 10/15/2018 1017   HDL 67 10/15/2018 1017   LDLCALC 105 (H) 10/15/2018 1017   Hepatic Function Panel  No results found for: PROT, ALBUMIN, AST, ALT, ALKPHOS, BILITOT, BILIDIR, IBILI    Component Value Date/Time   TSH 1.060 10/15/2018 1017    OBESITY BEHAVIORAL INTERVENTION VISIT  Today's visit was # 2   Starting weight: 230 lbs Starting date: 10/15/2018 Today's weight : 216 lbs Today's date: 11/03/2018 Total lbs lost to date: 14    11/03/2018  Height 5\' 5"  (1.651 m)  Weight 216 lb (98 kg)  BMI (Calculated) 35.94  BLOOD PRESSURE - SYSTOLIC 527  BLOOD PRESSURE - DIASTOLIC 77   Body Fat % 78.2 %  Total Body Water (lbs) 79.6 lbs    ASK: We discussed the  diagnosis of obesity with Rebekah [name] today and Rebekah [name] agreed to give [name] permission to discuss  obesity behavioral modification therapy today.  ASSESS: Rebekah [name] has the diagnosis of obesity and her BMI today is 35.94 Rebekah [name] is in the action stage of change   ADVISE: Rebekah [name] was educated on the multiple health risks of obesity as well as the benefit of weight loss to improve her health. She was advised of the need for long term treatment and the importance of lifestyle modifications to improve her current health and to decrease her risk of future health problems.  AGREE: Multiple dietary modification options and treatment options were discussed and  Rebekah [name] agreed to follow the recommendations documented in the above note.  ARRANGE: Rebekah [name] was educated on the importance of frequent visits to treat obesity as outlined per CMS and USPSTF guidelines and agreed to schedule her next follow up appointment today.  Corey Skains, am acting as Location manager for [employer]. Owens Shark, DO  I have reviewed the above documentation for accuracy and completeness, and I agree with the above. -Jearld Lesch, DO

## 2018-11-04 ENCOUNTER — Encounter (INDEPENDENT_AMBULATORY_CARE_PROVIDER_SITE_OTHER): Payer: Self-pay

## 2018-11-04 ENCOUNTER — Ambulatory Visit (INDEPENDENT_AMBULATORY_CARE_PROVIDER_SITE_OTHER): Payer: Self-pay | Admitting: Bariatrics

## 2018-11-04 DIAGNOSIS — E559 Vitamin D deficiency, unspecified: Secondary | ICD-10-CM | POA: Insufficient documentation

## 2018-11-11 ENCOUNTER — Encounter (INDEPENDENT_AMBULATORY_CARE_PROVIDER_SITE_OTHER): Payer: Self-pay

## 2018-11-18 ENCOUNTER — Other Ambulatory Visit: Payer: Self-pay

## 2018-11-18 ENCOUNTER — Encounter (INDEPENDENT_AMBULATORY_CARE_PROVIDER_SITE_OTHER): Payer: Self-pay | Admitting: Bariatrics

## 2018-11-18 ENCOUNTER — Ambulatory Visit (INDEPENDENT_AMBULATORY_CARE_PROVIDER_SITE_OTHER): Payer: Federal, State, Local not specified - PPO | Admitting: Bariatrics

## 2018-11-18 DIAGNOSIS — Z6835 Body mass index (BMI) 35.0-35.9, adult: Secondary | ICD-10-CM

## 2018-11-18 DIAGNOSIS — E119 Type 2 diabetes mellitus without complications: Secondary | ICD-10-CM | POA: Diagnosis not present

## 2018-11-18 DIAGNOSIS — E559 Vitamin D deficiency, unspecified: Secondary | ICD-10-CM

## 2018-11-19 MED ORDER — VITAMIN D (ERGOCALCIFEROL) 1.25 MG (50000 UNIT) PO CAPS: 50000.0000 [IU] | ORAL_CAPSULE | ORAL | 0 refills | Status: DC

## 2018-11-19 NOTE — Progress Notes (Signed)
Office: [phone]  /  Fax: [phone] TeleHealth Visit:  Rebekah [name] has verbally consented to this TeleHealth visit today. The patient is located at home, the provider is located at the [facility] office. The participants in this visit include the listed provider and patient and any and all parties involved. The visit was conducted today via FaceTime.  HPI:   Chief Complaint: OBESITY Rebekah [name] is here to discuss her progress with her obesity treatment plan. She is on the Category 1 plan and is following her eating plan approximately 100 % of the time. She states she is walking 90 minutes 7 times per week. Rebekah [name] thinks that she has gained 1 pound. She has had some stress, due to the COVID-19 crisis. She has not been able to eat the bread. Rebekah [name] is cooking more. We were unable to weigh the patient today for this TeleHealth visit. She feels as if she has gained weight since her last visit. She has lost 13 lbs since starting treatment with [name].  Vitamin D deficiency Rebekah [name] has a diagnosis of vitamin D deficiency. Her last vitamin D level was at 20.0 She is currently taking vit D and denies nausea, vomiting or muscle weakness.  Diabetes II Rebekah [name] has a diagnosis of diabetes type II. Rebekah [name] is taking metformin and she denies any hypoglycemic episodes. Last A1c was at 7.0 She has been working on intensive lifestyle modifications including diet, exercise, and weight loss to help control her blood glucose levels.  ASSESSMENT AND PLAN:  Vitamin D deficiency - Plan: Vitamin D, Ergocalciferol, (DRISDOL) 1.25 MG (50000 UT) CAPS capsule  Type 2 diabetes mellitus without complication, without long-term current use of insulin (HCC)  Class 2 severe obesity with serious comorbidity and body mass index (BMI) of 35.0 to 35.9 in adult, unspecified obesity type ([place])  PLAN:  Vitamin D Deficiency Rebekah [name] was informed that low vitamin D levels contributes to fatigue and are associated with  obesity, breast, and colon cancer. She agrees to continue to take prescription Vit D @50 ,000 IU every week #4 with no refills and will follow up for routine testing of vitamin D, at least 2-3 times per year. She was informed of the risk of over-replacement of vitamin D and agrees to not increase her dose unless she discusses this with [name] first. Rebekah [name] agrees to follow up as directed.  Diabetes II Rebekah [name] has been given extensive diabetes education by myself today including ideal fasting and post-prandial blood glucose readings, individual ideal Hgb A1c goals and hypoglycemia prevention. We discussed the importance of good blood sugar control to decrease the likelihood of diabetic complications such as nephropathy, neuropathy, limb loss, blindness, coronary artery disease, and death. We discussed the importance of intensive lifestyle modification including diet, exercise and weight loss as the first line treatment for diabetes. Rebekah [name] will continue metformin and follow up at the agreed upon time.  Obesity Rebekah [name] is currently in the action stage of change. As such, her goal is to continue with weight loss efforts She has agreed to follow the Category 1 plan Rebekah [name] has been instructed to work up to a goal of 150 minutes of combined cardio and strengthening exercise per week for weight loss and overall health benefits. We discussed the following Behavioral Modification Strategies today: increase H2O intake, no skipping meals, keeping healthy foods in the home, increasing lean protein intake, decreasing simple carbohydrates, increasing vegetables, decrease eating out and work on meal planning and easy cooking plans Rebekah [name] will weigh herself  before each TeleHealth visit.  Rebekah [name] has agreed to follow up with our clinic in 2 weeks. She was informed of the importance of frequent follow up visits to maximize her success with intensive lifestyle modifications for her multiple health conditions.  ALLERGIES: Allergies   Allergen Reactions  . Crestor [Rosuvastatin] Other (See Comments)    myalgias    MEDICATIONS: Current Outpatient Medications on File Prior to Visit  Medication Sig Dispense Refill  . amLODipine (NORVASC) 5 MG tablet Take 1 tablet (5 mg total) by mouth daily. 90 tablet 3  . aspirin 81 MG chewable tablet Chew 81 mg by mouth daily.    Marland Kitchen estradiol (ESTRACE) 1 MG tablet Take 1 mg by mouth daily.    . furosemide (LASIX) 20 MG tablet Take 20 mg by mouth.    . hydrochlorothiazide (HYDRODIURIL) 25 MG tablet Take 25 mg by mouth daily.    Marland Kitchen HYDROcodone-acetaminophen (NORCO/VICODIN) 5-325 MG tablet Take 1-2 tablets by mouth every 6 (six) hours as needed for severe pain. 10 tablet 0  . ibuprofen (ADVIL,MOTRIN) 800 MG tablet Take 1 tablet (800 mg total) by mouth 3 (three) times daily. 21 tablet 0  . leflunomide (ARAVA) 20 MG tablet Take 20 mg by mouth daily.    Marland Kitchen lubiprostone (AMITIZA) 24 MCG capsule Take 24 mcg by mouth 2 (two) times daily as needed.    . metFORMIN (GLUCOPHAGE) 500 MG tablet Take 1 tablet (500 mg total) by mouth daily with breakfast. 30 tablet 0  . naproxen (NAPROSYN) 500 MG tablet Take 1 tablet (500 mg total) by mouth 2 (two) times daily. 20 tablet 0  . simvastatin (ZOCOR) 20 MG tablet Take 1 tablet by mouth every evening.  12  . Vitamin D, Ergocalciferol, (DRISDOL) 1.25 MG (50000 UT) CAPS capsule Take 1 capsule (50,000 Units total) by mouth every 7 (seven) days. 4 capsule 0   No current facility-administered medications on file prior to visit.     PAST MEDICAL HISTORY: Past Medical History:  Diagnosis Date  . Arthritis   . Diabetes mellitus type 2 in obese ([place]) 10/10/2017  . Essential hypertension 10/10/2017  . Hypertension   . Morbid obesity ([place]) 10/10/2017  . Pure hypercholesterolemia 10/10/2017  . Rheumatoid arteritis ([place])   . Vitamin D deficiency     PAST SURGICAL HISTORY: Past Surgical History:  Procedure Laterality Date  . ABDOMINAL HYSTERECTOMY    . biopsy on  tongue  1976  . BREAST EXCISIONAL BIOPSY Left   . CESAREAN SECTION    . TUBAL LIGATION      SOCIAL HISTORY: Social History   Tobacco Use  . Smoking status: Former Smoker    Last attempt to quit: 09/11/2010    Years since quitting: 8.1  . Smokeless tobacco: Never Used  Substance Use Topics  . Alcohol use: Yes    Comment: monthly   . Drug use: Not on file    FAMILY HISTORY: Family History  Problem Relation Age of Onset  . CAD Mother   . Cerebral aneurysm Father   . Stroke Sister   . CVA Sister   . Heart attack Maternal Grandmother   . Stroke Maternal Grandmother   . Alzheimer's disease Maternal Grandfather   . Cancer Paternal Grandmother     ROS: Review of Systems  Constitutional: Negative for weight loss.  Gastrointestinal: Negative for nausea and vomiting.  Musculoskeletal:       Negative for muscle weakness  Endo/Heme/Allergies:       Negative for hypoglycemia  PHYSICAL EXAM: Pt in no acute distress  RECENT LABS AND TESTS: BMET    Component Value Date/Time   NA 138 05/28/2017 1117   K 3.7 05/28/2017 1117   CL 103 05/28/2017 1117   CO2 27 05/28/2017 1117   GLUCOSE 147 (H) 05/28/2017 1117   BUN 19 05/28/2017 1117   CREATININE 0.84 05/28/2017 1117   CALCIUM 9.2 05/28/2017 1117   GFRNONAA >60 05/28/2017 1117   GFRAA >60 05/28/2017 1117   Lab Results  Component Value Date   HGBA1C 7.0 (H) 10/15/2018   Lab Results  Component Value Date   INSULIN 10.6 10/15/2018   CBC    Component Value Date/Time   WBC 9.7 05/28/2017 1117   RBC 4.12 05/28/2017 1117   HGB 12.0 05/28/2017 1117   HCT 36.8 05/28/2017 1117   PLT 219 05/28/2017 1117   MCV 89.3 05/28/2017 1117   MCH 29.1 05/28/2017 1117   MCHC 32.6 05/28/2017 1117   RDW 13.7 05/28/2017 1117   LYMPHSABS 1.6 11/04/2016 0247   MONOABS 0.7 11/04/2016 0247   EOSABS 0.0 11/04/2016 0247   BASOSABS 0.0 11/04/2016 0247   Iron/TIBC/Ferritin/ %Sat No results found for: IRON, TIBC, FERRITIN, IRONPCTSAT  Lipid Panel     Component Value Date/Time   CHOL 187 10/15/2018 1017   TRIG 75 10/15/2018 1017   HDL 67 10/15/2018 1017   LDLCALC 105 (H) 10/15/2018 1017   Hepatic Function Panel  No results found for: PROT, ALBUMIN, AST, ALT, ALKPHOS, BILITOT, BILIDIR, IBILI    Component Value Date/Time   TSH 1.060 10/15/2018 1017     I, Doreene Nest, am acting as Location manager for [employer]. Owens Shark, DO  I have reviewed the above documentation for accuracy and completeness, and I agree with the above. -Jearld Lesch, DO

## 2018-11-25 DIAGNOSIS — J3489 Other specified disorders of nose and nasal sinuses: Secondary | ICD-10-CM | POA: Diagnosis not present

## 2018-11-27 ENCOUNTER — Other Ambulatory Visit (INDEPENDENT_AMBULATORY_CARE_PROVIDER_SITE_OTHER): Payer: Self-pay | Admitting: Bariatrics

## 2018-11-27 DIAGNOSIS — E119 Type 2 diabetes mellitus without complications: Secondary | ICD-10-CM

## 2018-12-02 ENCOUNTER — Other Ambulatory Visit: Payer: Self-pay

## 2018-12-02 ENCOUNTER — Other Ambulatory Visit: Payer: Self-pay | Admitting: Cardiovascular Disease

## 2018-12-02 ENCOUNTER — Ambulatory Visit (INDEPENDENT_AMBULATORY_CARE_PROVIDER_SITE_OTHER): Payer: Federal, State, Local not specified - PPO | Admitting: Bariatrics

## 2018-12-02 ENCOUNTER — Encounter (INDEPENDENT_AMBULATORY_CARE_PROVIDER_SITE_OTHER): Payer: Self-pay | Admitting: Bariatrics

## 2018-12-02 DIAGNOSIS — E559 Vitamin D deficiency, unspecified: Secondary | ICD-10-CM | POA: Diagnosis not present

## 2018-12-02 DIAGNOSIS — E119 Type 2 diabetes mellitus without complications: Secondary | ICD-10-CM

## 2018-12-02 DIAGNOSIS — Z6835 Body mass index (BMI) 35.0-35.9, adult: Secondary | ICD-10-CM

## 2018-12-02 DIAGNOSIS — I10 Essential (primary) hypertension: Secondary | ICD-10-CM | POA: Diagnosis not present

## 2018-12-02 MED ORDER — METFORMIN HCL 500 MG PO TABS: 500.0000 mg | ORAL_TABLET | Freq: Two times a day (BID) | ORAL | 0 refills | Status: DC

## 2018-12-02 NOTE — Telephone Encounter (Signed)
Amlodipine 5 mg refilled. 

## 2018-12-03 NOTE — Progress Notes (Signed)
Office: [phone]  /  Fax: [phone] TeleHealth Visit:  [name] has verbally consented to this TeleHealth visit today. The patient is located at home, the provider is located at the [facility] office. The participants in this visit include the listed provider and patient and any and all parties involved. The visit was conducted today via FaceTime.  HPI:   Chief Complaint: OBESITY Rebekah [name] is here to discuss her progress with her obesity treatment plan. She is on the Category 2 plan and is following her eating plan approximately 95 % of the time. She states she is walking 1 mile on the treadmill 7 days per week and she is exercising 14 minutes 3 times per week. Rebekah [name] has stayed the same. She is working from home. We were unable to weigh the patient today for this TeleHealth visit. She feels as if she has maintained weight since her last visit. She has lost 13 lbs since starting treatment with [name].  Diabetes II Rebekah [name] has a diagnosis of diabetes type II. She is currently taking metformin. Rebekah [name] admits to polyphagia and denies any hypoglycemic episodes. Last A1c was 7.0 and last insulin level was at 10.6 She has been working on intensive lifestyle modifications including diet, exercise, and weight loss to help control her blood glucose levels.  Vitamin D deficiency Rebekah [name] has a diagnosis of vitamin D deficiency. She is currently taking high dose vit D and denies nausea, vomiting or muscle weakness.  Hypertension Rebekah [name] is a 63 y.o. female with hypertension. She is currently taking Norvasc and HCTZ. Her blood pressure is 150/80. Rebekah [name] denies chest pain or shortness of breath on exertion. She is working weight loss to help control her blood pressure with the goal of decreasing her risk of heart attack and stroke. Rebekah [name] blood pressure is well controlled.  ASSESSMENT AND PLAN:  Type 2 diabetes mellitus without complication, without long-term  current use of insulin (HCC) - Plan: metFORMIN (GLUCOPHAGE) 500 MG tablet  Vitamin D deficiency  Essential hypertension  Class 2 severe obesity with serious comorbidity and body mass index (BMI) of 35.0 to 35.9 in adult, unspecified obesity type ([place])  PLAN:  Diabetes II Rebekah [name] has been given extensive diabetes education by myself today including ideal fasting and post-prandial blood glucose readings, individual ideal Hgb A1c goals and hypoglycemia prevention. We discussed the importance of good blood sugar control to decrease the likelihood of diabetic complications such as nephropathy, neuropathy, limb loss, blindness, coronary artery disease, and death. We discussed the importance of intensive lifestyle modification including diet, exercise and weight loss as the first line treatment for diabetes. Rebekah [name] will continue to track her blood sugars. She agrees to increase metformin 500 mg to 2 times daily #60 with no refills and follow up at the agreed upon time.  Vitamin D Deficiency Rebekah [name] was informed that low vitamin D levels contributes to fatigue and are associated with obesity, breast, and colon cancer. She will continue to take prescription Vit D @50 ,000 IU weekly and will follow up for routine testing of vitamin D, at least 2-3 times per year. She was informed of the risk of over-replacement of vitamin D and agrees to not increase her dose unless she discusses this with [name] first.  Hypertension We discussed sodium restriction, working on healthy weight loss, and a regular exercise program as the means to achieve improved blood pressure control. Rebekah [name] agreed with this plan and agreed to follow up as directed. We will  continue to monitor her blood pressure as well as her progress with the above lifestyle modifications. She will continue her medications as prescribed and will watch for signs of hypotension as she continues her lifestyle modifications.  Obesity Rebekah [name] is currently in the action  stage of change. As such, her goal is to continue with weight loss efforts She has agreed to follow the Category 2 plan Rebekah [name] will continue exercise (treadmill) and continue 7 minutes of exercises for weight loss and overall health benefits. We discussed the following Behavioral Modification Strategies today: planning for success, increase H2O intake, no skipping meals, keeping healthy foods in the home, increasing lean protein intake, decreasing simple carbohydrates, increasing vegetables, decrease eating out and work on meal planning and easy cooking plans Rebekah [name] will weigh herself at home. She will save a snack for the evening. Rebekah [name] will use Fit-Bit.  Rebekah [name] has agreed to follow up with our clinic in 2 weeks. She was informed of the importance of frequent follow up visits to maximize her success with intensive lifestyle modifications for her multiple health conditions.  ALLERGIES: Allergies  Allergen Reactions   Crestor [Rosuvastatin] Other (See Comments)    myalgias    MEDICATIONS: Current Outpatient Medications on File Prior to Visit  Medication Sig Dispense Refill   aspirin 81 MG chewable tablet Chew 81 mg by mouth daily.     estradiol (ESTRACE) 1 MG tablet Take 1 mg by mouth daily.     furosemide (LASIX) 20 MG tablet Take 20 mg by mouth.     hydrochlorothiazide (HYDRODIURIL) 25 MG tablet Take 25 mg by mouth daily.     HYDROcodone-acetaminophen (NORCO/VICODIN) 5-325 MG tablet Take 1-2 tablets by mouth every 6 (six) hours as needed for severe pain. 10 tablet 0   ibuprofen (ADVIL,MOTRIN) 800 MG tablet Take 1 tablet (800 mg total) by mouth 3 (three) times daily. 21 tablet 0   leflunomide (ARAVA) 20 MG tablet Take 20 mg by mouth daily.     lubiprostone (AMITIZA) 24 MCG capsule Take 24 mcg by mouth 2 (two) times daily as needed.     naproxen (NAPROSYN) 500 MG tablet Take 1 tablet (500 mg total) by mouth 2 (two) times daily. 20 tablet 0   simvastatin (ZOCOR) 20 MG tablet Take 1  tablet by mouth every evening.  12   Vitamin D, Ergocalciferol, (DRISDOL) 1.25 MG (50000 UT) CAPS capsule Take 1 capsule (50,000 Units total) by mouth every 7 (seven) days. 4 capsule 0   No current facility-administered medications on file prior to visit.     PAST MEDICAL HISTORY: Past Medical History:  Diagnosis Date   Arthritis    Diabetes mellitus type 2 in obese ([place]) 10/10/2017   Essential hypertension 10/10/2017   Hypertension    Morbid obesity ([place]) 10/10/2017   Pure hypercholesterolemia 10/10/2017   Rheumatoid arteritis ([place])    Vitamin D deficiency     PAST SURGICAL HISTORY: Past Surgical History:  Procedure Laterality Date   ABDOMINAL HYSTERECTOMY     biopsy on tongue  1976   BREAST EXCISIONAL BIOPSY Left    CESAREAN SECTION     TUBAL LIGATION      SOCIAL HISTORY: Social History   Tobacco Use   Smoking status: Former Smoker    Last attempt to quit: 09/11/2010    Years since quitting: 8.2   Smokeless tobacco: Never Used  Substance Use Topics   Alcohol use: Yes    Comment: monthly    Drug use: Not on file  FAMILY HISTORY: Family History  Problem Relation Age of Onset   CAD Mother    Cerebral aneurysm Father    Stroke Sister    CVA Sister    Heart attack Maternal Grandmother    Stroke Maternal Grandmother    Alzheimer's disease Maternal Grandfather    Cancer Paternal Grandmother     ROS: Review of Systems  Constitutional: Negative for weight loss.  Respiratory: Negative for shortness of breath (on exertion).   Cardiovascular: Negative for chest pain.  Gastrointestinal: Negative for nausea and vomiting.  Musculoskeletal:       Negative for muscle weakness  Endo/Heme/Allergies:       Positive for polyphagia Negative for hypoglycemia    PHYSICAL EXAM: Pt in no acute distress  RECENT LABS AND TESTS: BMET    Component Value Date/Time   NA 138 05/28/2017 1117   K 3.7 05/28/2017 1117   CL 103 05/28/2017 1117   CO2  27 05/28/2017 1117   GLUCOSE 147 (H) 05/28/2017 1117   BUN 19 05/28/2017 1117   CREATININE 0.84 05/28/2017 1117   CALCIUM 9.2 05/28/2017 1117   GFRNONAA >60 05/28/2017 1117   GFRAA >60 05/28/2017 1117   Lab Results  Component Value Date   HGBA1C 7.0 (H) 10/15/2018   Lab Results  Component Value Date   INSULIN 10.6 10/15/2018   CBC    Component Value Date/Time   WBC 9.7 05/28/2017 1117   RBC 4.12 05/28/2017 1117   HGB 12.0 05/28/2017 1117   HCT 36.8 05/28/2017 1117   PLT 219 05/28/2017 1117   MCV 89.3 05/28/2017 1117   MCH 29.1 05/28/2017 1117   MCHC 32.6 05/28/2017 1117   RDW 13.7 05/28/2017 1117   LYMPHSABS 1.6 11/04/2016 0247   MONOABS 0.7 11/04/2016 0247   EOSABS 0.0 11/04/2016 0247   BASOSABS 0.0 11/04/2016 0247   Iron/TIBC/Ferritin/ %Sat No results found for: IRON, TIBC, FERRITIN, IRONPCTSAT Lipid Panel     Component Value Date/Time   CHOL 187 10/15/2018 1017   TRIG 75 10/15/2018 1017   HDL 67 10/15/2018 1017   LDLCALC 105 (H) 10/15/2018 1017   Hepatic Function Panel  No results found for: PROT, ALBUMIN, AST, ALT, ALKPHOS, BILITOT, BILIDIR, IBILI    Component Value Date/Time   TSH 1.060 10/15/2018 1017      I, Doreene Nest, am acting as Location manager for [employer]. Owens Shark, DO  I have reviewed the above documentation for accuracy and completeness, and I agree with the above. -Jearld Lesch, DO

## 2018-12-08 ENCOUNTER — Other Ambulatory Visit (INDEPENDENT_AMBULATORY_CARE_PROVIDER_SITE_OTHER): Payer: Self-pay | Admitting: Bariatrics

## 2018-12-08 DIAGNOSIS — E119 Type 2 diabetes mellitus without complications: Secondary | ICD-10-CM

## 2018-12-10 ENCOUNTER — Other Ambulatory Visit (INDEPENDENT_AMBULATORY_CARE_PROVIDER_SITE_OTHER): Payer: Self-pay | Admitting: Bariatrics

## 2018-12-10 DIAGNOSIS — E119 Type 2 diabetes mellitus without complications: Secondary | ICD-10-CM

## 2018-12-16 ENCOUNTER — Ambulatory Visit (INDEPENDENT_AMBULATORY_CARE_PROVIDER_SITE_OTHER): Payer: Federal, State, Local not specified - PPO | Admitting: Bariatrics

## 2018-12-16 ENCOUNTER — Encounter (INDEPENDENT_AMBULATORY_CARE_PROVIDER_SITE_OTHER): Payer: Self-pay | Admitting: Bariatrics

## 2018-12-16 ENCOUNTER — Other Ambulatory Visit: Payer: Self-pay

## 2018-12-16 DIAGNOSIS — I10 Essential (primary) hypertension: Secondary | ICD-10-CM | POA: Diagnosis not present

## 2018-12-16 DIAGNOSIS — Z6835 Body mass index (BMI) 35.0-35.9, adult: Secondary | ICD-10-CM | POA: Diagnosis not present

## 2018-12-16 DIAGNOSIS — E119 Type 2 diabetes mellitus without complications: Secondary | ICD-10-CM

## 2018-12-17 NOTE — Progress Notes (Signed)
Office: [phone]  /  Fax: [phone] TeleHealth Visit:  Rebekah [name] has verbally consented to this TeleHealth visit today. The patient is located at home, the provider is located at the [employer] [facility] office. The participants in this visit include the listed provider and patient and any and all parties involved. The visit was conducted today via FaceTime.  HPI:   Chief Complaint: OBESITY Rebekah [name] is here to discuss her progress with her obesity treatment plan. She is on the Category 2 plan and is following her eating plan approximately 75 % of the time. She states she is walking 15 minutes 5 times per week. [place] thinks that she has stayed the same weight (weight 215 lbs). She is not stress eating. We were unable to weigh the patient today for this TeleHealth visit. She feels as if she has maintained weight since her last visit. She has lost 15 lbs since starting treatment with [name].  Diabetes II Rebekah [name] has a diagnosis of diabetes type II. [name] states fasting BGs range in the 130's and she denies any hypoglycemic episodes. Her appetite is normal. Last A1c was at 7.0 and last insulin level was at 10.6 She has been working on intensive lifestyle modifications including diet, exercise, and weight loss to help control her blood glucose levels.  Hypertension Rebekah [name] is a 63 y.o. female with hypertension. She is taking Norvasc and HCTZ. Her blood pressure reading is 101/51 and has been reasonably well controlled. Rebekah [name] denies chest pain or shortness of breath on exertion. She is working weight loss to help control her blood pressure with the goal of decreasing her risk of heart attack and stroke.  ASSESSMENT AND PLAN:  Type 2 diabetes mellitus without complication, without long-term current use of insulin (HCC)  Essential hypertension  Class 2 severe obesity with serious comorbidity and body mass index (BMI) of 35.0 to 35.9 in adult, unspecified obesity  type ([place])  PLAN:  Diabetes II Rebekah [name] has been given extensive diabetes education by myself today including ideal fasting and post-prandial blood glucose readings, individual ideal Hgb A1c goals  and hypoglycemia prevention. We discussed the importance of good blood sugar control to decrease the likelihood of diabetic complications such as nephropathy, neuropathy, limb loss, blindness, coronary artery disease, and death. We discussed the importance of intensive lifestyle modification including diet, exercise and weight loss as the first line treatment for diabetes. Rebekah [name] agrees to continue her diabetes medications and will follow up at the agreed upon time.  Hypertension We discussed sodium restriction, working on healthy weight loss, and a regular exercise program as the means to achieve improved blood pressure control. Rebekah [name] agreed with this plan and agreed to follow up as directed. We will continue to monitor her blood pressure as well as her progress with the above lifestyle modifications. She will continue her medications as prescribed and will watch for signs of hypotension as she continues her lifestyle modifications.  Obesity Rebekah [name] is currently in the action stage of change. As such, her goal is to continue with weight loss efforts She has agreed to follow the Category 2 plan Rebekah [name] will continue exercise and increase over time for weight loss and overall health benefits. We discussed the following Behavioral Modification Strategies today: increase H2O intake, no skipping meals, keeping healthy foods in the home, increasing lean protein intake, decreasing simple carbohydrates, increasing vegetables, decrease eating out and work on meal planning and easy cooking plans Rebekah [name] will continue to weigh herself  at home and record before each visit.  Rebekah [name] has agreed to follow up with our clinic in 2 weeks. She was informed of the importance of frequent follow up visits to maximize her success with  intensive lifestyle modifications for her multiple health conditions.  ALLERGIES: Allergies  Allergen Reactions  . Crestor [Rosuvastatin] Other (See Comments)    myalgias    MEDICATIONS: Current Outpatient Medications on File Prior to Visit  Medication Sig Dispense Refill  . amLODipine (NORVASC) 5 MG tablet TAKE 1 TABLET BY MOUTH EVERY DAY 90 tablet 3  . aspirin 81 MG chewable tablet Chew 81 mg by mouth daily.    Marland Kitchen estradiol (ESTRACE) 1 MG tablet Take 1 mg by mouth daily.    . furosemide (LASIX) 20 MG tablet Take 20 mg by mouth.    . hydrochlorothiazide (HYDRODIURIL) 25 MG tablet Take 25 mg by mouth daily.    Marland Kitchen HYDROcodone-acetaminophen (NORCO/VICODIN) 5-325 MG tablet Take 1-2 tablets by mouth every 6 (six) hours as needed for severe pain. 10 tablet 0  . ibuprofen (ADVIL,MOTRIN) 800 MG tablet Take 1 tablet (800 mg total) by mouth 3 (three) times daily. 21 tablet 0  . leflunomide (ARAVA) 20 MG tablet Take 20 mg by mouth daily.    Marland Kitchen lubiprostone (AMITIZA) 24 MCG capsule Take 24 mcg by mouth 2 (two) times daily as needed.    . metFORMIN (GLUCOPHAGE) 500 MG tablet Take 1 tablet (500 mg total) by mouth 2 (two) times daily with a meal. 60 tablet 0  . naproxen (NAPROSYN) 500 MG tablet Take 1 tablet (500 mg total) by mouth 2 (two) times daily. 20 tablet 0  . simvastatin (ZOCOR) 20 MG tablet Take 1 tablet by mouth every evening.  12  . Vitamin D, Ergocalciferol, (DRISDOL) 1.25 MG (50000 UT) CAPS capsule Take 1 capsule (50,000 Units total) by mouth every 7 (seven) days. 4 capsule 0   No current facility-administered medications on file prior to visit.     PAST MEDICAL HISTORY: Past Medical History:  Diagnosis Date  . Arthritis   . Diabetes mellitus type 2 in obese ([place]) 10/10/2017  . Essential hypertension 10/10/2017  . Hypertension   . Morbid obesity ([place]) 10/10/2017  . Pure hypercholesterolemia 10/10/2017  . Rheumatoid arteritis ([place])   . Vitamin D deficiency     PAST SURGICAL HISTORY:  Past Surgical History:  Procedure Laterality Date  . ABDOMINAL HYSTERECTOMY    . biopsy on tongue  1976  . BREAST EXCISIONAL BIOPSY Left   . CESAREAN SECTION    . TUBAL LIGATION      SOCIAL HISTORY: Social History   Tobacco Use  . Smoking status: Former Smoker    Last attempt to quit: 09/11/2010    Years since quitting: 8.2  . Smokeless tobacco: Never Used  Substance Use Topics  . Alcohol use: Yes    Comment: monthly   . Drug use: Not on file    FAMILY HISTORY: Family History  Problem Relation Age of Onset  . CAD Mother   . Cerebral aneurysm Father   . Stroke Sister   . CVA Sister   . Heart attack Maternal Grandmother   . Stroke Maternal Grandmother   . Alzheimer's disease Maternal Grandfather   . Cancer Paternal Grandmother     ROS: Review of Systems  Constitutional: Negative for weight loss.  Respiratory: Negative for shortness of breath (on exertion).   Cardiovascular: Negative for chest pain.  Endo/Heme/Allergies:       Negative  for polyphagia Negative for hypoglycemia    PHYSICAL EXAM: Pt in no acute distress  RECENT LABS AND TESTS: BMET    Component Value Date/Time   NA 138 05/28/2017 1117   K 3.7 05/28/2017 1117   CL 103 05/28/2017 1117   CO2 27 05/28/2017 1117   GLUCOSE 147 (H) 05/28/2017 1117   BUN 19 05/28/2017 1117   CREATININE 0.84 05/28/2017 1117   CALCIUM 9.2 05/28/2017 1117   GFRNONAA >60 05/28/2017 1117   GFRAA >60 05/28/2017 1117   Lab Results  Component Value Date   HGBA1C 7.0 (H) 10/15/2018   Lab Results  Component Value Date   INSULIN 10.6 10/15/2018   CBC    Component Value Date/Time   WBC 9.7 05/28/2017 1117   RBC 4.12 05/28/2017 1117   HGB 12.0 05/28/2017 1117   HCT 36.8 05/28/2017 1117   PLT 219 05/28/2017 1117   MCV 89.3 05/28/2017 1117   MCH 29.1 05/28/2017 1117   MCHC 32.6 05/28/2017 1117   RDW 13.7 05/28/2017 1117   LYMPHSABS 1.6 11/04/2016 0247   MONOABS 0.7 11/04/2016 0247   EOSABS 0.0 11/04/2016 0247    BASOSABS 0.0 11/04/2016 0247   Iron/TIBC/Ferritin/ %Sat No results found for: IRON, TIBC, FERRITIN, IRONPCTSAT Lipid Panel     Component Value Date/Time   CHOL 187 10/15/2018 1017   TRIG 75 10/15/2018 1017   HDL 67 10/15/2018 1017   LDLCALC 105 (H) 10/15/2018 1017   Hepatic Function Panel  No results found for: PROT, ALBUMIN, AST, ALT, ALKPHOS, BILITOT, BILIDIR, IBILI    Component Value Date/Time   TSH 1.060 10/15/2018 1017      I, Doreene Nest, am acting as Location manager for [employer]. Owens Shark, DO    I have reviewed the above documentation for accuracy and completeness, and I agree with the above. -Jearld Lesch, DO

## 2018-12-18 ENCOUNTER — Encounter (INDEPENDENT_AMBULATORY_CARE_PROVIDER_SITE_OTHER): Payer: Self-pay | Admitting: Bariatrics

## 2018-12-23 ENCOUNTER — Encounter: Payer: Self-pay | Admitting: *Deleted

## 2018-12-30 ENCOUNTER — Ambulatory Visit (INDEPENDENT_AMBULATORY_CARE_PROVIDER_SITE_OTHER): Payer: Federal, State, Local not specified - PPO | Admitting: Bariatrics

## 2018-12-31 ENCOUNTER — Other Ambulatory Visit: Payer: Self-pay

## 2018-12-31 ENCOUNTER — Ambulatory Visit (INDEPENDENT_AMBULATORY_CARE_PROVIDER_SITE_OTHER): Payer: Federal, State, Local not specified - PPO | Admitting: Bariatrics

## 2018-12-31 DIAGNOSIS — Z6835 Body mass index (BMI) 35.0-35.9, adult: Secondary | ICD-10-CM

## 2018-12-31 DIAGNOSIS — I10 Essential (primary) hypertension: Secondary | ICD-10-CM | POA: Diagnosis not present

## 2018-12-31 DIAGNOSIS — E119 Type 2 diabetes mellitus without complications: Secondary | ICD-10-CM | POA: Diagnosis not present

## 2019-01-01 ENCOUNTER — Encounter (INDEPENDENT_AMBULATORY_CARE_PROVIDER_SITE_OTHER): Payer: Self-pay | Admitting: Bariatrics

## 2019-01-01 NOTE — Progress Notes (Signed)
Office: [phone]  /  Fax: [phone] TeleHealth Visit:  Rebekah [name] has verbally consented to this TeleHealth visit today. The patient is located at home, the provider is located at the [employer] [facility] office. The participants in this visit include the listed provider and patient and any and all parties involved. The visit was conducted today via FaceTime.  HPI:   Chief Complaint: OBESITY Rebekah [name] is here to discuss her progress with her obesity treatment plan. She is on the Category 3 plan and is following her eating plan approximately 100 % of the time. She states she is doing cardio exercise for 30 minutes 3 times per week. Rebekah [name] states that she has lost 3 pounds (weight 212 lbs). She states that yesterday and today, she has had indigestion and a decreased appetite. We were unable to weigh the patient today for this TeleHealth visit. She feels as if she has lost weight since her last visit. She has lost 18 lbs since starting treatment with [name].  Diabetes II Rebekah [name] has a diagnosis of diabetes type II. Rebekah [name] states fasting BGs range in the 150's and she denies any hypoglycemic episodes. Last A1c was at 7.0 She has been working on intensive lifestyle modifications including diet, exercise, and weight loss to help control her blood glucose levels.  Hypertension Rebekah [name] is a 63 y.o. female with hypertension. She is taking Norvasc and HCTZ. Rebekah [name] denies chest pain or shortness of breath on exertion. She is working weight loss to help control her blood pressure with the goal of decreasing her risk of heart attack and stroke. [name] blood pressure is reasonably well controlled.  ASSESSMENT AND PLAN:  Type 2 diabetes mellitus without complication, without long-term current use of insulin (HCC)  Essential hypertension  Class 2 severe obesity with serious comorbidity and body mass index (BMI) of 35.0 to 35.9 in adult, unspecified obesity type (Rebekah [name])  PLAN:   Diabetes II Rebekah [name] has been given extensive diabetes education by myself today including ideal fasting and post-prandial blood glucose readings, individual ideal Hgb A1c goals and hypoglycemia prevention. We discussed the importance of good blood sugar control to decrease the likelihood of diabetic complications such as nephropathy, neuropathy, limb loss, blindness, coronary artery disease, and death. We discussed the importance of intensive lifestyle modification including diet, exercise and weight loss as the first line treatment for diabetes. Rebekah [name] agrees to continue her diabetes medications and will follow up at the agreed upon time.  Hypertension We discussed sodium restriction, working on healthy weight loss, and a regular exercise program as the means to achieve improved blood pressure control. Rebekah [name] agreed with this plan and agreed to follow up as directed. We will continue to monitor her blood pressure as well as her progress with the above lifestyle modifications. She will continue her medications as prescribed and will watch for signs of hypotension as she continues her lifestyle modifications.  Obesity Rebekah [name] is currently in the action stage of change. As such, her goal is to continue with weight loss efforts She has agreed to follow the Category 3 plan Rebekah [name] will continue exercise (dancing) for weight loss and overall health benefits and she will use her FitBit. We discussed the following Behavioral Modification Strategies today: increase H2O intake, no skipping meals, keeping healthy foods in the home, increasing lean protein intake, decreasing simple carbohydrates, increasing vegetables, decrease eating out and work on meal planning and intentional eating Rebekah [name] will weigh herself at home and record before each  visit.  Rebekah [name] has agreed to follow up with our clinic in 2 weeks. She was informed of the importance of frequent follow up visits to maximize her success with intensive lifestyle  modifications for her multiple health conditions.  ALLERGIES: Allergies  Allergen Reactions  . Crestor [Rosuvastatin] Other (See Comments)    myalgias    MEDICATIONS: Current Outpatient Medications on File Prior to Visit  Medication Sig Dispense Refill  . amLODipine (NORVASC) 5 MG tablet TAKE 1 TABLET BY MOUTH EVERY DAY 90 tablet 3  . aspirin 81 MG chewable tablet Chew 81 mg by mouth daily.    Marland Kitchen estradiol (ESTRACE) 1 MG tablet Take 1 mg by mouth daily.    . furosemide (LASIX) 20 MG tablet Take 20 mg by mouth.    . hydrochlorothiazide (HYDRODIURIL) 25 MG tablet Take 25 mg by mouth daily.    Marland Kitchen HYDROcodone-acetaminophen (NORCO/VICODIN) 5-325 MG tablet Take 1-2 tablets by mouth every 6 (six) hours as needed for severe pain. 10 tablet 0  . ibuprofen (ADVIL,MOTRIN) 800 MG tablet Take 1 tablet (800 mg total) by mouth 3 (three) times daily. 21 tablet 0  . leflunomide (ARAVA) 20 MG tablet Take 20 mg by mouth daily.    Marland Kitchen lubiprostone (AMITIZA) 24 MCG capsule Take 24 mcg by mouth 2 (two) times daily as needed.    . metFORMIN (GLUCOPHAGE) 500 MG tablet Take 1 tablet (500 mg total) by mouth 2 (two) times daily with a meal. 60 tablet 0  . naproxen (NAPROSYN) 500 MG tablet Take 1 tablet (500 mg total) by mouth 2 (two) times daily. 20 tablet 0  . simvastatin (ZOCOR) 20 MG tablet Take 1 tablet by mouth every evening.  12  . Vitamin D, Ergocalciferol, (DRISDOL) 1.25 MG (50000 UT) CAPS capsule Take 1 capsule (50,000 Units total) by mouth every 7 (seven) days. 4 capsule 0   No current facility-administered medications on file prior to visit.     PAST MEDICAL HISTORY: Past Medical History:  Diagnosis Date  . Arthritis   . Diabetes mellitus type 2 in obese ([place]) 10/10/2017  . Essential hypertension 10/10/2017  . Hypertension   . Morbid obesity ([place]) 10/10/2017  . Pure hypercholesterolemia 10/10/2017  . Rheumatoid arteritis ([place])   . Vitamin D deficiency     PAST SURGICAL HISTORY: Past Surgical  History:  Procedure Laterality Date  . ABDOMINAL HYSTERECTOMY    . biopsy on tongue  1976  . BREAST EXCISIONAL BIOPSY Left   . CESAREAN SECTION    . TUBAL LIGATION      SOCIAL HISTORY: Social History   Tobacco Use  . Smoking status: Former Smoker    Last attempt to quit: 09/11/2010    Years since quitting: 8.3  . Smokeless tobacco: Never Used  Substance Use Topics  . Alcohol use: Yes    Comment: monthly   . Drug use: Not on file    FAMILY HISTORY: Family History  Problem Relation Age of Onset  . CAD Mother   . Cerebral aneurysm Father   . Stroke Sister   . CVA Sister   . Heart attack Maternal Grandmother   . Stroke Maternal Grandmother   . Alzheimer's disease Maternal Grandfather   . Cancer Paternal Grandmother     ROS: Review of Systems  Constitutional: Positive for weight loss.  Respiratory: Negative for shortness of breath (on exertion).   Cardiovascular: Negative for chest pain.  Gastrointestinal:       Positive for indigestion  Endo/Heme/Allergies:  Negative for hypoglycemia    PHYSICAL EXAM: Pt in no acute distress  RECENT LABS AND TESTS: BMET    Component Value Date/Time   NA 138 05/28/2017 1117   K 3.7 05/28/2017 1117   CL 103 05/28/2017 1117   CO2 27 05/28/2017 1117   GLUCOSE 147 (H) 05/28/2017 1117   BUN 19 05/28/2017 1117   CREATININE 0.84 05/28/2017 1117   CALCIUM 9.2 05/28/2017 1117   GFRNONAA >60 05/28/2017 1117   GFRAA >60 05/28/2017 1117   Lab Results  Component Value Date   HGBA1C 7.0 (H) 10/15/2018   Lab Results  Component Value Date   INSULIN 10.6 10/15/2018   CBC    Component Value Date/Time   WBC 9.7 05/28/2017 1117   RBC 4.12 05/28/2017 1117   HGB 12.0 05/28/2017 1117   HCT 36.8 05/28/2017 1117   PLT 219 05/28/2017 1117   MCV 89.3 05/28/2017 1117   MCH 29.1 05/28/2017 1117   MCHC 32.6 05/28/2017 1117   RDW 13.7 05/28/2017 1117   LYMPHSABS 1.6 11/04/2016 0247   MONOABS 0.7 11/04/2016 0247   EOSABS 0.0  11/04/2016 0247   BASOSABS 0.0 11/04/2016 0247   Iron/TIBC/Ferritin/ %Sat No results found for: IRON, TIBC, FERRITIN, IRONPCTSAT Lipid Panel     Component Value Date/Time   CHOL 187 10/15/2018 1017   TRIG 75 10/15/2018 1017   HDL 67 10/15/2018 1017   LDLCALC 105 (H) 10/15/2018 1017   Hepatic Function Panel  No results found for: PROT, ALBUMIN, AST, ALT, ALKPHOS, BILITOT, BILIDIR, IBILI    Component Value Date/Time   TSH 1.060 10/15/2018 1017      I, Doreene Nest, am acting as Location manager for [employer]. Owens Shark, DO  I have reviewed the above documentation for accuracy and completeness, and I agree with the above. -Jearld Lesch, DO

## 2019-01-06 ENCOUNTER — Telehealth: Payer: Self-pay | Admitting: Cardiovascular Disease

## 2019-01-06 NOTE — Telephone Encounter (Signed)
Called x3 for pre reg/LVM °

## 2019-01-06 NOTE — Progress Notes (Signed)
Virtual Visit via Video Note   This visit type was conducted due to national recommendations for restrictions regarding the COVID-19 Pandemic (e.g. social distancing) in an effort to limit this patient's exposure and mitigate transmission in our community.  Due to her co-morbid illnesses, this patient is at least at moderate risk for complications without adequate follow up.  This format is felt to be most appropriate for this patient at this time.  All issues noted in this document were discussed and addressed.  A limited physical exam was performed with this format.  Please refer to the patient's chart for her consent to telehealth for [facility].   Date:  01/07/2019   ID:  ZIENNA AHLIN, DOB [birth year], MRN [id number]  Patient Location: Home Provider Location: Home  PCP:  Jonathon Jordan, MD  Cardiologist:  No primary care provider on file.  Electrophysiologist:  None   Evaluation Performed:  Follow-Up Visit  Chief Complaint:  hypertension  History of Present Illness:    Jodi L Greb is a 63 y.o. female with hypertension, hyperlipidemia, Rheumatoid arthritis and diabetes here for follow up.  She was initially seen 09/2017 for the evaluation of chest pain.  She initially presented to urgent care 05/2017 with chest pain.  At the time she reported exertional chest pain that occurred while walking on the treadmill or walking for exercise.  The episodes lasted for several hours at a time.  She felt as though her at the center of her chest hurt when she tried to take a deep breath. In the ED her EKG revealed LVH with repolarization abnormalities versus lateral ST elevations.  Cardiac enzymes were negative.  D-dimer was mildly elevated and she had recently flown across the country.  She had a chest CT that was negative for pulmonary embolism.  She followed up with Dr. Stephanie Acre and was referred to cardiology given her risk factors.  She had an ETT 10/2017 that was negative for ischemia.    Since her last appointment Ms. Carlyon has been doing well.  She participates in the [facility] program and has lost 15 lb. she has changed her diet and works out regularly on the treadmill.  She feels good with exercise and has no exertional chest pain or shortness of breath.  She also denies palpitations, lower extremity edema, orthopnea, PND, lightheadedness, or dizziness.  She is unsure why and when her rosuvastatin was switched to simvastatin.  The patient does not have symptoms concerning for COVID-19 infection (fever, chills, cough, or new shortness of breath).    Past Medical History:  Diagnosis Date  . Arthritis   . Diabetes mellitus type 2 in obese ([place]) 10/10/2017  . Essential hypertension 10/10/2017  . Hypertension   . Morbid obesity ([place]) 10/10/2017  . Pure hypercholesterolemia 10/10/2017  . Rheumatoid arteritis ([place])   . Vitamin D deficiency    Past Surgical History:  Procedure Laterality Date  . ABDOMINAL HYSTERECTOMY    . biopsy on tongue  1976  . BREAST EXCISIONAL BIOPSY Left   . CESAREAN SECTION    . TUBAL LIGATION       Current Meds  Medication Sig  . amLODipine (NORVASC) 5 MG tablet TAKE 1 TABLET BY MOUTH EVERY DAY  . aspirin 81 MG chewable tablet Chew 81 mg by mouth daily.  Marland Kitchen estradiol (ESTRACE) 1 MG tablet Take 1 mg by mouth daily.  . furosemide (LASIX) 20 MG tablet Take 20 mg by mouth.  . hydrochlorothiazide (HYDRODIURIL) 25  MG tablet Take 25 mg by mouth daily.  Marland Kitchen HYDROcodone-acetaminophen (NORCO/VICODIN) 5-325 MG tablet Take 1-2 tablets by mouth every 6 (six) hours as needed for severe pain.  Marland Kitchen ibuprofen (ADVIL,MOTRIN) 800 MG tablet Take 1 tablet (800 mg total) by mouth 3 (three) times daily.  Marland Kitchen leflunomide (ARAVA) 20 MG tablet Take 20 mg by mouth daily.  Marland Kitchen lubiprostone (AMITIZA) 24 MCG capsule Take 24 mcg by mouth 2 (two) times daily as needed.  . metFORMIN (GLUCOPHAGE) 500 MG tablet Take 1 tablet (500 mg total) by mouth 2 (two) times  daily with a meal.  . simvastatin (ZOCOR) 40 MG tablet Take 1 tablet (40 mg total) by mouth every evening.  . Vitamin D, Ergocalciferol, (DRISDOL) 1.25 MG (50000 UT) CAPS capsule Take 1 capsule (50,000 Units total) by mouth every 7 (seven) days.  . [DISCONTINUED] naproxen (NAPROSYN) 500 MG tablet Take 1 tablet (500 mg total) by mouth 2 (two) times daily.  . [DISCONTINUED] simvastatin (ZOCOR) 20 MG tablet Take 1 tablet by mouth every evening.     Allergies:   Crestor [rosuvastatin]   Social History   Tobacco Use  . Smoking status: Former Smoker    Last attempt to quit: 09/11/2010    Years since quitting: 8.3  . Smokeless tobacco: Never Used  Substance Use Topics  . Alcohol use: Yes    Comment: monthly   . Drug use: Not on file     Family Hx: The patient's family history includes Alzheimer's disease in her maternal grandfather; CAD in her mother; CVA in her sister; Cancer in her paternal grandmother; Cerebral aneurysm in her father; Heart attack in her maternal grandmother; Stroke in her maternal grandmother and sister.  ROS:   Please see the history of present illness.     All other systems reviewed and are negative.   Prior CV studies:   The following studies were reviewed today:  ETT 10/2017:  There was no ST segment deviation noted during stress.  37min, 41sec - good exercise time  Low risk exercise treadmill test with no electrocardiographic evidence of ischemia. Normal  Labs/Other Tests and Data Reviewed:    EKG:  No ECG reviewed.  Recent Labs: 10/15/2018: TSH 1.060   Recent Lipid Panel Lab Results  Component Value Date/Time   CHOL 187 10/15/2018 10:17 AM   TRIG 75 10/15/2018 10:17 AM   HDL 67 10/15/2018 10:17 AM   LDLCALC 105 (H) 10/15/2018 10:17 AM    Wt Readings from Last 3 Encounters:  01/07/19 215 lb (97.5 kg)  11/03/18 216 lb (98 kg)  10/15/18 230 lb (104.3 kg)     Objective:    Ht 5\' 5"  (1.651 m)   Wt 215 lb (97.5 kg)   LMP 08/13/1998   BMI  35.78 kg/m  GENERAL: Well-appearing.  No acute distress. HEENT: Pupils equal round.  Oral mucosa unremarkable NECK:  No jugular venous distention, no visible thyromegaly EXT:  No edema, no cyanosis no clubbing SKIN:  No rashes no nodules NEURO:  Speech fluent.  Cranial nerves grossly intact.  Moves all 4 extremities freely PSYCH:  Cognitively intact, oriented to person place and time   ASSESSMENT & PLAN:    # Exertional chest pain: Resolved.  ETT was negative for ischemia.  She exercises regularly and is doing well.  # Hypertension:  She is unable to check her blood pressure at home today but it has been well-controlled.  She continues to exercise and lose weight.  Continue amlodipine and HCTZ.  She only takes Lasix as needed and has not needed any lately.  # Hyperlipidemia:  LDL was 105 on 10/2018.  Increase simvastatin to 40mg .   Repeat lipids and a CMP in 3 months.  # Obesity: Encouraged exercise and continued weight loss. She is working with the [employer] [facility] program.   COVID-19 Education: The signs and symptoms of COVID-19 were discussed with the patient and how to seek care for testing (follow up with PCP or arrange E-visit).  The importance of social distancing was discussed today.  Time:   Today, I have spent 20 minutes with the patient with telehealth technology discussing the above problems.     Medication Adjustments/Labs and Tests Ordered: Current medicines are reviewed at length with the patient today.  Concerns regarding medicines are outlined above.   Tests Ordered: Orders Placed This Encounter  Procedures  . Lipid panel  . Comprehensive metabolic panel    Medication Changes: Meds ordered this encounter  Medications  . simvastatin (ZOCOR) 40 MG tablet    Sig: Take 1 tablet (40 mg total) by mouth every evening.    Dispense:  90 tablet    Refill:  3    D/C 20 MG RX, PT WILL CALL WHEN NEEDS FILLED    Disposition:  Follow up in 1 year(s)   Signed, Skeet Latch, MD  01/07/2019 12:35 PM    [facility]

## 2019-01-07 ENCOUNTER — Ambulatory Visit: Payer: Federal, State, Local not specified - PPO | Admitting: Cardiovascular Disease

## 2019-01-07 ENCOUNTER — Telehealth (INDEPENDENT_AMBULATORY_CARE_PROVIDER_SITE_OTHER): Payer: Federal, State, Local not specified - PPO | Admitting: Cardiovascular Disease

## 2019-01-07 VITALS — Ht 65.0 in | Wt 215.0 lb

## 2019-01-07 DIAGNOSIS — E1169 Type 2 diabetes mellitus with other specified complication: Secondary | ICD-10-CM

## 2019-01-07 DIAGNOSIS — I10 Essential (primary) hypertension: Secondary | ICD-10-CM | POA: Diagnosis not present

## 2019-01-07 DIAGNOSIS — E7800 Pure hypercholesterolemia, unspecified: Secondary | ICD-10-CM

## 2019-01-07 DIAGNOSIS — Z5181 Encounter for therapeutic drug level monitoring: Secondary | ICD-10-CM

## 2019-01-07 DIAGNOSIS — E669 Obesity, unspecified: Secondary | ICD-10-CM | POA: Diagnosis not present

## 2019-01-07 MED ORDER — SIMVASTATIN 40 MG PO TABS: 40.0000 mg | ORAL_TABLET | Freq: Every evening | ORAL | 3 refills | Status: DC

## 2019-01-07 NOTE — Telephone Encounter (Signed)
° ° °  Please call patient on home phone to register

## 2019-01-07 NOTE — Patient Instructions (Addendum)
Medication Instructions:   INCREASE SIMVASTATIN TO 40 MG DAILY   If you need a refill on your cardiac medications before your next appointment, please call your pharmacy.   Lab work: FASTING LP/CMET IN 2-3 MONTHS   If you have labs (blood work) drawn today and your tests are completely normal, you will receive your results only by: Marland Kitchen MyChart Message (if you have MyChart) OR . A paper copy in the [facility] If you have any lab test that is abnormal or we need to change your treatment, we will call you to review the results.  Testing/Procedures: NONE   Follow-Up: At [facility], you and your health needs are our priority.  As part of our continuing mission to provide you with exceptional heart care, we have created designated Provider Care Teams.  These Care Teams include your primary Cardiologist (physician) and Advanced Practice Providers (APPs -  Physician Assistants and Nurse Practitioners) who all work together to provide you with the care you need, when you need it. You will need a follow up appointment in 12 months.  Please call our office 2 months in advance to schedule this appointment.  You may see No primary care provider on file. or one of the following Advanced Practice Providers on your designated Care Team:   Kerin Ransom, PA-C [place], [place] . Sande Rives, PA-C

## 2019-01-14 ENCOUNTER — Encounter (INDEPENDENT_AMBULATORY_CARE_PROVIDER_SITE_OTHER): Payer: Self-pay | Admitting: Bariatrics

## 2019-01-14 ENCOUNTER — Other Ambulatory Visit: Payer: Self-pay

## 2019-01-14 ENCOUNTER — Ambulatory Visit (INDEPENDENT_AMBULATORY_CARE_PROVIDER_SITE_OTHER): Payer: Federal, State, Local not specified - PPO | Admitting: Bariatrics

## 2019-01-14 DIAGNOSIS — Z6835 Body mass index (BMI) 35.0-35.9, adult: Secondary | ICD-10-CM | POA: Diagnosis not present

## 2019-01-14 DIAGNOSIS — E119 Type 2 diabetes mellitus without complications: Secondary | ICD-10-CM | POA: Diagnosis not present

## 2019-01-14 DIAGNOSIS — E559 Vitamin D deficiency, unspecified: Secondary | ICD-10-CM | POA: Diagnosis not present

## 2019-01-14 MED ORDER — VITAMIN D (ERGOCALCIFEROL) 1.25 MG (50000 UNIT) PO CAPS: 50000.0000 [IU] | ORAL_CAPSULE | ORAL | 0 refills | Status: DC

## 2019-01-14 MED ORDER — METFORMIN HCL 500 MG PO TABS: 500.0000 mg | ORAL_TABLET | Freq: Two times a day (BID) | ORAL | 0 refills | Status: DC

## 2019-01-14 NOTE — Progress Notes (Signed)
Office: [phone]  /  Fax: [phone] TeleHealth Visit:  Rebekah [name] has verbally consented to this TeleHealth visit today. The patient is located at home, the provider is located at the [facility] office. The participants in this visit include the listed provider and patient and any and all parties involved. The visit was conducted today via FaceTime.  HPI:   Chief Complaint: OBESITY Rebekah [name] is here to discuss her progress with her obesity treatment plan. She is on the Category 2 plan and is following her eating plan approximately 95 % of the time. She states she is walking on the treadmill for 30 minutes 3 times per week. Rebekah [name] states that she has lost weight (weight 213 lbs). She is doing well. Rebekah [name] is drinking adequate water and she is getting her protein. Her goal weight is between 198 and 200 pounds. We were unable to weigh the patient today for this TeleHealth visit. She feels as if she has lost weight since her last visit. She has lost 27 lbs since starting treatment with [name].  Vitamin D deficiency Rebekah [name] has a diagnosis of vitamin D deficiency. She is currently taking vit D and denies nausea, vomiting or muscle weakness.  Diabetes II Rebekah [name] has a diagnosis of diabetes type II. She is taking Glucophage. Rebekah [name] states fasting BGs range between 110 and 120 and she denies any hypoglycemic episodes. Last A1c was at 7.0 and last insulin level was at 10.6 She has been working on intensive lifestyle modifications including diet, exercise, and weight loss to help control her blood glucose levels.  ASSESSMENT AND PLAN:  Class 2 severe obesity with serious comorbidity and body mass index (BMI) of 35.0 to 35.9 in adult, unspecified obesity type (HCC)  Vitamin D deficiency - Plan: Vitamin D, Ergocalciferol, (DRISDOL) 1.25 MG (50000 UT) CAPS capsule  Type 2 diabetes mellitus without complication, without long-term current use of insulin ([place]) - Plan: metFORMIN (GLUCOPHAGE)  500 MG tablet  PLAN:  Vitamin D Deficiency Rebekah [name] was informed that low vitamin D levels contributes to fatigue and are associated with obesity, breast, and colon cancer. She agrees to continue to take prescription Vit D @50 ,000 IU every week #4 with no refills and will follow up for routine testing of vitamin D, at least 2-3 times per year. She was informed of the risk of over-replacement of vitamin D and agrees to not increase her dose unless she discusses this with [name] first. Rebekah [name] agrees to follow up as directed.  Diabetes II Rebekah [name] has been given extensive diabetes education by myself today including ideal fasting and post-prandial blood glucose readings, individual ideal Hgb A1c goals  and hypoglycemia prevention. We discussed the importance of good blood sugar control to decrease the likelihood of diabetic complications such as nephropathy, neuropathy, limb loss, blindness, coronary artery disease, and death. We discussed the importance of intensive lifestyle modification including diet, exercise and weight loss as the first line treatment for diabetes. Rebekah [name] agrees to continue metformin  500 mg 1 tablet two times daily with a meal #60 with no refills and follow up at the agreed upon time.  Obesity Rebekah [name] is currently in the action stage of change. As such, her goal is to continue with weight loss efforts She has agreed to follow the Category 2 plan Rebekah [name] will continue her exercise regimen for weight loss and overall health benefits. We discussed the following Behavioral Modification Strategies today: increase H2O intake, no skipping meals, keeping healthy foods in the home, increasing  lean protein intake, decreasing simple carbohydrates, increasing vegetables, decrease eating out and work on meal planning and easy cooking plans Rebekah [name] will weigh herself at home and record.  Rebekah [name] has agreed to follow up with our clinic in 2 weeks. She was informed of the importance of frequent follow up visits  to maximize her success with intensive lifestyle modifications for her multiple health conditions.  ALLERGIES: Allergies  Allergen Reactions  . Crestor [Rosuvastatin] Other (See Comments)    myalgias    MEDICATIONS: Current Outpatient Medications on File Prior to Visit  Medication Sig Dispense Refill  . amLODipine (NORVASC) 5 MG tablet TAKE 1 TABLET BY MOUTH EVERY DAY 90 tablet 3  . aspirin 81 MG chewable tablet Chew 81 mg by mouth daily.    Marland Kitchen estradiol (ESTRACE) 1 MG tablet Take 1 mg by mouth daily.    . furosemide (LASIX) 20 MG tablet Take 20 mg by mouth.    . hydrochlorothiazide (HYDRODIURIL) 25 MG tablet Take 25 mg by mouth daily.    Marland Kitchen HYDROcodone-acetaminophen (NORCO/VICODIN) 5-325 MG tablet Take 1-2 tablets by mouth every 6 (six) hours as needed for severe pain. 10 tablet 0  . ibuprofen (ADVIL,MOTRIN) 800 MG tablet Take 1 tablet (800 mg total) by mouth 3 (three) times daily. 21 tablet 0  . leflunomide (ARAVA) 20 MG tablet Take 20 mg by mouth daily.    Marland Kitchen lubiprostone (AMITIZA) 24 MCG capsule Take 24 mcg by mouth 2 (two) times daily as needed.    . simvastatin (ZOCOR) 40 MG tablet Take 1 tablet (40 mg total) by mouth every evening. 90 tablet 3   No current facility-administered medications on file prior to visit.     PAST MEDICAL HISTORY: Past Medical History:  Diagnosis Date  . Arthritis   . Diabetes mellitus type 2 in obese ([name]) 10/10/2017  . Essential hypertension 10/10/2017  . Hypertension   . Morbid obesity ([name]) 10/10/2017  . Pure hypercholesterolemia 10/10/2017  . Rheumatoid arteritis ([name])   . Vitamin D deficiency     PAST SURGICAL HISTORY: Past Surgical History:  Procedure Laterality Date  . ABDOMINAL HYSTERECTOMY    . biopsy on tongue  1976  . BREAST EXCISIONAL BIOPSY Left   . CESAREAN SECTION    . TUBAL LIGATION      SOCIAL HISTORY: Social History   Tobacco Use  . Smoking status: Former Smoker    Last attempt to quit: 09/11/2010    Years since  quitting: 8.3  . Smokeless tobacco: Never Used  Substance Use Topics  . Alcohol use: Yes    Comment: monthly   . Drug use: Not on file    FAMILY HISTORY: Family History  Problem Relation Age of Onset  . CAD Mother   . Cerebral aneurysm Father   . Stroke Sister   . CVA Sister   . Heart attack Maternal Grandmother   . Stroke Maternal Grandmother   . Alzheimer's disease Maternal Grandfather   . Cancer Paternal Grandmother     ROS: Review of Systems  Constitutional: Positive for weight loss.  Gastrointestinal: Negative for nausea and vomiting.  Musculoskeletal:       Negative for muscle weakness  Endo/Heme/Allergies:       Negative for hypoglycemia    PHYSICAL EXAM: Pt in no acute distress  RECENT LABS AND TESTS: BMET    Component Value Date/Time   NA 138 05/28/2017 1117   K 3.7 05/28/2017 1117   CL 103 05/28/2017 1117   CO2 27  05/28/2017 1117   GLUCOSE 147 (H) 05/28/2017 1117   BUN 19 05/28/2017 1117   CREATININE 0.84 05/28/2017 1117   CALCIUM 9.2 05/28/2017 1117   GFRNONAA >60 05/28/2017 1117   GFRAA >60 05/28/2017 1117   Lab Results  Component Value Date   HGBA1C 7.0 (H) 10/15/2018   Lab Results  Component Value Date   INSULIN 10.6 10/15/2018   CBC    Component Value Date/Time   WBC 9.7 05/28/2017 1117   RBC 4.12 05/28/2017 1117   HGB 12.0 05/28/2017 1117   HCT 36.8 05/28/2017 1117   PLT 219 05/28/2017 1117   MCV 89.3 05/28/2017 1117   MCH 29.1 05/28/2017 1117   MCHC 32.6 05/28/2017 1117   RDW 13.7 05/28/2017 1117   LYMPHSABS 1.6 11/04/2016 0247   MONOABS 0.7 11/04/2016 0247   EOSABS 0.0 11/04/2016 0247   BASOSABS 0.0 11/04/2016 0247   Iron/TIBC/Ferritin/ %Sat No results found for: IRON, TIBC, FERRITIN, IRONPCTSAT Lipid Panel     Component Value Date/Time   CHOL 187 10/15/2018 1017   TRIG 75 10/15/2018 1017   HDL 67 10/15/2018 1017   LDLCALC 105 (H) 10/15/2018 1017   Hepatic Function Panel  No results found for: PROT, ALBUMIN, AST,  ALT, ALKPHOS, BILITOT, BILIDIR, IBILI    Component Value Date/Time   TSH 1.060 10/15/2018 1017      I, Rebekah [name], am acting as Location manager for [employer]. Owens Shark, DO  I have reviewed the above documentation for accuracy and completeness, and I agree with the above. -Jearld Lesch, DO

## 2019-01-27 ENCOUNTER — Ambulatory Visit (INDEPENDENT_AMBULATORY_CARE_PROVIDER_SITE_OTHER): Payer: Federal, State, Local not specified - PPO | Admitting: Bariatrics

## 2019-01-27 ENCOUNTER — Other Ambulatory Visit: Payer: Self-pay

## 2019-01-27 DIAGNOSIS — Z6835 Body mass index (BMI) 35.0-35.9, adult: Secondary | ICD-10-CM

## 2019-01-27 DIAGNOSIS — E119 Type 2 diabetes mellitus without complications: Secondary | ICD-10-CM

## 2019-01-27 DIAGNOSIS — I10 Essential (primary) hypertension: Secondary | ICD-10-CM | POA: Diagnosis not present

## 2019-01-28 NOTE — Progress Notes (Signed)
Office: [phone]  /  Fax: [phone] TeleHealth Visit:  Rebekah [name] has verbally consented to this TeleHealth visit today. The patient is located at home, the provider is located at the [employer] [facility] office. The participants in this visit include the listed provider and patient and any and all parties involved. The visit was conducted today via FaceTime.  HPI:   Chief Complaint: OBESITY Rebekah [name] is here to discuss her progress with her obesity treatment plan. She is on the Category 2 plan with additional lunch option and is following her eating plan approximately 70 % of the time. She states she is walking on the treadmill 30 minutes 3 times per week. Rebekah [name] states that she has lost 3 pounds (weight 209 lbs). She struggles with new recipes for her protein. We were unable to weigh the patient today for this TeleHealth visit. She feels as if she has lost weight since her last visit. She has lost 21 lbs since starting treatment with [name].  Diabetes II Rebekah [name] has a diagnosis of diabetes type II. Rebekah [name] states fasting BGs range in the 130's and she denies any low's. Last A1c was at 7.0 Rebekah [name] is taking metformin currently. She has been working on intensive lifestyle modifications including diet, exercise, and weight loss to help control her blood glucose levels.  Hypertension Rebekah [name] is a 63 y.o. female with hypertension. She is taking Norvasc and HCTZ. Rebekah [name] denies chest pain or shortness of breath on exertion. She is working weight loss to help control her blood pressure with the goal of decreasing her risk of heart attack and stroke. Rebekah [name] blood pressure is not well controlled.  ASSESSMENT AND PLAN:  Type 2 diabetes mellitus without complication, without long-term current use of insulin (HCC)  Essential hypertension  Class 2 severe obesity with serious comorbidity and body mass index (BMI) of 35.0 to 35.9 in adult, unspecified obesity type ([place])   PLAN:  Diabetes II [name] has been given extensive diabetes education by myself today including ideal fasting and post-prandial blood glucose readings, individual ideal Hgb A1c goals and hypoglycemia prevention. We discussed the importance of good blood sugar control to decrease the likelihood of diabetic complications such as nephropathy, neuropathy, limb loss, blindness, coronary artery disease, and death. We discussed the importance of intensive lifestyle modification including diet, exercise and weight loss as the first line treatment for diabetes. [name] will continue metformin and follow up at the agreed upon time.  Hypertension We discussed sodium restriction, working on healthy weight loss, and a regular exercise program as the means to achieve improved blood pressure control. [name] agreed with this plan and agreed to follow up as directed. We will continue to monitor her blood pressure as well as her progress with the above lifestyle modifications. Rebekah [name]  will continue her medications and she will check her blood pressure in the morning.  Obesity Rebekah [name] is currently in the action stage of change. As such, her goal is to continue with weight loss efforts She has agreed to follow the Category 2 plan Rebekah [name] will continue her exercise regimen for weight loss and overall health benefits. We discussed the following Behavioral Modification Strategies today: planning for success, increase H2O intake, no skipping meals, keeping healthy foods in the home, increasing lean protein intake, decreasing simple carbohydrates, increasing vegetables, decrease eating out and work on meal planning and intentional eating  Rebekah [name] has agreed to follow up with our clinic in 2 weeks. She was informed of the  importance of frequent follow up visits to maximize her success with intensive lifestyle modifications for her multiple health conditions.  ALLERGIES: Allergies  Allergen Reactions  . Crestor [Rosuvastatin] Other  (See Comments)    myalgias    MEDICATIONS: Current Outpatient Medications on File Prior to Visit  Medication Sig Dispense Refill  . amLODipine (NORVASC) 5 MG tablet TAKE 1 TABLET BY MOUTH EVERY DAY 90 tablet 3  . aspirin 81 MG chewable tablet Chew 81 mg by mouth daily.    Marland Kitchen estradiol (ESTRACE) 1 MG tablet Take 1 mg by mouth daily.    . furosemide (LASIX) 20 MG tablet Take 20 mg by mouth.    . hydrochlorothiazide (HYDRODIURIL) 25 MG tablet Take 25 mg by mouth daily.    Marland Kitchen HYDROcodone-acetaminophen (NORCO/VICODIN) 5-325 MG tablet Take 1-2 tablets by mouth every 6 (six) hours as needed for severe pain. 10 tablet 0  . ibuprofen (ADVIL,MOTRIN) 800 MG tablet Take 1 tablet (800 mg total) by mouth 3 (three) times daily. 21 tablet 0  . leflunomide (ARAVA) 20 MG tablet Take 20 mg by mouth daily.    Marland Kitchen lubiprostone (AMITIZA) 24 MCG capsule Take 24 mcg by mouth 2 (two) times daily as needed.    . metFORMIN (GLUCOPHAGE) 500 MG tablet Take 1 tablet (500 mg total) by mouth 2 (two) times daily with a meal. 60 tablet 0  . simvastatin (ZOCOR) 40 MG tablet Take 1 tablet (40 mg total) by mouth every evening. 90 tablet 3  . Vitamin D, Ergocalciferol, (DRISDOL) 1.25 MG (50000 UT) CAPS capsule Take 1 capsule (50,000 Units total) by mouth every 7 (seven) days. 4 capsule 0   No current facility-administered medications on file prior to visit.     PAST MEDICAL HISTORY: Past Medical History:  Diagnosis Date  . Arthritis   . Diabetes mellitus type 2 in obese ([place]) 10/10/2017  . Essential hypertension 10/10/2017  . Hypertension   . Morbid obesity ([place]) 10/10/2017  . Pure hypercholesterolemia 10/10/2017  . Rheumatoid arteritis ([place])   . Vitamin D deficiency     PAST SURGICAL HISTORY: Past Surgical History:  Procedure Laterality Date  . ABDOMINAL HYSTERECTOMY    . biopsy on tongue  1976  . BREAST EXCISIONAL BIOPSY Left   . CESAREAN SECTION    . TUBAL LIGATION      SOCIAL HISTORY: Social History   Tobacco  Use  . Smoking status: Former Smoker    Quit date: 09/11/2010    Years since quitting: 8.3  . Smokeless tobacco: Never Used  Substance Use Topics  . Alcohol use: Yes    Comment: monthly   . Drug use: Not on file    FAMILY HISTORY: Family History  Problem Relation Age of Onset  . CAD Mother   . Cerebral aneurysm Father   . Stroke Sister   . CVA Sister   . Heart attack Maternal Grandmother   . Stroke Maternal Grandmother   . Alzheimer's disease Maternal Grandfather   . Cancer Paternal Grandmother     ROS: Review of Systems  Constitutional: Positive for weight loss.  Respiratory: Negative for shortness of breath (on exertion).   Cardiovascular: Negative for chest pain.  Endo/Heme/Allergies:       Negative for hypoglycemia    PHYSICAL EXAM: Pt in no acute distress  RECENT LABS AND TESTS: BMET    Component Value Date/Time   NA 138 05/28/2017 1117   K 3.7 05/28/2017 1117   CL 103 05/28/2017 1117   CO2  27 05/28/2017 1117   GLUCOSE 147 (H) 05/28/2017 1117   BUN 19 05/28/2017 1117   CREATININE 0.84 05/28/2017 1117   CALCIUM 9.2 05/28/2017 1117   GFRNONAA >60 05/28/2017 1117   GFRAA >60 05/28/2017 1117   Lab Results  Component Value Date   HGBA1C 7.0 (H) 10/15/2018   Lab Results  Component Value Date   INSULIN 10.6 10/15/2018   CBC    Component Value Date/Time   WBC 9.7 05/28/2017 1117   RBC 4.12 05/28/2017 1117   HGB 12.0 05/28/2017 1117   HCT 36.8 05/28/2017 1117   PLT 219 05/28/2017 1117   MCV 89.3 05/28/2017 1117   MCH 29.1 05/28/2017 1117   MCHC 32.6 05/28/2017 1117   RDW 13.7 05/28/2017 1117   LYMPHSABS 1.6 11/04/2016 0247   MONOABS 0.7 11/04/2016 0247   EOSABS 0.0 11/04/2016 0247   BASOSABS 0.0 11/04/2016 0247   Iron/TIBC/Ferritin/ %Sat No results found for: IRON, TIBC, FERRITIN, IRONPCTSAT Lipid Panel     Component Value Date/Time   CHOL 187 10/15/2018 1017   TRIG 75 10/15/2018 1017   HDL 67 10/15/2018 1017   LDLCALC 105 (H) 10/15/2018  1017   Hepatic Function Panel  No results found for: PROT, ALBUMIN, AST, ALT, ALKPHOS, BILITOT, BILIDIR, IBILI    Component Value Date/Time   TSH 1.060 10/15/2018 1017      I, Doreene Nest, am acting as Location manager for [employer]. Owens Shark, DO  I have reviewed the above documentation for accuracy and completeness, and I agree with the above. -Jearld Lesch, DO

## 2019-01-29 ENCOUNTER — Encounter (INDEPENDENT_AMBULATORY_CARE_PROVIDER_SITE_OTHER): Payer: Self-pay | Admitting: Bariatrics

## 2019-02-02 DIAGNOSIS — L2089 Other atopic dermatitis: Secondary | ICD-10-CM | POA: Diagnosis not present

## 2019-02-10 ENCOUNTER — Encounter (INDEPENDENT_AMBULATORY_CARE_PROVIDER_SITE_OTHER): Payer: Self-pay | Admitting: Bariatrics

## 2019-02-10 ENCOUNTER — Telehealth (INDEPENDENT_AMBULATORY_CARE_PROVIDER_SITE_OTHER): Payer: Federal, State, Local not specified - PPO | Admitting: Bariatrics

## 2019-02-10 ENCOUNTER — Other Ambulatory Visit: Payer: Self-pay

## 2019-02-10 DIAGNOSIS — E8881 Metabolic syndrome: Secondary | ICD-10-CM

## 2019-02-10 DIAGNOSIS — Z6835 Body mass index (BMI) 35.0-35.9, adult: Secondary | ICD-10-CM

## 2019-02-10 DIAGNOSIS — E119 Type 2 diabetes mellitus without complications: Secondary | ICD-10-CM

## 2019-02-10 DIAGNOSIS — E559 Vitamin D deficiency, unspecified: Secondary | ICD-10-CM | POA: Diagnosis not present

## 2019-02-10 MED ORDER — VITAMIN D (ERGOCALCIFEROL) 1.25 MG (50000 UNIT) PO CAPS: 50000.0000 [IU] | ORAL_CAPSULE | ORAL | 0 refills | Status: DC

## 2019-02-10 MED ORDER — METFORMIN HCL 500 MG PO TABS: 500.0000 mg | ORAL_TABLET | Freq: Two times a day (BID) | ORAL | 0 refills | Status: DC

## 2019-02-10 NOTE — Progress Notes (Signed)
Office: [phone]  /  Fax: [phone] TeleHealth Visit:  Rebekah [name] has verbally consented to this TeleHealth visit today. The patient is located at home, the provider is located at the [facility] office. The participants in this visit include the listed provider and patient. The visit was conducted today via FaceTime.  HPI:   Chief Complaint: OBESITY Rebekah [name] is here to discuss her progress with her obesity treatment plan. She is on the Category 2 plan and is following her eating plan approximately 50% of the time. She states she is walking on the treadmill 15-30 minutes 5 times per week. Rebekah [name] states that her weight has remained the same (209 lbs) and is down 7 lbs since her last in office visit on 11/03/2018. She states she is not struggling with the diet and reports her goal weight is 198 lbs. We were unable to weigh the patient today for this TeleHealth visit. She feels as if she has maintained her weight since her last visit. She has lost 14 lbs since starting treatment with [name].  Insulin Resistance Eren has a diagnosis of insulin resistance based on her elevated fasting insulin level >5. She thinks she's experienced "Dawn's Phenomenon" with a diagnosis of DM Type II but now insulin resistance only. Her last A1c was 7.0 on 10/15/2018 and insulin 10.6. She reports her fasting blood sugars to be 138. Although Rebekah [name]'s blood glucose readings are still under good control, insulin resistance puts her at greater risk of metabolic syndrome and diabetes. She is not taking metformin currently and continues to work on diet and exercise to decrease risk of diabetes.  Vitamin D deficiency [name] has a diagnosis of Vitamin D deficiency. She is currently taking prescription Vit D and denies nausea, vomiting or muscle weakness.  ASSESSMENT AND PLAN:  Insulin resistance  Vitamin D deficiency - Plan: Vitamin D, Ergocalciferol, (DRISDOL) 1.25 MG (50000 UT) CAPS capsule  Class 2  severe obesity with serious comorbidity and body mass index (BMI) of 35.0 to 35.9 in adult, unspecified obesity type (HCC)  Type 2 diabetes mellitus without complication, without long-term current use of insulin ([place]) - Plan: metFORMIN (GLUCOPHAGE) 500 MG tablet  PLAN:  Insulin Resistance Rebekah [name] will continue to work on weight loss, exercise, and decreasing simple carbohydrates in her diet to help decrease the risk of diabetes. We dicussed metformin including benefits and risks. She was informed that eating too many simple carbohydrates or too many calories at one sitting increases the likelihood of GI side effects. [name] was given a prescription for metformin 500 mg BID #60 with 0 refills. She agrees to follow-up with our clinic in 2 weeks.  Vitamin D Deficiency Rebekah [name] was informed that low Vitamin D levels contributes to fatigue and are associated with obesity, breast, and colon cancer. She agrees to continue to take prescription Vit D @ 50,000 IU every week #4 with 0 refills and will follow-up for routine testing of Vitamin D, at least 2-3 times per year. She was informed of the risk of over-replacement of Vitamin D and agrees to not increase her dose unless she discusses this with [name] first. [name] agrees to follow-up with our clinic in 2 weeks.  Obesity Rebekah [name] is currently in the action stage of change. As such, her goal is to continue with weight loss efforts. She has agreed to follow the Category 2 plan. Rebekah [name] will work on meal planning and intentional eating. She was advised she can eat lunch for breakfast and vice versa.  Rebekah [name] has been instructed to work up to a goal of 150 minutes of combined cardio and strengthening exercise per week for weight loss and overall health benefits. We discussed the following Behavioral Modification Strategies today: increasing lean protein intake, decreasing simple carbohydrates, increasing vegetables, increase H20 intake, decrease eating out, no skipping meals,  work on meal planning and easy cooking plans, and keeping healthy foods in the home.  Rebekah [name] has agreed to follow-up with our clinic in 2 weeks. She was informed of the importance of frequent follow-up visits to maximize her success with intensive lifestyle modifications for her multiple health conditions.  ALLERGIES: Allergies  Allergen Reactions  . Crestor [Rosuvastatin] Other (See Comments)    myalgias    MEDICATIONS: Current Outpatient Medications on File Prior to Visit  Medication Sig Dispense Refill  . amLODipine (NORVASC) 5 MG tablet TAKE 1 TABLET BY MOUTH EVERY DAY 90 tablet 3  . aspirin 81 MG chewable tablet Chew 81 mg by mouth daily.    Marland Kitchen estradiol (ESTRACE) 1 MG tablet Take 1 mg by mouth daily.    . furosemide (LASIX) 20 MG tablet Take 20 mg by mouth.    . hydrochlorothiazide (HYDRODIURIL) 25 MG tablet Take 25 mg by mouth daily.    Marland Kitchen HYDROcodone-acetaminophen (NORCO/VICODIN) 5-325 MG tablet Take 1-2 tablets by mouth every 6 (six) hours as needed for severe pain. 10 tablet 0  . ibuprofen (ADVIL,MOTRIN) 800 MG tablet Take 1 tablet (800 mg total) by mouth 3 (three) times daily. 21 tablet 0  . leflunomide (ARAVA) 20 MG tablet Take 20 mg by mouth daily.    Marland Kitchen lubiprostone (AMITIZA) 24 MCG capsule Take 24 mcg by mouth 2 (two) times daily as needed.    . simvastatin (ZOCOR) 40 MG tablet Take 1 tablet (40 mg total) by mouth every evening. 90 tablet 3   No current facility-administered medications on file prior to visit.     PAST MEDICAL HISTORY: Past Medical History:  Diagnosis Date  . Arthritis   . Diabetes mellitus type 2 in obese ([place]) 10/10/2017  . Essential hypertension 10/10/2017  . Hypertension   . Morbid obesity ([place]) 10/10/2017  . Pure hypercholesterolemia 10/10/2017  . Rheumatoid arteritis ([place])   . Vitamin D deficiency     PAST SURGICAL HISTORY: Past Surgical History:  Procedure Laterality Date  . ABDOMINAL HYSTERECTOMY    . biopsy on tongue  1976  . BREAST  EXCISIONAL BIOPSY Left   . CESAREAN SECTION    . TUBAL LIGATION      SOCIAL HISTORY: Social History   Tobacco Use  . Smoking status: Former Smoker    Quit date: 09/11/2010    Years since quitting: 8.4  . Smokeless tobacco: Never Used  Substance Use Topics  . Alcohol use: Yes    Comment: monthly   . Drug use: Not on file    FAMILY HISTORY: Family History  Problem Relation Age of Onset  . CAD Mother   . Cerebral aneurysm Father   . Stroke Sister   . CVA Sister   . Heart attack Maternal Grandmother   . Stroke Maternal Grandmother   . Alzheimer's disease Maternal Grandfather   . Cancer Paternal Grandmother    ROS: Review of Systems  Gastrointestinal: Negative for nausea and vomiting.  Musculoskeletal:       Negative for muscle weakness.   PHYSICAL EXAM: Pt in no acute distress  RECENT LABS AND TESTS: BMET    Component Value Date/Time   NA 138  05/28/2017 1117   K 3.7 05/28/2017 1117   CL 103 05/28/2017 1117   CO2 27 05/28/2017 1117   GLUCOSE 147 (H) 05/28/2017 1117   BUN 19 05/28/2017 1117   CREATININE 0.84 05/28/2017 1117   CALCIUM 9.2 05/28/2017 1117   GFRNONAA >60 05/28/2017 1117   GFRAA >60 05/28/2017 1117   Lab Results  Component Value Date   HGBA1C 7.0 (H) 10/15/2018   Lab Results  Component Value Date   INSULIN 10.6 10/15/2018   CBC    Component Value Date/Time   WBC 9.7 05/28/2017 1117   RBC 4.12 05/28/2017 1117   HGB 12.0 05/28/2017 1117   HCT 36.8 05/28/2017 1117   PLT 219 05/28/2017 1117   MCV 89.3 05/28/2017 1117   MCH 29.1 05/28/2017 1117   MCHC 32.6 05/28/2017 1117   RDW 13.7 05/28/2017 1117   LYMPHSABS 1.6 11/04/2016 0247   MONOABS 0.7 11/04/2016 0247   EOSABS 0.0 11/04/2016 0247   BASOSABS 0.0 11/04/2016 0247   Iron/TIBC/Ferritin/ %Sat No results found for: IRON, TIBC, FERRITIN, IRONPCTSAT Lipid Panel     Component Value Date/Time   CHOL 187 10/15/2018 1017   TRIG 75 10/15/2018 1017   HDL 67 10/15/2018 1017   LDLCALC  105 (H) 10/15/2018 1017   Hepatic Function Panel  No results found for: PROT, ALBUMIN, AST, ALT, ALKPHOS, BILITOT, BILIDIR, IBILI    Component Value Date/Time   TSH 1.060 10/15/2018 1017   No results found for: Vitamin D, 25-Hydroxy  I, Michaelene Song, am acting as Location manager for [employer], DO  I have reviewed the above documentation for accuracy and completeness, and I agree with the above. Jearld Lesch, DO

## 2019-02-11 ENCOUNTER — Other Ambulatory Visit (INDEPENDENT_AMBULATORY_CARE_PROVIDER_SITE_OTHER): Payer: Self-pay | Admitting: Bariatrics

## 2019-02-11 DIAGNOSIS — E559 Vitamin D deficiency, unspecified: Secondary | ICD-10-CM

## 2019-02-13 ENCOUNTER — Other Ambulatory Visit (INDEPENDENT_AMBULATORY_CARE_PROVIDER_SITE_OTHER): Payer: Self-pay | Admitting: Bariatrics

## 2019-02-13 DIAGNOSIS — E119 Type 2 diabetes mellitus without complications: Secondary | ICD-10-CM

## 2019-02-24 ENCOUNTER — Other Ambulatory Visit: Payer: Self-pay

## 2019-02-24 ENCOUNTER — Telehealth (INDEPENDENT_AMBULATORY_CARE_PROVIDER_SITE_OTHER): Payer: Federal, State, Local not specified - PPO | Admitting: Bariatrics

## 2019-02-24 DIAGNOSIS — E119 Type 2 diabetes mellitus without complications: Secondary | ICD-10-CM | POA: Diagnosis not present

## 2019-02-24 DIAGNOSIS — E559 Vitamin D deficiency, unspecified: Secondary | ICD-10-CM | POA: Diagnosis not present

## 2019-02-24 DIAGNOSIS — Z6835 Body mass index (BMI) 35.0-35.9, adult: Secondary | ICD-10-CM | POA: Diagnosis not present

## 2019-02-24 MED ORDER — METFORMIN HCL 500 MG PO TABS: 500.0000 mg | ORAL_TABLET | Freq: Two times a day (BID) | ORAL | 0 refills | Status: DC

## 2019-02-25 NOTE — Progress Notes (Signed)
Office: [phone]  /  Fax: [phone] TeleHealth Visit:  Rebekah [name] has verbally consented to this TeleHealth visit today. The patient is located at home, the provider is located at the [facility] office. The participants in this visit include the listed provider and patient. The visit was conducted today via FaceTime.  HPI:   Chief Complaint: OBESITY Rebekah [name] is here to discuss her progress with her obesity treatment plan. She is on the Category 2 plan and is following her eating plan approximately 75% of the time. She states she is walking 30 minutes 3 times per week. Rebekah [name] states that she has gained 2 lbs  (weight 212). She has been out-of-town. She reports doing well with her water and protein intake.  We were unable to weigh the patient today for this TeleHealth visit. She feels as if she has gained 2 lbs since her last visit. She has lost 14 lbs since starting treatment with [name].  Diabetes II Rebekah [name] has a diagnosis of diabetes type II. Rebekah [name] does not report checking her blood sugars. Last A1c was 7.0 on 10/15/2018 and her insulin level was 10.6. She has been working on intensive lifestyle modifications including diet, exercise, and weight loss to help control her blood glucose levels. She denies polyphagia.  Vitamin D deficiency Rebekah [name] has a diagnosis of Vitamin D deficiency. She is currently taking Vit D and denies nausea, vomiting or muscle weakness.  ASSESSMENT AND PLAN:  Vitamin D deficiency  Type 2 diabetes mellitus without complication, without long-term current use of insulin ([place]) - Plan: metFORMIN (GLUCOPHAGE) 500 MG tablet  Class 2 severe obesity with serious comorbidity and body mass index (BMI) of 35.0 to 35.9 in adult, unspecified obesity type ([place])  PLAN:  Diabetes II Rebekah [name] has been given extensive diabetes education by myself today including ideal fasting and post-prandial blood glucose readings, individual ideal HgA1c goals  and hypoglycemia  prevention. We discussed the importance of good blood sugar control to decrease the likelihood of diabetic complications such as nephropathy, neuropathy, limb loss, blindness, coronary artery disease, and death. We discussed the importance of intensive lifestyle modification including diet, exercise and weight loss as the first line treatment for diabetes. Rebekah [name] agrees to continue her diabetes medications and was given a prescription for Metformin 500 mg 1 PO BID #60 with 0 refills. She will follow-up at the agreed upon time to monitor her progress.  Vitamin D Deficiency Rebekah [name] was informed that low Vitamin D levels contributes to fatigue and are associated with obesity, breast, and colon cancer. She agrees to continue taking Vit D and will follow-up for routine testing of Vitamin D, at least 2-3 times per year. She was informed of the risk of over-replacement of Vitamin D and agrees to not increase her dose unless she discusses this with [name] first. Rebekah [name] agrees to follow-up with our clinic in 2 weeks.  Obesity Rebekah [name] is currently in the action stage of change. As such, her goal is to continue with weight loss efforts. She has agreed to follow the Category 2 plan. Rebekah [name] will work on meal planning and intentional eating. Rebekah [name] has been instructed to continue walking for weight loss and overall health benefits. We discussed the following Behavioral Modification Strategies today: increasing lean protein intake, decreasing simple carbohydrates, increasing vegetables, increase H20 intake, decrease eating out, no skipping meals, work on meal planning and easy cooking plans, and keeping healthy foods in the home.  Rebekah [name] has agreed to follow up with our clinic  in 2 weeks. She was informed of the importance of frequent follow up visits to maximize her success with intensive lifestyle modifications for her multiple health conditions.  ALLERGIES: Allergies  Allergen Reactions  . Crestor [Rosuvastatin] Other (See  Comments)    myalgias    MEDICATIONS: Current Outpatient Medications on File Prior to Visit  Medication Sig Dispense Refill  . amLODipine (NORVASC) 5 MG tablet TAKE 1 TABLET BY MOUTH EVERY DAY 90 tablet 3  . aspirin 81 MG chewable tablet Chew 81 mg by mouth daily.    Marland Kitchen estradiol (ESTRACE) 1 MG tablet Take 1 mg by mouth daily.    . furosemide (LASIX) 20 MG tablet Take 20 mg by mouth.    . hydrochlorothiazide (HYDRODIURIL) 25 MG tablet Take 25 mg by mouth daily.    Marland Kitchen HYDROcodone-acetaminophen (NORCO/VICODIN) 5-325 MG tablet Take 1-2 tablets by mouth every 6 (six) hours as needed for severe pain. 10 tablet 0  . ibuprofen (ADVIL,MOTRIN) 800 MG tablet Take 1 tablet (800 mg total) by mouth 3 (three) times daily. 21 tablet 0  . leflunomide (ARAVA) 20 MG tablet Take 20 mg by mouth daily.    Marland Kitchen lubiprostone (AMITIZA) 24 MCG capsule Take 24 mcg by mouth 2 (two) times daily as needed.    . simvastatin (ZOCOR) 40 MG tablet Take 1 tablet (40 mg total) by mouth every evening. 90 tablet 3  . Vitamin D, Ergocalciferol, (DRISDOL) 1.25 MG (50000 UT) CAPS capsule Take 1 capsule (50,000 Units total) by mouth every 7 (seven) days. 4 capsule 0   No current facility-administered medications on file prior to visit.     PAST MEDICAL HISTORY: Past Medical History:  Diagnosis Date  . Arthritis   . Diabetes mellitus type 2 in obese ([place]) 10/10/2017  . Essential hypertension 10/10/2017  . Hypertension   . Morbid obesity ([place]) 10/10/2017  . Pure hypercholesterolemia 10/10/2017  . Rheumatoid arteritis ([place])   . Vitamin D deficiency     PAST SURGICAL HISTORY: Past Surgical History:  Procedure Laterality Date  . ABDOMINAL HYSTERECTOMY    . biopsy on tongue  1976  . BREAST EXCISIONAL BIOPSY Left   . CESAREAN SECTION    . TUBAL LIGATION      SOCIAL HISTORY: Social History   Tobacco Use  . Smoking status: Former Smoker    Quit date: 09/11/2010    Years since quitting: 8.4  . Smokeless tobacco: Never Used   Substance Use Topics  . Alcohol use: Yes    Comment: monthly   . Drug use: Not on file    FAMILY HISTORY: Family History  Problem Relation Age of Onset  . CAD Mother   . Cerebral aneurysm Father   . Stroke Sister   . CVA Sister   . Heart attack Maternal Grandmother   . Stroke Maternal Grandmother   . Alzheimer's disease Maternal Grandfather   . Cancer Paternal Grandmother    ROS: Review of Systems  Gastrointestinal: Negative for nausea and vomiting.  Musculoskeletal:       Negative for muscle weakness.  Endo/Heme/Allergies:       Negative for polyphagia.   PHYSICAL EXAM: Pt in no acute distress  RECENT LABS AND TESTS: BMET    Component Value Date/Time   NA 138 05/28/2017 1117   K 3.7 05/28/2017 1117   CL 103 05/28/2017 1117   CO2 27 05/28/2017 1117   GLUCOSE 147 (H) 05/28/2017 1117   BUN 19 05/28/2017 1117   CREATININE 0.84 05/28/2017 1117  CALCIUM 9.2 05/28/2017 1117   GFRNONAA >60 05/28/2017 1117   GFRAA >60 05/28/2017 1117   Lab Results  Component Value Date   HGBA1C 7.0 (H) 10/15/2018   Lab Results  Component Value Date   INSULIN 10.6 10/15/2018   CBC    Component Value Date/Time   WBC 9.7 05/28/2017 1117   RBC 4.12 05/28/2017 1117   HGB 12.0 05/28/2017 1117   HCT 36.8 05/28/2017 1117   PLT 219 05/28/2017 1117   MCV 89.3 05/28/2017 1117   MCH 29.1 05/28/2017 1117   MCHC 32.6 05/28/2017 1117   RDW 13.7 05/28/2017 1117   LYMPHSABS 1.6 11/04/2016 0247   MONOABS 0.7 11/04/2016 0247   EOSABS 0.0 11/04/2016 0247   BASOSABS 0.0 11/04/2016 0247   Iron/TIBC/Ferritin/ %Sat No results found for: IRON, TIBC, FERRITIN, IRONPCTSAT Lipid Panel     Component Value Date/Time   CHOL 187 10/15/2018 1017   TRIG 75 10/15/2018 1017   HDL 67 10/15/2018 1017   LDLCALC 105 (H) 10/15/2018 1017   Hepatic Function Panel  No results found for: PROT, ALBUMIN, AST, ALT, ALKPHOS, BILITOT, BILIDIR, IBILI    Component Value Date/Time   TSH 1.060 10/15/2018 1017    No results found for: Vitamin D  I, Michaelene Song, am acting as Location manager for [employer], DO  I have reviewed the above documentation for accuracy and completeness, and I agree with the above. Jearld Lesch, DO

## 2019-03-02 ENCOUNTER — Other Ambulatory Visit (INDEPENDENT_AMBULATORY_CARE_PROVIDER_SITE_OTHER): Payer: Self-pay | Admitting: Bariatrics

## 2019-03-02 DIAGNOSIS — E119 Type 2 diabetes mellitus without complications: Secondary | ICD-10-CM

## 2019-03-10 ENCOUNTER — Other Ambulatory Visit: Payer: Self-pay

## 2019-03-10 ENCOUNTER — Encounter (INDEPENDENT_AMBULATORY_CARE_PROVIDER_SITE_OTHER): Payer: Self-pay | Admitting: Bariatrics

## 2019-03-10 ENCOUNTER — Telehealth (INDEPENDENT_AMBULATORY_CARE_PROVIDER_SITE_OTHER): Payer: Federal, State, Local not specified - PPO | Admitting: Bariatrics

## 2019-03-10 DIAGNOSIS — E119 Type 2 diabetes mellitus without complications: Secondary | ICD-10-CM

## 2019-03-10 DIAGNOSIS — I10 Essential (primary) hypertension: Secondary | ICD-10-CM | POA: Diagnosis not present

## 2019-03-10 DIAGNOSIS — Z6838 Body mass index (BMI) 38.0-38.9, adult: Secondary | ICD-10-CM

## 2019-03-10 MED ORDER — METFORMIN HCL 500 MG PO TABS: 500.0000 mg | ORAL_TABLET | Freq: Two times a day (BID) | ORAL | 0 refills | Status: DC

## 2019-03-11 ENCOUNTER — Other Ambulatory Visit (INDEPENDENT_AMBULATORY_CARE_PROVIDER_SITE_OTHER): Payer: Self-pay | Admitting: Bariatrics

## 2019-03-11 DIAGNOSIS — E559 Vitamin D deficiency, unspecified: Secondary | ICD-10-CM

## 2019-03-11 NOTE — Progress Notes (Signed)
Office: [phone]  /  Fax: [phone] TeleHealth Visit:  Rebekah [name] has verbally consented to this TeleHealth visit today. The patient is located at home, the provider is located at the [facility] office. The participants in this visit include the listed provider and patient and any and all parties involved. The visit was conducted today via FaceTime.  HPI:   Chief Complaint: OBESITY Rebekah [name] is here to discuss her progress with her obesity treatment plan. She is on the Category 2 plan and is following her eating plan approximately 75 % of the time. She states she is walking 5 miles 1 time per week. Rebekah [name] states that her weight remains the same. She is in self-quarantine (will ask PCP). Rebekah [name] is doing well with water and protein. We were unable to weigh the patient today for this TeleHealth visit. She feels as if she has maintained weight since her last visit. She has lost 14 lbs since starting treatment with [name].  Diabetes II Rebekah [name] has a diagnosis of diabetes type II. Last A1c was at 7.0 and last insulin level was at 10.6 She has been working on intensive lifestyle modifications including diet, exercise, and weight loss to help control her blood glucose levels. Rebekah [name] denies polyphagia.  Hypertension Rebekah [name] is a 63 y.o. female with hypertension. Rebekah [name] denies lightheadedness. She is working weight loss to help control her blood pressure with the goal of decreasing her risk of heart attack and stroke. [name] blood pressure is reasonably well controlled.  ASSESSMENT AND PLAN:  Essential hypertension  Type 2 diabetes mellitus without complication, without long-term current use of insulin ([place]) - Plan: metFORMIN (GLUCOPHAGE) 500 MG tablet  Class 2 severe obesity with serious comorbidity and body mass index (BMI) of 38.0 to 38.9 in adult, unspecified obesity type ([place])  PLAN:  Diabetes II Rebekah [name] has been given extensive diabetes education by  myself today including ideal fasting and post-prandial blood glucose readings, individual ideal Hgb A1c goals and hypoglycemia prevention. We discussed the importance of good blood sugar control to decrease the likelihood of diabetic complications such as nephropathy, neuropathy, limb loss, blindness, coronary artery disease, and death. We discussed the importance of intensive lifestyle modification including diet, exercise and weight loss as the first line treatment for diabetes. Rebekah [name] agrees to continue metformin 500 mg two times daily with a meal #60 with no refills and follow up at the agreed upon time.  Hypertension We discussed sodium restriction, working on healthy weight loss, and a regular exercise program as the means to achieve improved blood pressure control. Rebekah [name] agreed with this plan and agreed to follow up as directed. We will continue to monitor her blood pressure as well as her progress with the above lifestyle modifications. She will continue her medications as prescribed and will watch for signs of hypotension as she continues her lifestyle modifications.  Obesity Rebekah [name] is currently in the action stage of change. As such, her goal is to continue with weight loss efforts She has agreed to follow the Category 2 plan Rebekah [name] has been instructed to work up to a goal of 150 minutes of combined cardio and strengthening exercise per week or doing exercises (walking) for weight loss and overall health benefits. We discussed the following Behavioral Modification Strategies today: increase H2O intake, no skipping meals, keeping healthy foods in the home, increasing lean protein intake, decreasing simple carbohydrates, increasing vegetables, decrease eating out and work on meal planning and intentional eating  Rebekah [name]  has agreed to follow up with our clinic in 2 weeks. She was informed of the importance of frequent follow up visits to maximize her success with intensive lifestyle modifications for  her multiple health conditions.  ALLERGIES: Allergies  Allergen Reactions  . Crestor [Rosuvastatin] Other (See Comments)    myalgias    MEDICATIONS: Current Outpatient Medications on File Prior to Visit  Medication Sig Dispense Refill  . amLODipine (NORVASC) 5 MG tablet TAKE 1 TABLET BY MOUTH EVERY DAY 90 tablet 3  . aspirin 81 MG chewable tablet Chew 81 mg by mouth daily.    Marland Kitchen estradiol (ESTRACE) 1 MG tablet Take 1 mg by mouth daily.    . furosemide (LASIX) 20 MG tablet Take 20 mg by mouth.    . hydrochlorothiazide (HYDRODIURIL) 25 MG tablet Take 25 mg by mouth daily.    Marland Kitchen HYDROcodone-acetaminophen (NORCO/VICODIN) 5-325 MG tablet Take 1-2 tablets by mouth every 6 (six) hours as needed for severe pain. 10 tablet 0  . ibuprofen (ADVIL,MOTRIN) 800 MG tablet Take 1 tablet (800 mg total) by mouth 3 (three) times daily. 21 tablet 0  . leflunomide (ARAVA) 20 MG tablet Take 20 mg by mouth daily.    Marland Kitchen lubiprostone (AMITIZA) 24 MCG capsule Take 24 mcg by mouth 2 (two) times daily as needed.    . simvastatin (ZOCOR) 40 MG tablet Take 1 tablet (40 mg total) by mouth every evening. 90 tablet 3  . Vitamin D, Ergocalciferol, (DRISDOL) 1.25 MG (50000 UT) CAPS capsule Take 1 capsule (50,000 Units total) by mouth every 7 (seven) days. 4 capsule 0   No current facility-administered medications on file prior to visit.     PAST MEDICAL HISTORY: Past Medical History:  Diagnosis Date  . Arthritis   . Diabetes mellitus type 2 in obese ([place]) 10/10/2017  . Essential hypertension 10/10/2017  . Hypertension   . Morbid obesity ([place]) 10/10/2017  . Pure hypercholesterolemia 10/10/2017  . Rheumatoid arteritis ([place])   . Vitamin D deficiency     PAST SURGICAL HISTORY: Past Surgical History:  Procedure Laterality Date  . ABDOMINAL HYSTERECTOMY    . biopsy on tongue  1976  . BREAST EXCISIONAL BIOPSY Left   . CESAREAN SECTION    . TUBAL LIGATION      SOCIAL HISTORY: Social History   Tobacco Use  .  Smoking status: Former Smoker    Quit date: 09/11/2010    Years since quitting: 8.5  . Smokeless tobacco: Never Used  Substance Use Topics  . Alcohol use: Yes    Comment: monthly   . Drug use: Not on file    FAMILY HISTORY: Family History  Problem Relation Age of Onset  . CAD Mother   . Cerebral aneurysm Father   . Stroke Sister   . CVA Sister   . Heart attack Maternal Grandmother   . Stroke Maternal Grandmother   . Alzheimer's disease Maternal Grandfather   . Cancer Paternal Grandmother     ROS: Review of Systems  Constitutional: Negative for weight loss.  Neurological:       Negative for lightheadedness  Endo/Heme/Allergies:       Negative for polyphagia    PHYSICAL EXAM: Pt in no acute distress  RECENT LABS AND TESTS: BMET    Component Value Date/Time   NA 138 05/28/2017 1117   K 3.7 05/28/2017 1117   CL 103 05/28/2017 1117   CO2 27 05/28/2017 1117   GLUCOSE 147 (H) 05/28/2017 1117   BUN 19  05/28/2017 1117   CREATININE 0.84 05/28/2017 1117   CALCIUM 9.2 05/28/2017 1117   GFRNONAA >60 05/28/2017 1117   GFRAA >60 05/28/2017 1117   Lab Results  Component Value Date   HGBA1C 7.0 (H) 10/15/2018   Lab Results  Component Value Date   INSULIN 10.6 10/15/2018   CBC    Component Value Date/Time   WBC 9.7 05/28/2017 1117   RBC 4.12 05/28/2017 1117   HGB 12.0 05/28/2017 1117   HCT 36.8 05/28/2017 1117   PLT 219 05/28/2017 1117   MCV 89.3 05/28/2017 1117   MCH 29.1 05/28/2017 1117   MCHC 32.6 05/28/2017 1117   RDW 13.7 05/28/2017 1117   LYMPHSABS 1.6 11/04/2016 0247   MONOABS 0.7 11/04/2016 0247   EOSABS 0.0 11/04/2016 0247   BASOSABS 0.0 11/04/2016 0247   Iron/TIBC/Ferritin/ %Sat No results found for: IRON, TIBC, FERRITIN, IRONPCTSAT Lipid Panel     Component Value Date/Time   CHOL 187 10/15/2018 1017   TRIG 75 10/15/2018 1017   HDL 67 10/15/2018 1017   LDLCALC 105 (H) 10/15/2018 1017   Hepatic Function Panel  No results found for: PROT,  ALBUMIN, AST, ALT, ALKPHOS, BILITOT, BILIDIR, IBILI    Component Value Date/Time   TSH 1.060 10/15/2018 1017      I, Doreene Nest, am acting as Location manager for [employer]. Owens Shark, DO  I have reviewed the above documentation for accuracy and completeness, and I agree with the above. -Jearld Lesch, DO

## 2019-03-24 ENCOUNTER — Encounter (INDEPENDENT_AMBULATORY_CARE_PROVIDER_SITE_OTHER): Payer: Self-pay | Admitting: Bariatrics

## 2019-03-24 ENCOUNTER — Other Ambulatory Visit: Payer: Self-pay

## 2019-03-24 ENCOUNTER — Telehealth (INDEPENDENT_AMBULATORY_CARE_PROVIDER_SITE_OTHER): Payer: Federal, State, Local not specified - PPO | Admitting: Bariatrics

## 2019-03-24 DIAGNOSIS — E1169 Type 2 diabetes mellitus with other specified complication: Secondary | ICD-10-CM | POA: Diagnosis not present

## 2019-03-24 DIAGNOSIS — E669 Obesity, unspecified: Secondary | ICD-10-CM

## 2019-03-24 DIAGNOSIS — E559 Vitamin D deficiency, unspecified: Secondary | ICD-10-CM | POA: Diagnosis not present

## 2019-03-24 DIAGNOSIS — I10 Essential (primary) hypertension: Secondary | ICD-10-CM | POA: Diagnosis not present

## 2019-03-24 DIAGNOSIS — Z6835 Body mass index (BMI) 35.0-35.9, adult: Secondary | ICD-10-CM

## 2019-03-24 MED ORDER — VITAMIN D (ERGOCALCIFEROL) 1.25 MG (50000 UNIT) PO CAPS: 50000.0000 [IU] | ORAL_CAPSULE | ORAL | 0 refills | Status: DC

## 2019-03-25 ENCOUNTER — Telehealth: Payer: Federal, State, Local not specified - PPO | Admitting: Family

## 2019-03-25 ENCOUNTER — Other Ambulatory Visit: Payer: Self-pay

## 2019-03-25 DIAGNOSIS — Z20822 Contact with and (suspected) exposure to covid-19: Secondary | ICD-10-CM

## 2019-03-25 MED ORDER — BENZONATATE 100 MG PO CAPS: 100.0000 mg | ORAL_CAPSULE | Freq: Three times a day (TID) | ORAL | 0 refills | Status: DC | PRN

## 2019-03-25 NOTE — Progress Notes (Signed)
E-Visit for Corona Virus Screening   Your current symptoms could be consistent with the coronavirus.  Many health care providers can now test patients at their office but not all are.  [facility] has multiple testing sites. For information on our COVID testing locations and hours go to [email or web address]  Please quarantine yourself while awaiting your test results.  We are enrolling you in our MyChart Home Montioring for COVID19 . Daily you will receive a questionnaire within the MyChart website. Our COVID 19 response team willl be monitoriing your responses daily.  You can go to one of the  testing sites listed below, while they are opened (see hours). You do not need an order and will stay in your car during the test. You do need to self isolate until your results return and if positive 14 days from when your symptoms started and until you are 3 days symptom free.   Testing Locations (Monday - Friday, 8 a.m. - 3:30 p.m.) . [place]: [facility], [address], [place], [place]  . [place]: [facility], [address], [place], [place] (entrance off [address])  . [place]: [address], [place], [place] (across from [facility])  Approximately 5 minutes was spent documenting and reviewing patient's chart.    COVID-19 is a respiratory illness with symptoms that are similar to the flu. Symptoms are typically mild to moderate, but there have been cases of severe illness and death due to the virus. The following symptoms may appear 2-14 days after exposure: . Fever . Cough . Shortness of breath or difficulty breathing . Chills . Repeated shaking with chills . Muscle pain . Headache . Sore throat . New loss of taste or smell . Fatigue . Congestion or runny nose . Nausea or vomiting . Diarrhea  It is vitally important that if you feel that you have an infection such  as this virus or any other virus that you stay home and away from places where you may spread it to others.  You should self-quarantine for 14 days if you have symptoms that could potentially be coronavirus or have been in close contact a with a person diagnosed with COVID-19 within the last 2 weeks. You should avoid contact with people age 65 and older.   You should wear a mask or cloth face covering over your nose and mouth if you must be around other people or animals, including pets (even at home). Try to stay at least 6 feet away from other people. This will protect the people around you.  You can use medication such as A prescription cough medication called Tessalon Perles 100 mg. You may take 1-2 capsules every 8 hours as needed for cough  You may also take acetaminophen (Tylenol) as needed for fever.   Reduce your risk of any infection by using the same precautions used for avoiding the common cold or flu:  . Wash your hands often with soap and warm water for at least 20 seconds.  If soap and water are not readily available, use an alcohol-based hand sanitizer with at least 60% alcohol.  . If coughing or sneezing, cover your mouth and nose by coughing or sneezing into the elbow areas of your shirt or coat, into a tissue or into your sleeve (not your hands). . Avoid shaking hands with others and consider head nods or verbal greetings only. . Avoid touching your eyes, nose, or mouth with   unwashed hands.  . Avoid close contact with people who are sick. . Avoid places or events with large numbers of people in one location, like concerts or sporting events. . Carefully consider travel plans you have or are making. . If you are planning any travel outside or inside the [place], visit the CDC's Travelers' Health webpage for the latest health notices. . If you have some symptoms but not all symptoms, continue to monitor at home and seek medical attention if your symptoms worsen. . If you are having a  medical emergency, call 911.  HOME CARE . Only take medications as instructed by your medical team. . Drink plenty of fluids and get plenty of rest. . A steam or ultrasonic humidifier can help if you have congestion.   GET HELP RIGHT AWAY IF YOU HAVE EMERGENCY WARNING SIGNS** FOR COVID-19. If you or someone is showing any of these signs seek emergency medical care immediately. Call 911 or proceed to your closest emergency facility if: . You develop worsening high fever. . Trouble breathing . Bluish lips or face . Persistent pain or pressure in the chest . New confusion . Inability to wake or stay awake . You cough up blood. . Your symptoms become more severe  **This list is not all possible symptoms. Contact your medical provider for any symptoms that are sever or concerning to you.   MAKE SURE YOU   Understand these instructions.  Will watch your condition.  Will get help right away if you are not doing well or get worse.  Your e-visit answers were reviewed by a board certified advanced clinical practitioner to complete your personal care plan.  Depending on the condition, your plan could have included both over the counter or prescription medications.  If there is a problem please reply once you have received a response from your provider.  Your safety is important to us.  If you have drug allergies check your prescription carefully.    You can use MyChart to ask questions about today's visit, request a non-urgent call back, or ask for a work or school excuse for 24 hours related to this e-Visit. If it has been greater than 24 hours you will need to follow up with your provider, or enter a new e-Visit to address those concerns. You will get an e-mail in the next two days asking about your experience.  I hope that your e-visit has been valuable and will speed your recovery. Thank you for using e-visits.    

## 2019-03-26 NOTE — Progress Notes (Signed)
Office: [phone]  /  Fax: [phone] TeleHealth Visit:  Rebekah [name] has verbally consented to this TeleHealth visit today. The patient is located at home, the provider is located at the [facility] office. The participants in this visit include the listed provider and patient and any and all parties involved. The visit was conducted today via FaceTime.  HPI:   Chief Complaint: OBESITY Rebekah [name] is here to discuss her progress with her obesity treatment plan. She is on the Category 2 plan and is following her eating plan approximately 50 % of the time. She states she is walking 60 minutes 3 times per week. Rebekah [name] states that her weight is the same (weight 209 lbs-date not reported). She had to go to [place] last week. Rebekah [name] was able to stick to her diet. She is doing well with her water. We were unable to weigh the patient today for this TeleHealth visit. She feels as if she has maintained weight since her last visit. She has lost 14 lbs since starting treatment with [name].  Diabetes II Rebekah [name] has a diagnosis of diabetes type II. Rebekah [name] is taking metformin. Rebekah [name] denies any hypoglycemic episodes. Last A1c was at 7.0 and last insulin level was at 10.6 She has been working on intensive lifestyle modifications including diet, exercise, and weight loss to help control her blood glucose levels.  Hypertension Rebekah [name] is a 63 y.o. female with hypertension. Rebekah [name] blood pressure is reasonably well controlled, but is slightly elevated today at 153/85 Rebekah [name] denies chest pain or shortness of breath on exertion. She is working weight loss to help control her blood pressure with the goal of decreasing her risk of heart attack and stroke.   Vitamin D deficiency Rebekah [name] has a diagnosis of vitamin D deficiency. She is currently taking vit D and denies nausea, vomiting or muscle weakness.  ASSESSMENT AND PLAN:  Diabetes mellitus type 2 in obese ([facility])  Vitamin D  deficiency - Plan: Vitamin D, Ergocalciferol, (DRISDOL) 1.25 MG (50000 UT) CAPS capsule  Class 2 severe obesity with serious comorbidity and body mass index (BMI) of 35.0 to 35.9 in adult, unspecified obesity type (Rebekah [name])  PLAN:  Diabetes II Rebekah [name] has been given extensive diabetes education by myself today including ideal fasting and post-prandial blood glucose readings, individual ideal Hgb A1c goals and hypoglycemia prevention. We discussed the importance of good blood sugar control to decrease the likelihood of diabetic complications such as nephropathy, neuropathy, limb loss, blindness, coronary artery disease, and death. We discussed the importance of intensive lifestyle modification including diet, exercise and weight loss as the first line treatment for diabetes. Rebekah [name] agrees to continue her diabetes medications and will follow up at the agreed upon time.  Hypertension We discussed sodium restriction (no added salt), working on healthy weight loss, and a regular exercise program as the means to achieve improved blood pressure control. Rebekah [name] agreed with this plan and agreed to follow up as directed. We will continue to monitor her blood pressure as well as her progress with the above lifestyle modifications. She will continue her medications as prescribed and will watch for signs of hypotension as she continues her lifestyle modifications.  Vitamin D Deficiency Rebekah [name] was informed that low vitamin D levels contributes to fatigue and are associated with obesity, breast, and colon cancer. She agrees to continue to take prescription Vit D @50 ,000 IU every week #4 with no refills and will follow up for routine testing of vitamin D,  at least 2-3 times per year. She was informed of the risk of over-replacement of vitamin D and agrees to not increase her dose unless she discusses this with [name] first. Rebekah [name] agrees to follow up as directed.  Obesity Rebekah [name] is currently in the action stage of change. As  such, her goal is to continue with weight loss efforts She has agreed to follow the Category 2 plan Rebekah [name] has been instructed to work up to a goal of 150 minutes of combined cardio and strengthening exercise per week for weight loss and overall health benefits. We discussed the following Behavioral Modification Strategies today: increase H2O intake, no skipping meals, keeping healthy foods in the home, increasing lean protein intake, decreasing simple carbohydrates, increasing vegetables, decrease eating out and work on meal planning and intentional eating  Rebekah [name] has agreed to follow up with our clinic in 2 weeks. She was informed of the importance of frequent follow up visits to maximize her success with intensive lifestyle modifications for her multiple health conditions.  ALLERGIES: Allergies  Allergen Reactions  . Crestor [Rosuvastatin] Other (See Comments)    myalgias    MEDICATIONS: Current Outpatient Medications on File Prior to Visit  Medication Sig Dispense Refill  . amLODipine (NORVASC) 5 MG tablet TAKE 1 TABLET BY MOUTH EVERY DAY 90 tablet 3  . aspirin 81 MG chewable tablet Chew 81 mg by mouth daily.    Marland Kitchen estradiol (ESTRACE) 1 MG tablet Take 1 mg by mouth daily.    . furosemide (LASIX) 20 MG tablet Take 20 mg by mouth.    . hydrochlorothiazide (HYDRODIURIL) 25 MG tablet Take 25 mg by mouth daily.    Marland Kitchen HYDROcodone-acetaminophen (NORCO/VICODIN) 5-325 MG tablet Take 1-2 tablets by mouth every 6 (six) hours as needed for severe pain. 10 tablet 0  . ibuprofen (ADVIL,MOTRIN) 800 MG tablet Take 1 tablet (800 mg total) by mouth 3 (three) times daily. 21 tablet 0  . leflunomide (ARAVA) 20 MG tablet Take 20 mg by mouth daily.    Marland Kitchen lubiprostone (AMITIZA) 24 MCG capsule Take 24 mcg by mouth 2 (two) times daily as needed.    . metFORMIN (GLUCOPHAGE) 500 MG tablet Take 1 tablet (500 mg total) by mouth 2 (two) times daily with a meal. 60 tablet 0  . simvastatin (ZOCOR) 40 MG tablet Take 1  tablet (40 mg total) by mouth every evening. 90 tablet 3   No current facility-administered medications on file prior to visit.     PAST MEDICAL HISTORY: Past Medical History:  Diagnosis Date  . Arthritis   . Diabetes mellitus type 2 in obese ([place]) 10/10/2017  . Essential hypertension 10/10/2017  . Hypertension   . Morbid obesity ([place]) 10/10/2017  . Pure hypercholesterolemia 10/10/2017  . Rheumatoid arteritis ([place])   . Vitamin D deficiency     PAST SURGICAL HISTORY: Past Surgical History:  Procedure Laterality Date  . ABDOMINAL HYSTERECTOMY    . biopsy on tongue  1976  . BREAST EXCISIONAL BIOPSY Left   . CESAREAN SECTION    . TUBAL LIGATION      SOCIAL HISTORY: Social History   Tobacco Use  . Smoking status: Former Smoker    Quit date: 09/11/2010    Years since quitting: 8.5  . Smokeless tobacco: Never Used  Substance Use Topics  . Alcohol use: Yes    Comment: monthly   . Drug use: Not on file    FAMILY HISTORY: Family History  Problem Relation Age of Onset  .  CAD Mother   . Cerebral aneurysm Father   . Stroke Sister   . CVA Sister   . Heart attack Maternal Grandmother   . Stroke Maternal Grandmother   . Alzheimer's disease Maternal Grandfather   . Cancer Paternal Grandmother     ROS: Review of Systems  Constitutional: Negative for weight loss.  Respiratory: Negative for shortness of breath (on exertion).   Cardiovascular: Negative for chest pain.  Gastrointestinal: Negative for nausea and vomiting.  Musculoskeletal:       Negative for muscle weakness  Endo/Heme/Allergies:       Negative for hypoglycemia    PHYSICAL EXAM: Pt in no acute distress  RECENT LABS AND TESTS: BMET    Component Value Date/Time   NA 138 05/28/2017 1117   K 3.7 05/28/2017 1117   CL 103 05/28/2017 1117   CO2 27 05/28/2017 1117   GLUCOSE 147 (H) 05/28/2017 1117   BUN 19 05/28/2017 1117   CREATININE 0.84 05/28/2017 1117   CALCIUM 9.2 05/28/2017 1117   GFRNONAA >60  05/28/2017 1117   GFRAA >60 05/28/2017 1117   Lab Results  Component Value Date   HGBA1C 7.0 (H) 10/15/2018   Lab Results  Component Value Date   INSULIN 10.6 10/15/2018   CBC    Component Value Date/Time   WBC 9.7 05/28/2017 1117   RBC 4.12 05/28/2017 1117   HGB 12.0 05/28/2017 1117   HCT 36.8 05/28/2017 1117   PLT 219 05/28/2017 1117   MCV 89.3 05/28/2017 1117   MCH 29.1 05/28/2017 1117   MCHC 32.6 05/28/2017 1117   RDW 13.7 05/28/2017 1117   LYMPHSABS 1.6 11/04/2016 0247   MONOABS 0.7 11/04/2016 0247   EOSABS 0.0 11/04/2016 0247   BASOSABS 0.0 11/04/2016 0247   Iron/TIBC/Ferritin/ %Sat No results found for: IRON, TIBC, FERRITIN, IRONPCTSAT Lipid Panel     Component Value Date/Time   CHOL 187 10/15/2018 1017   TRIG 75 10/15/2018 1017   HDL 67 10/15/2018 1017   LDLCALC 105 (H) 10/15/2018 1017   Hepatic Function Panel  No results found for: PROT, ALBUMIN, AST, ALT, ALKPHOS, BILITOT, BILIDIR, IBILI    Component Value Date/Time   TSH 1.060 10/15/2018 1017      I, Doreene Nest, am acting as Location manager for [employer]. Owens Shark, DO   I have reviewed the above documentation for accuracy and completeness, and I agree with the above. -Jearld Lesch, DO

## 2019-03-27 LAB — NOVEL CORONAVIRUS, NAA: SARS-CoV-2, NAA: NOT DETECTED

## 2019-03-31 DIAGNOSIS — E119 Type 2 diabetes mellitus without complications: Secondary | ICD-10-CM | POA: Diagnosis not present

## 2019-03-31 DIAGNOSIS — Z0000 Encounter for general adult medical examination without abnormal findings: Secondary | ICD-10-CM | POA: Diagnosis not present

## 2019-03-31 DIAGNOSIS — E559 Vitamin D deficiency, unspecified: Secondary | ICD-10-CM | POA: Diagnosis not present

## 2019-03-31 DIAGNOSIS — I10 Essential (primary) hypertension: Secondary | ICD-10-CM | POA: Diagnosis not present

## 2019-03-31 DIAGNOSIS — Z79899 Other long term (current) drug therapy: Secondary | ICD-10-CM | POA: Diagnosis not present

## 2019-03-31 DIAGNOSIS — Z23 Encounter for immunization: Secondary | ICD-10-CM | POA: Diagnosis not present

## 2019-04-01 ENCOUNTER — Encounter (INDEPENDENT_AMBULATORY_CARE_PROVIDER_SITE_OTHER): Payer: Self-pay

## 2019-04-02 ENCOUNTER — Encounter (INDEPENDENT_AMBULATORY_CARE_PROVIDER_SITE_OTHER): Payer: Self-pay

## 2019-04-03 ENCOUNTER — Encounter (INDEPENDENT_AMBULATORY_CARE_PROVIDER_SITE_OTHER): Payer: Self-pay

## 2019-04-05 ENCOUNTER — Encounter (INDEPENDENT_AMBULATORY_CARE_PROVIDER_SITE_OTHER): Payer: Self-pay

## 2019-04-06 ENCOUNTER — Encounter (INDEPENDENT_AMBULATORY_CARE_PROVIDER_SITE_OTHER): Payer: Self-pay

## 2019-04-06 ENCOUNTER — Other Ambulatory Visit (INDEPENDENT_AMBULATORY_CARE_PROVIDER_SITE_OTHER): Payer: Self-pay | Admitting: Bariatrics

## 2019-04-06 DIAGNOSIS — E119 Type 2 diabetes mellitus without complications: Secondary | ICD-10-CM

## 2019-04-07 ENCOUNTER — Other Ambulatory Visit: Payer: Self-pay

## 2019-04-07 ENCOUNTER — Encounter (INDEPENDENT_AMBULATORY_CARE_PROVIDER_SITE_OTHER): Payer: Self-pay

## 2019-04-07 ENCOUNTER — Encounter (INDEPENDENT_AMBULATORY_CARE_PROVIDER_SITE_OTHER): Payer: Self-pay | Admitting: Bariatrics

## 2019-04-07 ENCOUNTER — Telehealth (INDEPENDENT_AMBULATORY_CARE_PROVIDER_SITE_OTHER): Payer: Federal, State, Local not specified - PPO | Admitting: Bariatrics

## 2019-04-07 DIAGNOSIS — E559 Vitamin D deficiency, unspecified: Secondary | ICD-10-CM | POA: Diagnosis not present

## 2019-04-07 DIAGNOSIS — I10 Essential (primary) hypertension: Secondary | ICD-10-CM

## 2019-04-07 DIAGNOSIS — Z6835 Body mass index (BMI) 35.0-35.9, adult: Secondary | ICD-10-CM | POA: Diagnosis not present

## 2019-04-07 MED ORDER — VITAMIN D (ERGOCALCIFEROL) 1.25 MG (50000 UNIT) PO CAPS: 50000.0000 [IU] | ORAL_CAPSULE | ORAL | 0 refills | Status: DC

## 2019-04-09 NOTE — Progress Notes (Signed)
Office: [phone]  /  Fax: [phone] TeleHealth Visit:  Rebekah [name] has verbally consented to this TeleHealth visit today. The patient is located at home, the provider is located at the [facility] office. The participants in this visit include the listed provider and patient and any and all parties involved. The visit was conducted today via FaceTime.  HPI:   Chief Complaint: OBESITY Rebekah [name] is here to discuss her progress with her obesity treatment plan. She is on the Category 2 plan and is following her eating plan approximately 50 % of the time. She states she is walking 10 to 15 miles 1 time per week. Rebekah [name] states that she has lost 3 pounds (weight 204 lbs 04/07/19). She states that her life is doing well. Rebekah [name] is doing well with her water. We were unable to weigh the patient today for this TeleHealth visit. She feels as if she has lost weight since her last visit. She has lost 14 lbs since starting treatment with [name].  Vitamin D deficiency Rebekah [name] has [name] diagnosis of vitamin D deficiency. She is currently taking vit D and denies nausea, vomiting or muscle weakness.  Hypertension Rebekah [name] is [name] 63 y.o. female with hypertension. She is taking Norvasc and HCTZ. Rebekah [name] denies chest pain or shortness of breath on exertion. She is working weight loss to help control her blood pressure with the goal of decreasing her risk of heart attack and stroke. Rebekah [name] blood pressure is well controlled.  ASSESSMENT AND PLAN:  Essential hypertension  Vitamin D deficiency - Plan: Vitamin D, Ergocalciferol, (DRISDOL) 1.25 MG (50000 UT) CAPS capsule  Class 2 severe obesity with serious comorbidity and body mass index (BMI) of 35.0 to 35.9 in adult, unspecified obesity type ([place])  PLAN:  Vitamin D Deficiency Rebekah [name] was informed that low vitamin D levels contributes to fatigue and are associated with obesity, breast, and colon cancer. She agrees to continue to take  prescription Vit D @50 ,000 IU every week #4 with no refills and will follow up for routine testing of vitamin D, at least 2-3 times per year. She was informed of the risk of over-replacement of vitamin D and agrees to not increase her dose unless she discusses this with [name] first. Rebekah [name] agrees to follow up with our clinic in 3 to 4 weeks.  Hypertension We discussed sodium restriction, working on healthy weight loss, and [name] regular exercise program as the means to achieve improved blood pressure control. Rebekah [name] agreed with this plan and agreed to follow up as directed. We will continue to monitor her blood pressure as well as her progress with the above lifestyle modifications. She will continue her medications as prescribed and will watch for signs of hypotension as she continues her lifestyle modifications.  Obesity Rebekah [name] is currently in the action stage of change. As such, her goal is to continue with weight loss efforts She has agreed to follow the Category 2 plan Rebekah [name] will continue walking and dancing for weight loss and overall health benefits. We discussed the following Behavioral Modification Strategies today: planning for success, increase H2O intake, no skipping meals, keeping healthy foods in the home, increasing lean protein intake, decreasing simple carbohydrates, increasing vegetables, decrease eating out and work on meal planning and intentional eating Rebekah [name] had labs 03/31/2019: Hgb A1c 5.9, lipid panel was normal, vitamin D 25.0, TSH was normal, CMP was normal, CBC was normal, and microalbumin was normal  Rebekah [name] has agreed to follow up with  our clinic in 3 to 4 weeks. She was informed of the importance of frequent follow up visits to maximize her success with intensive lifestyle modifications for her multiple health conditions.  ALLERGIES: Allergies  Allergen Reactions  . Crestor [Rosuvastatin] Other (See Comments)    myalgias    MEDICATIONS: Current Outpatient Medications on [name]  Prior to Visit  Medication Sig Dispense Refill  . amLODipine (NORVASC) 5 MG tablet TAKE 1 TABLET BY MOUTH EVERY DAY 90 tablet 3  . aspirin 81 MG chewable tablet Chew 81 mg by mouth daily.    . benzonatate (TESSALON PERLES) 100 MG capsule Take 1 capsule (100 mg total) by mouth 3 (three) times daily as needed. 20 capsule 0  . estradiol (ESTRACE) 1 MG tablet Take 1 mg by mouth daily.    . furosemide (LASIX) 20 MG tablet Take 20 mg by mouth.    . hydrochlorothiazide (HYDRODIURIL) 25 MG tablet Take 25 mg by mouth daily.    Marland Kitchen HYDROcodone-acetaminophen (NORCO/VICODIN) 5-325 MG tablet Take 1-2 tablets by mouth every 6 (six) hours as needed for severe pain. 10 tablet 0  . ibuprofen (ADVIL,MOTRIN) 800 MG tablet Take 1 tablet (800 mg total) by mouth 3 (three) times daily. 21 tablet 0  . leflunomide (ARAVA) 20 MG tablet Take 20 mg by mouth daily.    Marland Kitchen lubiprostone (AMITIZA) 24 MCG capsule Take 24 mcg by mouth 2 (two) times daily as needed.    . metFORMIN (GLUCOPHAGE) 500 MG tablet Take 1 tablet (500 mg total) by mouth 2 (two) times daily with [name] meal. 60 tablet 0  . simvastatin (ZOCOR) 40 MG tablet Take 1 tablet (40 mg total) by mouth every evening. 90 tablet 3   No current facility-administered medications on [name] prior to visit.     PAST MEDICAL HISTORY: Past Medical History:  Diagnosis Date  . Arthritis   . Diabetes mellitus type 2 in obese ([place]) 10/10/2017  . Essential hypertension 10/10/2017  . Hypertension   . Morbid obesity ([place]) 10/10/2017  . Pure hypercholesterolemia 10/10/2017  . Rheumatoid arteritis ([place])   . Vitamin D deficiency     PAST SURGICAL HISTORY: Past Surgical History:  Procedure Laterality Date  . ABDOMINAL HYSTERECTOMY    . biopsy on tongue  1976  . BREAST EXCISIONAL BIOPSY Left   . CESAREAN SECTION    . TUBAL LIGATION      SOCIAL HISTORY: Social History   Tobacco Use  . Smoking status: Former Smoker    Quit date: 09/11/2010    Years since quitting: 8.5  .  Smokeless tobacco: Never Used  Substance Use Topics  . Alcohol use: Yes    Comment: monthly   . Drug use: Not on [name]    FAMILY HISTORY: Family History  Problem Relation Age of Onset  . CAD Mother   . Cerebral aneurysm Father   . Stroke Sister   . CVA Sister   . Heart attack Maternal Grandmother   . Stroke Maternal Grandmother   . Alzheimer's disease Maternal Grandfather   . Cancer Paternal Grandmother     ROS: Review of Systems  Constitutional: Positive for weight loss.  Respiratory: Negative for shortness of breath (on exertion).   Cardiovascular: Negative for chest pain.  Gastrointestinal: Negative for nausea and vomiting.  Musculoskeletal:       Negative for muscle weakness    PHYSICAL EXAM: Pt in no acute distress  RECENT LABS AND TESTS: BMET    Component Value Date/Time   NA  138 05/28/2017 1117   K 3.7 05/28/2017 1117   CL 103 05/28/2017 1117   CO2 27 05/28/2017 1117   GLUCOSE 147 (H) 05/28/2017 1117   BUN 19 05/28/2017 1117   CREATININE 0.84 05/28/2017 1117   CALCIUM 9.2 05/28/2017 1117   GFRNONAA >60 05/28/2017 1117   GFRAA >60 05/28/2017 1117   Lab Results  Component Value Date   HGBA1C 7.0 (H) 10/15/2018   Lab Results  Component Value Date   INSULIN 10.6 10/15/2018   CBC    Component Value Date/Time   WBC 9.7 05/28/2017 1117   RBC 4.12 05/28/2017 1117   HGB 12.0 05/28/2017 1117   HCT 36.8 05/28/2017 1117   PLT 219 05/28/2017 1117   MCV 89.3 05/28/2017 1117   MCH 29.1 05/28/2017 1117   MCHC 32.6 05/28/2017 1117   RDW 13.7 05/28/2017 1117   LYMPHSABS 1.6 11/04/2016 0247   MONOABS 0.7 11/04/2016 0247   EOSABS 0.0 11/04/2016 0247   BASOSABS 0.0 11/04/2016 0247   Iron/TIBC/Ferritin/ %Sat No results found for: IRON, TIBC, FERRITIN, IRONPCTSAT Lipid Panel     Component Value Date/Time   CHOL 187 10/15/2018 1017   TRIG 75 10/15/2018 1017   HDL 67 10/15/2018 1017   LDLCALC 105 (H) 10/15/2018 1017   Hepatic Function Panel  No results  found for: PROT, ALBUMIN, AST, ALT, ALKPHOS, BILITOT, BILIDIR, IBILI    Component Value Date/Time   TSH 1.060 10/15/2018 1017      I, Doreene Nest, am acting as Location manager for [employer]. Owens Shark, DO  I have reviewed the above documentation for accuracy and completeness, and I agree with the above. -Jearld Lesch, DO

## 2019-04-22 ENCOUNTER — Other Ambulatory Visit (INDEPENDENT_AMBULATORY_CARE_PROVIDER_SITE_OTHER): Payer: Self-pay | Admitting: Bariatrics

## 2019-04-22 DIAGNOSIS — E119 Type 2 diabetes mellitus without complications: Secondary | ICD-10-CM

## 2019-04-27 ENCOUNTER — Other Ambulatory Visit (INDEPENDENT_AMBULATORY_CARE_PROVIDER_SITE_OTHER): Payer: Self-pay | Admitting: Bariatrics

## 2019-04-27 DIAGNOSIS — E119 Type 2 diabetes mellitus without complications: Secondary | ICD-10-CM

## 2019-04-27 DIAGNOSIS — L309 Dermatitis, unspecified: Secondary | ICD-10-CM | POA: Diagnosis not present

## 2019-04-28 ENCOUNTER — Other Ambulatory Visit: Payer: Self-pay

## 2019-04-28 ENCOUNTER — Encounter (INDEPENDENT_AMBULATORY_CARE_PROVIDER_SITE_OTHER): Payer: Self-pay | Admitting: Bariatrics

## 2019-04-28 ENCOUNTER — Telehealth (INDEPENDENT_AMBULATORY_CARE_PROVIDER_SITE_OTHER): Payer: Federal, State, Local not specified - PPO | Admitting: Bariatrics

## 2019-04-28 DIAGNOSIS — E669 Obesity, unspecified: Secondary | ICD-10-CM

## 2019-04-28 DIAGNOSIS — E559 Vitamin D deficiency, unspecified: Secondary | ICD-10-CM | POA: Diagnosis not present

## 2019-04-28 DIAGNOSIS — E1169 Type 2 diabetes mellitus with other specified complication: Secondary | ICD-10-CM | POA: Diagnosis not present

## 2019-04-28 DIAGNOSIS — Z6835 Body mass index (BMI) 35.0-35.9, adult: Secondary | ICD-10-CM

## 2019-04-28 MED ORDER — METFORMIN HCL 500 MG PO TABS: 500.0000 mg | ORAL_TABLET | Freq: Two times a day (BID) | ORAL | 0 refills | Status: DC

## 2019-04-28 MED ORDER — VITAMIN D (ERGOCALCIFEROL) 1.25 MG (50000 UNIT) PO CAPS: 50000.0000 [IU] | ORAL_CAPSULE | ORAL | 0 refills | Status: DC

## 2019-04-30 NOTE — Progress Notes (Signed)
Office: [phone]  /  Fax: [phone] TeleHealth Visit:  Rebekah [name] has verbally consented to this TeleHealth visit today. The patient is located at home, the provider is located at the [facility] office. The participants in this visit include the listed provider and patient and any and all parties involved. The visit was conducted today via FaceTime.  HPI:   Chief Complaint: OBESITY Rebekah [name] is here to discuss her progress with her obesity treatment plan. She is on the Category 2 plan and is following her eating plan approximately 50 % of the time. She states she is walking 30 minutes 2 times per week. Rebekah [name] states that she has gained 1 pound (weight 205 lbs, date not reported). She went to the beach last week. She is doing well with protein and water. We were unable to weigh the patient today for this TeleHealth visit. She feels as if she has gained weight since her last visit. She has lost 14 lbs since starting treatment with [name].  Vitamin D deficiency Rebekah [name] has a diagnosis of vitamin D deficiency. She is currently taking vit D and denies nausea, vomiting or muscle weakness.  Diabetes II Rebekah [name] has a diagnosis of diabetes type II. Rebekah [name] states blood sugar is 134 and she had one low blood sugar. She has been working on intensive lifestyle modifications including diet, exercise, and weight loss to help control her blood glucose levels. Rebekah [name] denies polyphagia.  ASSESSMENT AND PLAN:  Vitamin D deficiency - Plan: Vitamin D, Ergocalciferol, (DRISDOL) 1.25 MG (50000 UT) CAPS capsule  Diabetes mellitus type 2 in obese (Rebekah [name]) - Plan: metFORMIN (GLUCOPHAGE) 500 MG tablet  Class 2 severe obesity with serious comorbidity and body mass index (BMI) of 35.0 to 35.9 in adult, unspecified obesity type ([place])  PLAN:  Vitamin D Deficiency Rebekah [name] was informed that low vitamin D levels contributes to fatigue and are associated with obesity, breast, and colon cancer. Rebekah [name] agrees to  continue to take prescription Vit D @50 ,000 IU every week #4 with no refills and she will follow up for routine testing of vitamin D, at least 2-3 times per year. She was informed of the risk of over-replacement of vitamin D and agrees to not increase her dose unless she discusses this with [name] first. Rebekah [name] agrees to follow up with our clinic in 2 weeks.  Diabetes II Rebekah [name] has been given extensive diabetes education by myself today including ideal fasting and post-prandial blood glucose readings, individual ideal Hgb A1c goals and hypoglycemia prevention. We discussed the importance of good blood sugar control to decrease the likelihood of diabetic complications such as nephropathy, neuropathy, limb loss, blindness, coronary artery disease, and death. We discussed the importance of intensive lifestyle modification including diet, exercise and weight loss as the first line treatment for diabetes. Rebekah [name] agrees to continue metformin 500 mg two times daily with meals #60 with no refills and follow up at the agreed upon time.  Obesity Rebekah [name] is currently in the action stage of change. As such, her goal is to continue with weight loss efforts She has agreed to follow the Category 2 plan Rebekah [name] will continue to walk and she will increase exercise for weight loss and overall health benefits. We discussed the following Behavioral Modification Strategies today: planning for success, increase H2O intake, no skipping meals, keeping healthy foods in the home, increasing lean protein intake, decreasing simple carbohydrates , increasing vegetables, decrease eating out, work on meal planning and intentional eating, dealing with family or  coworker sabotage, travel eating strategies and celebration eating strategies Rebekah [name] will continue to use Fit[employer].  Rebekah [name] has agreed to follow up with our clinic in 2 weeks. She was informed of the importance of frequent follow up visits to maximize her success with intensive lifestyle  modifications for her multiple health conditions.  ALLERGIES: Allergies  Allergen Reactions  . Crestor [Rosuvastatin] Other (See Comments)    myalgias    MEDICATIONS: Current Outpatient Medications on File Prior to Visit  Medication Sig Dispense Refill  . amLODipine (NORVASC) 5 MG tablet TAKE 1 TABLET BY MOUTH EVERY DAY 90 tablet 3  . aspirin 81 MG chewable tablet Chew 81 mg by mouth daily.    . benzonatate (TESSALON PERLES) 100 MG capsule Take 1 capsule (100 mg total) by mouth 3 (three) times daily as needed. 20 capsule 0  . estradiol (ESTRACE) 1 MG tablet Take 1 mg by mouth daily.    . furosemide (LASIX) 20 MG tablet Take 20 mg by mouth.    . hydrochlorothiazide (HYDRODIURIL) 25 MG tablet Take 25 mg by mouth daily.    Marland Kitchen HYDROcodone-acetaminophen (NORCO/VICODIN) 5-325 MG tablet Take 1-2 tablets by mouth every 6 (six) hours as needed for severe pain. 10 tablet 0  . ibuprofen (ADVIL,MOTRIN) 800 MG tablet Take 1 tablet (800 mg total) by mouth 3 (three) times daily. 21 tablet 0  . leflunomide (ARAVA) 20 MG tablet Take 20 mg by mouth daily.    Marland Kitchen lubiprostone (AMITIZA) 24 MCG capsule Take 24 mcg by mouth 2 (two) times daily as needed.    . simvastatin (ZOCOR) 40 MG tablet Take 1 tablet (40 mg total) by mouth every evening. 90 tablet 3   No current facility-administered medications on file prior to visit.     PAST MEDICAL HISTORY: Past Medical History:  Diagnosis Date  . Arthritis   . Diabetes mellitus type 2 in obese ([place]) 10/10/2017  . Essential hypertension 10/10/2017  . Hypertension   . Morbid obesity ([place]) 10/10/2017  . Pure hypercholesterolemia 10/10/2017  . Rheumatoid arteritis ([place])   . Vitamin D deficiency     PAST SURGICAL HISTORY: Past Surgical History:  Procedure Laterality Date  . ABDOMINAL HYSTERECTOMY    . biopsy on tongue  1976  . BREAST EXCISIONAL BIOPSY Left   . CESAREAN SECTION    . TUBAL LIGATION      SOCIAL HISTORY: Social History   Tobacco Use  .  Smoking status: Former Smoker    Quit date: 09/11/2010    Years since quitting: 8.6  . Smokeless tobacco: Never Used  Substance Use Topics  . Alcohol use: Yes    Comment: monthly   . Drug use: Not on file    FAMILY HISTORY: Family History  Problem Relation Age of Onset  . CAD Mother   . Cerebral aneurysm Father   . Stroke Sister   . CVA Sister   . Heart attack Maternal Grandmother   . Stroke Maternal Grandmother   . Alzheimer's disease Maternal Grandfather   . Cancer Paternal Grandmother     ROS: Review of Systems  Constitutional: Negative for weight loss.  Gastrointestinal: Negative for nausea and vomiting.  Musculoskeletal:       Negative for muscle weakness  Endo/Heme/Allergies:       Positive for hypoglycemia Negative for polyphagia    PHYSICAL EXAM: Pt in no acute distress  RECENT LABS AND TESTS: BMET    Component Value Date/Time   NA 138 05/28/2017 1117   K  3.7 05/28/2017 1117   CL 103 05/28/2017 1117   CO2 27 05/28/2017 1117   GLUCOSE 147 (H) 05/28/2017 1117   BUN 19 05/28/2017 1117   CREATININE 0.84 05/28/2017 1117   CALCIUM 9.2 05/28/2017 1117   GFRNONAA >60 05/28/2017 1117   GFRAA >60 05/28/2017 1117   Lab Results  Component Value Date   HGBA1C 7.0 (H) 10/15/2018   Lab Results  Component Value Date   INSULIN 10.6 10/15/2018   CBC    Component Value Date/Time   WBC 9.7 05/28/2017 1117   RBC 4.12 05/28/2017 1117   HGB 12.0 05/28/2017 1117   HCT 36.8 05/28/2017 1117   PLT 219 05/28/2017 1117   MCV 89.3 05/28/2017 1117   MCH 29.1 05/28/2017 1117   MCHC 32.6 05/28/2017 1117   RDW 13.7 05/28/2017 1117   LYMPHSABS 1.6 11/04/2016 0247   MONOABS 0.7 11/04/2016 0247   EOSABS 0.0 11/04/2016 0247   BASOSABS 0.0 11/04/2016 0247   Iron/TIBC/Ferritin/ %Sat No results found for: IRON, TIBC, FERRITIN, IRONPCTSAT Lipid Panel     Component Value Date/Time   CHOL 187 10/15/2018 1017   TRIG 75 10/15/2018 1017   HDL 67 10/15/2018 1017   LDLCALC  105 (H) 10/15/2018 1017   Hepatic Function Panel  No results found for: PROT, ALBUMIN, AST, ALT, ALKPHOS, BILITOT, BILIDIR, IBILI    Component Value Date/Time   TSH 1.060 10/15/2018 1017      I, Doreene Nest, am acting as Location manager for [employer]. Owens Shark, DO  I have reviewed the above documentation for accuracy and completeness, and I agree with the above. -Jearld Lesch, DO

## 2019-05-01 DIAGNOSIS — E559 Vitamin D deficiency, unspecified: Secondary | ICD-10-CM | POA: Diagnosis not present

## 2019-05-01 DIAGNOSIS — M069 Rheumatoid arthritis, unspecified: Secondary | ICD-10-CM | POA: Diagnosis not present

## 2019-05-01 DIAGNOSIS — Z87891 Personal history of nicotine dependence: Secondary | ICD-10-CM | POA: Diagnosis not present

## 2019-05-04 ENCOUNTER — Other Ambulatory Visit (INDEPENDENT_AMBULATORY_CARE_PROVIDER_SITE_OTHER): Payer: Self-pay | Admitting: Bariatrics

## 2019-05-04 DIAGNOSIS — E559 Vitamin D deficiency, unspecified: Secondary | ICD-10-CM

## 2019-05-12 ENCOUNTER — Encounter (INDEPENDENT_AMBULATORY_CARE_PROVIDER_SITE_OTHER): Payer: Self-pay | Admitting: Bariatrics

## 2019-05-12 ENCOUNTER — Telehealth (INDEPENDENT_AMBULATORY_CARE_PROVIDER_SITE_OTHER): Payer: Federal, State, Local not specified - PPO | Admitting: Bariatrics

## 2019-05-12 ENCOUNTER — Other Ambulatory Visit: Payer: Self-pay

## 2019-05-12 DIAGNOSIS — E559 Vitamin D deficiency, unspecified: Secondary | ICD-10-CM | POA: Diagnosis not present

## 2019-05-12 DIAGNOSIS — E119 Type 2 diabetes mellitus without complications: Secondary | ICD-10-CM

## 2019-05-12 DIAGNOSIS — Z6835 Body mass index (BMI) 35.0-35.9, adult: Secondary | ICD-10-CM

## 2019-05-12 DIAGNOSIS — K5904 Chronic idiopathic constipation: Secondary | ICD-10-CM | POA: Diagnosis not present

## 2019-05-12 MED ORDER — METFORMIN HCL 500 MG PO TABS: 500.0000 mg | ORAL_TABLET | Freq: Two times a day (BID) | ORAL | 0 refills | Status: DC

## 2019-05-12 MED ORDER — LUBIPROSTONE 24 MCG PO CAPS: 24.0000 ug | ORAL_CAPSULE | Freq: Two times a day (BID) | ORAL | 0 refills | Status: DC | PRN

## 2019-05-12 MED ORDER — VITAMIN D (ERGOCALCIFEROL) 1.25 MG (50000 UNIT) PO CAPS: 50000.0000 [IU] | ORAL_CAPSULE | ORAL | 0 refills | Status: DC

## 2019-05-12 NOTE — Progress Notes (Signed)
Office: [phone]  /  Fax: [phone] TeleHealth Visit:  Natalia Leatherwood Couey has verbally consented to this TeleHealth visit today. The patient is located at home, the provider is located at the [facility] office. The participants in this visit include the listed provider and patient. The visit was conducted today via FaceTime.  HPI:   Chief Complaint: OBESITY Liat is here to discuss her progress with her obesity treatment plan. She is on the Category 2 plan and is following her eating plan approximately 30% of the time. She states she is walking 7-10 miles per week.  Desirie states that her weight remains the same (weight 205). She has been very stressed but reports she is feeling good. We were unable to weigh the patient today for this TeleHealth visit. She feels as if she has maintained her weight since her last visit. She has lost 14 lbs since starting treatment with [name].  Diabetes II Latonja has a diagnosis of diabetes type II. Margarita does not report checking her blood sugars. Last A1c was 7.0 on 10/15/2018. She has been working on intensive lifestyle modifications including diet, exercise, and weight loss to help control her blood glucose levels. No polyphagia.  Vitamin D deficiency Erie has a diagnosis of Vitamin D deficiency. She is currently taking prescription Vit D and denies nausea, vomiting or muscle weakness.  Constipation Azyria notes constipation and notes medication has been helpful.  ASSESSMENT AND PLAN:  Vitamin D deficiency - Plan: Vitamin D, Ergocalciferol, (DRISDOL) 1.25 MG (50000 UT) CAPS capsule  Chronic idiopathic constipation - Plan: lubiprostone (AMITIZA) 24 MCG capsule  Type 2 diabetes mellitus without complication, without long-term current use of insulin (HCC) - Plan: metFORMIN (GLUCOPHAGE) 500 MG tablet  Class 2 severe obesity with serious comorbidity and body mass index (BMI) of 35.0 to 35.9 in adult, unspecified obesity type ([place])   PLAN:  Diabetes II Lovada has been given extensive diabetes education by myself today including ideal fasting and post-prandial blood glucose readings, individual ideal HgA1c goals  and hypoglycemia prevention. We discussed the importance of good blood sugar control to decrease the likelihood of diabetic complications such as nephropathy, neuropathy, limb loss, blindness, coronary artery disease, and death. We discussed the importance of intensive lifestyle modification including diet, exercise and weight loss as the first line treatment for diabetes. Josselyn was given a prescription for metformin 500 mg 1 BID #60 with 0 refills. She agrees to follow-up with our clinic in 2 weeks.  Vitamin D Deficiency Akua was informed that low Vitamin D levels contributes to fatigue and are associated with obesity, breast, and colon cancer. She agrees to continue to take prescription Vit D @ 50,000 IU every week #4 with 0 refills and will follow-up for routine testing of Vitamin D, at least 2-3 times per year. She was informed of the risk of over-replacement of Vitamin D and agrees to not increase her dose unless she discusses this with [name] first. Reed agrees to follow-up with our clinic in 2 weeks.  Constipation Tarica was given a prescription for Amitiza 24 mcg 1 capsule BID PRN #60 with 0 refills. She agrees to follow-up with our clinic in 2 weeks.  Obesity Shaindy is currently in the action stage of change. As such, her goal is to continue with weight loss efforts. She has agreed to follow the Category 2 plan. Isata will work on meal planning, intentional eating, and increasing proterin. Abryana has been instructed to continue to exercise for weight loss  and overall health benefits. We discussed the following Behavioral Modification Strategies today: increasing lean protein intake, decreasing simple carbohydrates, increasing vegetables, increase H20 intake, decrease eating out, no skipping meals, work on meal  planning and easy cooking plans, and keeping healthy foods in the home.  Lyly has agreed to follow-up with our clinic in 2 weeks. She was informed of the importance of frequent follow-up visits to maximize her success with intensive lifestyle modifications for her multiple health conditions.  ALLERGIES: Allergies  Allergen Reactions  . Crestor [Rosuvastatin] Other (See Comments)    myalgias    MEDICATIONS: Current Outpatient Medications on File Prior to Visit  Medication Sig Dispense Refill  . amLODipine (NORVASC) 5 MG tablet TAKE 1 TABLET BY MOUTH EVERY DAY 90 tablet 3  . aspirin 81 MG chewable tablet Chew 81 mg by mouth daily.    . benzonatate (TESSALON PERLES) 100 MG capsule Take 1 capsule (100 mg total) by mouth 3 (three) times daily as needed. 20 capsule 0  . estradiol (ESTRACE) 1 MG tablet Take 1 mg by mouth daily.    . furosemide (LASIX) 20 MG tablet Take 20 mg by mouth.    . hydrochlorothiazide (HYDRODIURIL) 25 MG tablet Take 25 mg by mouth daily.    Marland Kitchen HYDROcodone-acetaminophen (NORCO/VICODIN) 5-325 MG tablet Take 1-2 tablets by mouth every 6 (six) hours as needed for severe pain. 10 tablet 0  . ibuprofen (ADVIL,MOTRIN) 800 MG tablet Take 1 tablet (800 mg total) by mouth 3 (three) times daily. 21 tablet 0  . leflunomide (ARAVA) 20 MG tablet Take 20 mg by mouth daily.    . simvastatin (ZOCOR) 40 MG tablet Take 1 tablet (40 mg total) by mouth every evening. 90 tablet 3   No current facility-administered medications on file prior to visit.     PAST MEDICAL HISTORY: Past Medical History:  Diagnosis Date  . Arthritis   . Diabetes mellitus type 2 in obese ([place]) 10/10/2017  . Essential hypertension 10/10/2017  . Hypertension   . Morbid obesity ([place]) 10/10/2017  . Pure hypercholesterolemia 10/10/2017  . Rheumatoid arteritis ([place])   . Vitamin D deficiency     PAST SURGICAL HISTORY: Past Surgical History:  Procedure Laterality Date  . ABDOMINAL HYSTERECTOMY    . biopsy on  tongue  1976  . BREAST EXCISIONAL BIOPSY Left   . CESAREAN SECTION    . TUBAL LIGATION      SOCIAL HISTORY: Social History   Tobacco Use  . Smoking status: Former Smoker    Quit date: 09/11/2010    Years since quitting: 8.6  . Smokeless tobacco: Never Used  Substance Use Topics  . Alcohol use: Yes    Comment: monthly   . Drug use: Not on file    FAMILY HISTORY: Family History  Problem Relation Age of Onset  . CAD Mother   . Cerebral aneurysm Father   . Stroke Sister   . CVA Sister   . Heart attack Maternal Grandmother   . Stroke Maternal Grandmother   . Alzheimer's disease Maternal Grandfather   . Cancer Paternal Grandmother    ROS: Review of Systems  Gastrointestinal: Positive for constipation. Negative for nausea and vomiting.  Musculoskeletal:       Negative for muscle weakness.  Endo/Heme/Allergies:       Negative for polyphagia.   PHYSICAL EXAM: Pt in no acute distress  RECENT LABS AND TESTS: BMET    Component Value Date/Time   NA 138 05/28/2017 1117   K  3.7 05/28/2017 1117   CL 103 05/28/2017 1117   CO2 27 05/28/2017 1117   GLUCOSE 147 (H) 05/28/2017 1117   BUN 19 05/28/2017 1117   CREATININE 0.84 05/28/2017 1117   CALCIUM 9.2 05/28/2017 1117   GFRNONAA >60 05/28/2017 1117   GFRAA >60 05/28/2017 1117   Lab Results  Component Value Date   HGBA1C 7.0 (H) 10/15/2018   Lab Results  Component Value Date   INSULIN 10.6 10/15/2018   CBC    Component Value Date/Time   WBC 9.7 05/28/2017 1117   RBC 4.12 05/28/2017 1117   HGB 12.0 05/28/2017 1117   HCT 36.8 05/28/2017 1117   PLT 219 05/28/2017 1117   MCV 89.3 05/28/2017 1117   MCH 29.1 05/28/2017 1117   MCHC 32.6 05/28/2017 1117   RDW 13.7 05/28/2017 1117   LYMPHSABS 1.6 11/04/2016 0247   MONOABS 0.7 11/04/2016 0247   EOSABS 0.0 11/04/2016 0247   BASOSABS 0.0 11/04/2016 0247   Iron/TIBC/Ferritin/ %Sat No results found for: IRON, TIBC, FERRITIN, IRONPCTSAT Lipid Panel     Component  Value Date/Time   CHOL 187 10/15/2018 1017   TRIG 75 10/15/2018 1017   HDL 67 10/15/2018 1017   LDLCALC 105 (H) 10/15/2018 1017   Hepatic Function Panel  No results found for: PROT, ALBUMIN, AST, ALT, ALKPHOS, BILITOT, BILIDIR, IBILI    Component Value Date/Time   TSH 1.060 10/15/2018 1017   No results found for: Vitamin D, 25-Hydroxy  I, Michaelene Song, am acting as Location manager for [employer], DO  I have reviewed the above documentation for accuracy and completeness, and I agree with the above. Jearld Lesch, DO

## 2019-05-24 ENCOUNTER — Other Ambulatory Visit (INDEPENDENT_AMBULATORY_CARE_PROVIDER_SITE_OTHER): Payer: Self-pay | Admitting: Bariatrics

## 2019-05-24 DIAGNOSIS — E119 Type 2 diabetes mellitus without complications: Secondary | ICD-10-CM

## 2019-05-26 ENCOUNTER — Other Ambulatory Visit: Payer: Self-pay

## 2019-05-26 ENCOUNTER — Ambulatory Visit (INDEPENDENT_AMBULATORY_CARE_PROVIDER_SITE_OTHER): Payer: Federal, State, Local not specified - PPO | Admitting: Bariatrics

## 2019-05-26 ENCOUNTER — Encounter (INDEPENDENT_AMBULATORY_CARE_PROVIDER_SITE_OTHER): Payer: Self-pay | Admitting: Bariatrics

## 2019-05-26 DIAGNOSIS — M25561 Pain in right knee: Secondary | ICD-10-CM | POA: Diagnosis not present

## 2019-05-26 DIAGNOSIS — E119 Type 2 diabetes mellitus without complications: Secondary | ICD-10-CM

## 2019-05-26 DIAGNOSIS — Z6835 Body mass index (BMI) 35.0-35.9, adult: Secondary | ICD-10-CM

## 2019-05-26 DIAGNOSIS — E559 Vitamin D deficiency, unspecified: Secondary | ICD-10-CM | POA: Diagnosis not present

## 2019-05-26 DIAGNOSIS — G8929 Other chronic pain: Secondary | ICD-10-CM

## 2019-05-26 MED ORDER — METFORMIN HCL 500 MG PO TABS: 500.0000 mg | ORAL_TABLET | Freq: Two times a day (BID) | ORAL | 0 refills | Status: DC

## 2019-05-26 MED ORDER — VITAMIN D (ERGOCALCIFEROL) 1.25 MG (50000 UNIT) PO CAPS: 50000.0000 [IU] | ORAL_CAPSULE | ORAL | 0 refills | Status: DC

## 2019-05-26 MED ORDER — IBUPROFEN 800 MG PO TABS: 800.0000 mg | ORAL_TABLET | Freq: Three times a day (TID) | ORAL | 0 refills | Status: DC

## 2019-05-27 ENCOUNTER — Encounter (INDEPENDENT_AMBULATORY_CARE_PROVIDER_SITE_OTHER): Payer: Self-pay | Admitting: Bariatrics

## 2019-05-27 ENCOUNTER — Other Ambulatory Visit (INDEPENDENT_AMBULATORY_CARE_PROVIDER_SITE_OTHER): Payer: Self-pay

## 2019-05-27 MED ORDER — IBUPROFEN 800 MG PO TABS: 800.0000 mg | ORAL_TABLET | Freq: Three times a day (TID) | ORAL | 0 refills | Status: DC

## 2019-05-27 MED ORDER — METFORMIN HCL 500 MG PO TABS: 500.0000 mg | ORAL_TABLET | Freq: Two times a day (BID) | ORAL | 0 refills | Status: DC

## 2019-05-27 MED ORDER — VITAMIN D (ERGOCALCIFEROL) 1.25 MG (50000 UNIT) PO CAPS: 50000.0000 [IU] | ORAL_CAPSULE | ORAL | 0 refills | Status: DC

## 2019-05-28 NOTE — Progress Notes (Signed)
Office: [phone]  /  Fax: [phone] TeleHealth Visit:  Rebekah [name] has verbally consented to this TeleHealth visit today. The patient is located at home, the provider is located at the [facility] office. The participants in this visit include the listed provider and patient and any and all parties involved. The visit was conducted today via FaceTime.  HPI:   Chief Complaint: OBESITY Retina is here to discuss her progress with her obesity treatment plan. She is on the Category 2 plan and is following her eating plan approximately 60 % of the time. She states she is walking 2 miles 7 days per week. [name] states that her weight remains the same. She is doing well with water. We were unable to weigh the patient today for this TeleHealth visit. She feels as if she has maintained weight since her last visit (weight 210 lbs, date not reported). She has lost 14 lbs since starting treatment with [name].  Vitamin D deficiency Rebekah [name] has a diagnosis of vitamin D deficiency. Rebekah [name] is currently taking vit D and she denies nausea, vomiting or muscle weakness.  Knee Pain   Diabetes II Rebekah [name] has a diagnosis of diabetes type II. Rebekah [name] has been working on intensive lifestyle modifications including diet, exercise, and weight loss to help control her blood glucose levels. Rebekah [name] denies polyphagia.  ASSESSMENT AND PLAN:  Vitamin D deficiency - Plan: Vitamin D, Ergocalciferol, (DRISDOL) 1.25 MG (50000 UT) CAPS capsule, DISCONTINUED: Vitamin D, Ergocalciferol, (DRISDOL) 1.25 MG (50000 UT) CAPS capsule  Type 2 diabetes mellitus without complication, without long-term current use of insulin (HCC) - Plan: metFORMIN (GLUCOPHAGE) 500 MG tablet, DISCONTINUED: metFORMIN (GLUCOPHAGE) 500 MG tablet  Chronic pain of right knee  Class 2 severe obesity with serious comorbidity and body mass index (BMI) of 35.0 to 35.9 in adult, unspecified obesity type ([place])  PLAN:  Vitamin D Deficiency  [name] was informed that low vitamin D levels contributes to fatigue and are associated with obesity, breast, and colon cancer. [name] agrees to continue to take prescription Vit D @50 ,000 IU every week #4 with no refills and she will follow up for routine testing of vitamin D, at least 2-3 times per year. She was informed of the risk of over-replacement of vitamin D and agrees to not increase her dose unless she discusses this with [name] first. [name] agrees to follow up with our clinic in 2 weeks.  Knee Pain [name] agrees to take Advil 800 mg 3 times daily as needed #21 and increase her water intake. [name] agrees to follow up with our clinic in 2 weeks.  Diabetes II [name] has been given extensive diabetes education by myself today including ideal fasting and post-prandial blood glucose readings, individual ideal Hgb A1c goals and hypoglycemia prevention. We discussed the importance of good blood sugar control to decrease the likelihood of diabetic complications such as nephropathy, neuropathy, limb loss, blindness, coronary artery disease, and death. We discussed the importance of intensive lifestyle modification including diet, exercise and weight loss as the first line treatment for diabetes. Rebekah [name] agrees to continue metformin 500 mg two times daily with a meal #60 with no refills and follow up at the agreed upon time.  Obesity Rebekah [name] is currently in the action stage of change. As such, her goal is to continue with weight loss efforts She has agreed to follow the Category 2 plan Rebekah [name] will continue walking for weight loss and overall health benefits, and she will do no pounding exercise. We  discussed the following Behavioral Modification Strategies today: increase H2O intake, no skipping meals, keeping healthy foods in the home, increasing lean protein intake, decreasing simple carbohydrates, increasing vegetables and work on meal planning and easy cooking plans  Rebekah [name] has agreed to follow up with our  clinic in 2 weeks. She was informed of the importance of frequent follow up visits to maximize her success with intensive lifestyle modifications for her multiple health conditions.  ALLERGIES: Allergies  Allergen Reactions  . Crestor [Rosuvastatin] Other (See Comments)    myalgias    MEDICATIONS: Current Outpatient Medications on File Prior to Visit  Medication Sig Dispense Refill  . amLODipine (NORVASC) 5 MG tablet TAKE 1 TABLET BY MOUTH EVERY DAY 90 tablet 3  . aspirin 81 MG chewable tablet Chew 81 mg by mouth daily.    . benzonatate (TESSALON PERLES) 100 MG capsule Take 1 capsule (100 mg total) by mouth 3 (three) times daily as needed. 20 capsule 0  . estradiol (ESTRACE) 1 MG tablet Take 1 mg by mouth daily.    . furosemide (LASIX) 20 MG tablet Take 20 mg by mouth.    . hydrochlorothiazide (HYDRODIURIL) 25 MG tablet Take 25 mg by mouth daily.    Rebekah [name] HYDROcodone-acetaminophen (NORCO/VICODIN) 5-325 MG tablet Take 1-2 tablets by mouth every 6 (six) hours as needed for severe pain. 10 tablet 0  . leflunomide (ARAVA) 20 MG tablet Take 20 mg by mouth daily.    Rebekah [name] lubiprostone (AMITIZA) 24 MCG capsule Take 1 capsule (24 mcg total) by mouth 2 (two) times daily as needed. 60 capsule 0  . simvastatin (ZOCOR) 40 MG tablet Take 1 tablet (40 mg total) by mouth every evening. 90 tablet 3   No current facility-administered medications on file prior to visit.     PAST MEDICAL HISTORY: Past Medical History:  Diagnosis Date  . Arthritis   . Diabetes mellitus type 2 in obese (Rebekah [name]) 10/10/2017  . Essential hypertension 10/10/2017  . Hypertension   . Morbid obesity ([place]) 10/10/2017  . Pure hypercholesterolemia 10/10/2017  . Rheumatoid arteritis ([place])   . Vitamin D deficiency     PAST SURGICAL HISTORY: Past Surgical History:  Procedure Laterality Date  . ABDOMINAL HYSTERECTOMY    . biopsy on tongue  1976  . BREAST EXCISIONAL BIOPSY Left   . CESAREAN SECTION    . TUBAL LIGATION      SOCIAL  HISTORY: Social History   Tobacco Use  . Smoking status: Former Smoker    Quit date: 09/11/2010    Years since quitting: 8.7  . Smokeless tobacco: Never Used  Substance Use Topics  . Alcohol use: Yes    Comment: monthly   . Drug use: Not on file    FAMILY HISTORY: Family History  Problem Relation Age of Onset  . CAD Mother   . Cerebral aneurysm Father   . Stroke Sister   . CVA Sister   . Heart attack Maternal Grandmother   . Stroke Maternal Grandmother   . Alzheimer's disease Maternal Grandfather   . Cancer Paternal Grandmother     ROS: Review of Systems  Constitutional: Negative for weight loss.  Gastrointestinal: Negative for nausea and vomiting.  Musculoskeletal:       Positive for knee pain  Endo/Heme/Allergies:       Negative for polyphagia    PHYSICAL EXAM: Pt in no acute distress  RECENT LABS AND TESTS: BMET    Component Value Date/Time   NA 138 05/28/2017 1117  K 3.7 05/28/2017 1117   CL 103 05/28/2017 1117   CO2 27 05/28/2017 1117   GLUCOSE 147 (H) 05/28/2017 1117   BUN 19 05/28/2017 1117   CREATININE 0.84 05/28/2017 1117   CALCIUM 9.2 05/28/2017 1117   GFRNONAA >60 05/28/2017 1117   GFRAA >60 05/28/2017 1117   Lab Results  Component Value Date   HGBA1C 7.0 (H) 10/15/2018   Lab Results  Component Value Date   INSULIN 10.6 10/15/2018   CBC    Component Value Date/Time   WBC 9.7 05/28/2017 1117   RBC 4.12 05/28/2017 1117   HGB 12.0 05/28/2017 1117   HCT 36.8 05/28/2017 1117   PLT 219 05/28/2017 1117   MCV 89.3 05/28/2017 1117   MCH 29.1 05/28/2017 1117   MCHC 32.6 05/28/2017 1117   RDW 13.7 05/28/2017 1117   LYMPHSABS 1.6 11/04/2016 0247   MONOABS 0.7 11/04/2016 0247   EOSABS 0.0 11/04/2016 0247   BASOSABS 0.0 11/04/2016 0247   Iron/TIBC/Ferritin/ %Sat No results found for: IRON, TIBC, FERRITIN, IRONPCTSAT Lipid Panel     Component Value Date/Time   CHOL 187 10/15/2018 1017   TRIG 75 10/15/2018 1017   HDL 67 10/15/2018 1017    LDLCALC 105 (H) 10/15/2018 1017   Hepatic Function Panel  No results found for: PROT, ALBUMIN, AST, ALT, ALKPHOS, BILITOT, BILIDIR, IBILI    Component Value Date/Time   TSH 1.060 10/15/2018 1017      I, Doreene Nest, am acting as Location manager for [employer]. Owens Shark, DO  I have reviewed the above documentation for accuracy and completeness, and I agree with the above. -Jearld Lesch, DO

## 2019-06-01 ENCOUNTER — Encounter (INDEPENDENT_AMBULATORY_CARE_PROVIDER_SITE_OTHER): Payer: Self-pay | Admitting: Bariatrics

## 2019-06-03 ENCOUNTER — Other Ambulatory Visit (INDEPENDENT_AMBULATORY_CARE_PROVIDER_SITE_OTHER): Payer: Self-pay | Admitting: Bariatrics

## 2019-06-03 DIAGNOSIS — K5904 Chronic idiopathic constipation: Secondary | ICD-10-CM

## 2019-06-03 DIAGNOSIS — E559 Vitamin D deficiency, unspecified: Secondary | ICD-10-CM

## 2019-06-09 ENCOUNTER — Encounter (INDEPENDENT_AMBULATORY_CARE_PROVIDER_SITE_OTHER): Payer: Self-pay | Admitting: Bariatrics

## 2019-06-09 ENCOUNTER — Other Ambulatory Visit: Payer: Self-pay

## 2019-06-09 ENCOUNTER — Telehealth (INDEPENDENT_AMBULATORY_CARE_PROVIDER_SITE_OTHER): Payer: Federal, State, Local not specified - PPO | Admitting: Bariatrics

## 2019-06-09 DIAGNOSIS — E669 Obesity, unspecified: Secondary | ICD-10-CM | POA: Diagnosis not present

## 2019-06-09 DIAGNOSIS — E1169 Type 2 diabetes mellitus with other specified complication: Secondary | ICD-10-CM

## 2019-06-09 DIAGNOSIS — Z6841 Body Mass Index (BMI) 40.0 and over, adult: Secondary | ICD-10-CM

## 2019-06-09 DIAGNOSIS — E559 Vitamin D deficiency, unspecified: Secondary | ICD-10-CM

## 2019-06-09 MED ORDER — VITAMIN D (ERGOCALCIFEROL) 1.25 MG (50000 UNIT) PO CAPS: 50000.0000 [IU] | ORAL_CAPSULE | ORAL | 0 refills | Status: AC

## 2019-06-09 NOTE — Progress Notes (Signed)
Office: [phone]  /  Fax: [phone] TeleHealth Visit:  Rebekah [name] has verbally consented to this TeleHealth visit today. The patient is located at home, the provider is located at the [facility] office. The participants in this visit include the listed provider and patient. The visit was conducted today via FaceTime.  HPI:   Chief Complaint: OBESITY Rebekah [name] is here to discuss her progress with her obesity treatment plan. She is on the Category 2 plan and is following her eating plan approximately 10% of the time. She states she is walking 2 miles 7 times per week. Rebekah [name] states that she has gained 5 lbs (weight 215). She has been traveling more. She thinks that she has retained water. No shortness of breath. We were unable to weigh the patient today for this TeleHealth visit. She feels as if she has gained 5 lbs since her last visit. She has lost 14 lbs since starting treatment with [name].  Vitamin D deficiency Rebekah [name] has a diagnosis of Vitamin D deficiency. She is currently taking prescription Vit D and denies nausea, vomiting or muscle weakness.  At risk for osteopenia and osteoporosis Rebekah [name] is at higher risk of osteopenia and osteoporosis due to Vitamin D deficiency.   Diabetes II With Complications (Stable) Rebekah [name] has a diagnosis of diabetes type II. Rebekah [name] does not report checking her blood sugars and denies hypoglycemia. She reports having a normal appetite. Last A1c was 7.0 on 10/15/2018 with an insulin of 10.6. She has been working on intensive lifestyle modifications including diet, exercise, and weight loss to help control her blood glucose levels.  ASSESSMENT AND PLAN:  Vitamin D deficiency - Plan: Vitamin D, Ergocalciferol, (DRISDOL) 1.25 MG (50000 UT) CAPS capsule  Diabetes mellitus type 2 in obese (HCC)  Class 3 severe obesity with serious comorbidity and body mass index (BMI) of 40.0 to 44.9 in adult, unspecified obesity type ([place])  PLAN:  Vitamin  D Deficiency Rebekah [name] was informed that low Vitamin D levels contributes to fatigue and are associated with obesity, breast, and colon cancer. She agrees to take prescription Vit D @ 50,000 IU every 14 days #2 with 0 refills (changing from every week) and will follow-up for routine testing of Vitamin D, at least 2-3 times per year. She was informed of the risk of over-replacement of Vitamin D and agrees to not increase her dose unless she discusses this with [name] first. [name] agrees to follow-up with our clinic in 2-3 weeks.  At risk for osteopenia and osteoporosis Rebekah [name] was given extended  (15 minutes) osteoporosis prevention counseling today. Rebekah [name] is at risk for osteopenia and osteoporosis due to her Vitamin D deficiency. She was encouraged to take her Vitamin D and follow her higher calcium diet and increase strengthening exercise to help strengthen her bones and decrease her risk of osteopenia and osteoporosis.  Diabetes II With Complications (Stable) Rebekah [name] has been given extensive diabetes education by myself today including ideal fasting and post-prandial blood glucose readings, individual ideal HgA1c goals  and hypoglycemia prevention. We discussed the importance of good blood sugar control to decrease the likelihood of diabetic complications such as nephropathy, neuropathy, limb loss, blindness, coronary artery disease, and death. We discussed the importance of intensive lifestyle modification including diet, exercise and weight loss as the first line treatment for diabetes. Rebekah [name] agrees to continue her diabetes medications, decrease carbohydrates, and will follow-up at the agreed upon time.  Obesity Rebekah [name] is currently in the action stage of change. As such,  her goal is to continue with weight loss efforts. She has agreed to follow the Category 2 plan. Rebekah [name] will work on Rebekah [name], will weigh herself at home weekly, and will practice better adherence to the plan. Rebekah [name] has increased her  walking and will continue to do so for weight loss and overall health benefits. We discussed the following Behavioral Modification Strategies today: increasing lean protein intake, decreasing simple carbohydrates, increasing vegetables, increase H20 intake, decrease eating out, no skipping meals, work on meal planning and easy cooking plans, keeping healthy foods in the home, and planning for success.  Rebekah [name] has agreed to follow-up with our clinic in 2-3 weeks. She was informed of the importance of frequent follow-up visits to maximize her success with intensive lifestyle modifications for her multiple health conditions.  ALLERGIES: Allergies  Allergen Reactions  . Crestor [Rosuvastatin] Other (See Comments)    myalgias    MEDICATIONS: Current Outpatient Medications on File Prior to Visit  Medication Sig Dispense Refill  . amLODipine (NORVASC) 5 MG tablet TAKE 1 TABLET BY MOUTH EVERY DAY 90 tablet 3  . aspirin 81 MG chewable tablet Chew 81 mg by mouth daily.    . benzonatate (TESSALON PERLES) 100 MG capsule Take 1 capsule (100 mg total) by mouth 3 (three) times daily as needed. 20 capsule 0  . estradiol (ESTRACE) 1 MG tablet Take 1 mg by mouth daily.    . furosemide (LASIX) 20 MG tablet Take 20 mg by mouth.    . hydrochlorothiazide (HYDRODIURIL) 25 MG tablet Take 25 mg by mouth daily.    Marland Kitchen HYDROcodone-acetaminophen (NORCO/VICODIN) 5-325 MG tablet Take 1-2 tablets by mouth every 6 (six) hours as needed for severe pain. 10 tablet 0  . ibuprofen (ADVIL) 800 MG tablet Take 1 tablet (800 mg total) by mouth 3 (three) times daily. 21 tablet 0  . leflunomide (ARAVA) 20 MG tablet Take 20 mg by mouth daily.    Marland Kitchen lubiprostone (AMITIZA) 24 MCG capsule Take 1 capsule (24 mcg total) by mouth 2 (two) times daily as needed. 60 capsule 0  . metFORMIN (GLUCOPHAGE) 500 MG tablet Take 1 tablet (500 mg total) by mouth 2 (two) times daily with a meal. 60 tablet 0  . simvastatin (ZOCOR) 40 MG tablet Take 1 tablet  (40 mg total) by mouth every evening. 90 tablet 3   No current facility-administered medications on file prior to visit.     PAST MEDICAL HISTORY: Past Medical History:  Diagnosis Date  . Arthritis   . Diabetes mellitus type 2 in obese ([place]) 10/10/2017  . Essential hypertension 10/10/2017  . Hypertension   . Morbid obesity ([place]) 10/10/2017  . Pure hypercholesterolemia 10/10/2017  . Rheumatoid arteritis ([place])   . Vitamin D deficiency     PAST SURGICAL HISTORY: Past Surgical History:  Procedure Laterality Date  . ABDOMINAL HYSTERECTOMY    . biopsy on tongue  1976  . BREAST EXCISIONAL BIOPSY Left   . CESAREAN SECTION    . TUBAL LIGATION      SOCIAL HISTORY: Social History   Tobacco Use  . Smoking status: Former Smoker    Quit date: 09/11/2010    Years since quitting: 8.7  . Smokeless tobacco: Never Used  Substance Use Topics  . Alcohol use: Yes    Comment: monthly   . Drug use: Not on file    FAMILY HISTORY: Family History  Problem Relation Age of Onset  . CAD Mother   . Cerebral aneurysm Father   .  Stroke Sister   . CVA Sister   . Heart attack Maternal Grandmother   . Stroke Maternal Grandmother   . Alzheimer's disease Maternal Grandfather   . Cancer Paternal Grandmother    ROS: Review of Systems  Respiratory: Negative for shortness of breath.   Gastrointestinal: Negative for nausea and vomiting.  Musculoskeletal:       Negative for muscle weakness.  Endo/Heme/Allergies:       Negative for hypoglycemia.   PHYSICAL EXAM: Pt in no acute distress  RECENT LABS AND TESTS: BMET    Component Value Date/Time   NA 138 05/28/2017 1117   K 3.7 05/28/2017 1117   CL 103 05/28/2017 1117   CO2 27 05/28/2017 1117   GLUCOSE 147 (H) 05/28/2017 1117   BUN 19 05/28/2017 1117   CREATININE 0.84 05/28/2017 1117   CALCIUM 9.2 05/28/2017 1117   GFRNONAA >60 05/28/2017 1117   GFRAA >60 05/28/2017 1117   Lab Results  Component Value Date   HGBA1C 7.0 (H) 10/15/2018    Lab Results  Component Value Date   INSULIN 10.6 10/15/2018   CBC    Component Value Date/Time   WBC 9.7 05/28/2017 1117   RBC 4.12 05/28/2017 1117   HGB 12.0 05/28/2017 1117   HCT 36.8 05/28/2017 1117   PLT 219 05/28/2017 1117   MCV 89.3 05/28/2017 1117   MCH 29.1 05/28/2017 1117   MCHC 32.6 05/28/2017 1117   RDW 13.7 05/28/2017 1117   LYMPHSABS 1.6 11/04/2016 0247   MONOABS 0.7 11/04/2016 0247   EOSABS 0.0 11/04/2016 0247   BASOSABS 0.0 11/04/2016 0247   Iron/TIBC/Ferritin/ %Sat No results found for: IRON, TIBC, FERRITIN, IRONPCTSAT Lipid Panel     Component Value Date/Time   CHOL 187 10/15/2018 1017   TRIG 75 10/15/2018 1017   HDL 67 10/15/2018 1017   LDLCALC 105 (H) 10/15/2018 1017   Hepatic Function Panel  No results found for: PROT, ALBUMIN, AST, ALT, ALKPHOS, BILITOT, BILIDIR, IBILI    Component Value Date/Time   TSH 1.060 10/15/2018 1017   No results found for: Vitamin D, 25-Hydroxy  I, Michaelene Song, am acting as Location manager for [employer], DO  I have reviewed the above documentation for accuracy and completeness, and I agree with the above. Jearld Lesch, DO

## 2019-06-10 ENCOUNTER — Other Ambulatory Visit: Payer: Self-pay

## 2019-06-10 DIAGNOSIS — Z20822 Contact with and (suspected) exposure to covid-19: Secondary | ICD-10-CM

## 2019-06-10 DIAGNOSIS — Z20828 Contact with and (suspected) exposure to other viral communicable diseases: Secondary | ICD-10-CM | POA: Diagnosis not present

## 2019-06-11 LAB — NOVEL CORONAVIRUS, NAA: SARS-CoV-2, NAA: NOT DETECTED

## 2019-06-16 ENCOUNTER — Other Ambulatory Visit: Payer: Self-pay | Admitting: Obstetrics and Gynecology

## 2019-06-16 DIAGNOSIS — Z1231 Encounter for screening mammogram for malignant neoplasm of breast: Secondary | ICD-10-CM

## 2019-06-23 DIAGNOSIS — Z6835 Body mass index (BMI) 35.0-35.9, adult: Secondary | ICD-10-CM | POA: Diagnosis not present

## 2019-06-23 DIAGNOSIS — Z1231 Encounter for screening mammogram for malignant neoplasm of breast: Secondary | ICD-10-CM | POA: Diagnosis not present

## 2019-06-23 DIAGNOSIS — Z01419 Encounter for gynecological examination (general) (routine) without abnormal findings: Secondary | ICD-10-CM | POA: Diagnosis not present

## 2019-06-30 ENCOUNTER — Ambulatory Visit (INDEPENDENT_AMBULATORY_CARE_PROVIDER_SITE_OTHER): Payer: Federal, State, Local not specified - PPO | Admitting: Bariatrics

## 2019-07-21 ENCOUNTER — Other Ambulatory Visit: Payer: Self-pay

## 2019-07-21 DIAGNOSIS — Z20822 Contact with and (suspected) exposure to covid-19: Secondary | ICD-10-CM

## 2019-07-23 DIAGNOSIS — M205X2 Other deformities of toe(s) (acquired), left foot: Secondary | ICD-10-CM | POA: Diagnosis not present

## 2019-07-23 DIAGNOSIS — M2041 Other hammer toe(s) (acquired), right foot: Secondary | ICD-10-CM | POA: Diagnosis not present

## 2019-07-23 DIAGNOSIS — M2042 Other hammer toe(s) (acquired), left foot: Secondary | ICD-10-CM | POA: Diagnosis not present

## 2019-07-23 DIAGNOSIS — M205X1 Other deformities of toe(s) (acquired), right foot: Secondary | ICD-10-CM | POA: Diagnosis not present

## 2019-07-23 LAB — NOVEL CORONAVIRUS, NAA: SARS-CoV-2, NAA: NOT DETECTED

## 2019-07-28 DIAGNOSIS — H10412 Chronic giant papillary conjunctivitis, left eye: Secondary | ICD-10-CM | POA: Diagnosis not present

## 2019-08-20 DIAGNOSIS — M2041 Other hammer toe(s) (acquired), right foot: Secondary | ICD-10-CM | POA: Diagnosis not present

## 2019-08-20 DIAGNOSIS — M205X2 Other deformities of toe(s) (acquired), left foot: Secondary | ICD-10-CM | POA: Diagnosis not present

## 2019-08-20 DIAGNOSIS — E139 Other specified diabetes mellitus without complications: Secondary | ICD-10-CM | POA: Diagnosis not present

## 2019-08-20 DIAGNOSIS — M2042 Other hammer toe(s) (acquired), left foot: Secondary | ICD-10-CM | POA: Diagnosis not present

## 2019-08-21 DIAGNOSIS — H40023 Open angle with borderline findings, high risk, bilateral: Secondary | ICD-10-CM | POA: Diagnosis not present

## 2019-09-21 ENCOUNTER — Other Ambulatory Visit (INDEPENDENT_AMBULATORY_CARE_PROVIDER_SITE_OTHER): Payer: Self-pay | Admitting: Bariatrics

## 2019-09-21 ENCOUNTER — Encounter (INDEPENDENT_AMBULATORY_CARE_PROVIDER_SITE_OTHER): Payer: Self-pay | Admitting: Bariatrics

## 2019-09-21 DIAGNOSIS — E119 Type 2 diabetes mellitus without complications: Secondary | ICD-10-CM

## 2019-09-22 ENCOUNTER — Other Ambulatory Visit: Payer: Self-pay

## 2019-09-22 ENCOUNTER — Encounter (INDEPENDENT_AMBULATORY_CARE_PROVIDER_SITE_OTHER): Payer: Self-pay | Admitting: Bariatrics

## 2019-09-22 ENCOUNTER — Ambulatory Visit (INDEPENDENT_AMBULATORY_CARE_PROVIDER_SITE_OTHER): Payer: Federal, State, Local not specified - PPO | Admitting: Bariatrics

## 2019-09-22 VITALS — BP 129/82 | HR 79 | Temp 98.9°F | Ht 65.0 in | Wt 216.0 lb

## 2019-09-22 DIAGNOSIS — E7800 Pure hypercholesterolemia, unspecified: Secondary | ICD-10-CM

## 2019-09-22 DIAGNOSIS — Z6835 Body mass index (BMI) 35.0-35.9, adult: Secondary | ICD-10-CM

## 2019-09-22 DIAGNOSIS — I10 Essential (primary) hypertension: Secondary | ICD-10-CM | POA: Diagnosis not present

## 2019-09-22 DIAGNOSIS — E119 Type 2 diabetes mellitus without complications: Secondary | ICD-10-CM | POA: Diagnosis not present

## 2019-09-22 DIAGNOSIS — Z9189 Other specified personal risk factors, not elsewhere classified: Secondary | ICD-10-CM

## 2019-09-22 DIAGNOSIS — R600 Localized edema: Secondary | ICD-10-CM

## 2019-09-22 DIAGNOSIS — E559 Vitamin D deficiency, unspecified: Secondary | ICD-10-CM | POA: Diagnosis not present

## 2019-09-22 MED ORDER — METFORMIN HCL 500 MG PO TABS: 500.0000 mg | ORAL_TABLET | Freq: Two times a day (BID) | ORAL | 0 refills | Status: DC

## 2019-09-22 MED ORDER — VITAMIN D (ERGOCALCIFEROL) 1.25 MG (50000 UNIT) PO CAPS: 50000.0000 [IU] | ORAL_CAPSULE | ORAL | 0 refills | Status: DC

## 2019-09-22 NOTE — Progress Notes (Signed)
Chief Complaint:   OBESITY Rebekah [name] is here to discuss her progress with her obesity treatment plan along with follow-up of her obesity related diagnoses. Rebekah [name] is on the Category 1 Plan and states she is following her eating plan approximately 0% of the time. Rebekah [name] states she is exercising 0 minutes 0 times per week.  Today's visit was #: 18 Starting weight: 230 lbs Starting date: 10/15/2018 Today's weight: 216 lbs Today's date: 09/22/2019 Total lbs lost to date: 14 Total lbs lost since last in-office visit: 0  Interim History: Rebekah [name] weight remains the same. Her last in-office visit was 11/03/2018, but has done TeleHealth. She reports drinking 64 ounces of water.  Subjective:   Type 2 diabetes mellitus without complication, without long-term current use of insulin ([place]). Rebekah [name] is taking Glucophage.  Lab Results  Component Value Date   HGBA1C 7.0 (H) 10/15/2018   Lab Results  Component Value Date   LDLCALC 105 (H) 10/15/2018   CREATININE 0.84 05/28/2017   Lab Results  Component Value Date   INSULIN 10.6 10/15/2018   Essential hypertension. Rebekah [name] is taking Norvasc and HCTZ. Blood pressure is reasonably well controlled.  BP Readings from Last 3 Encounters:  09/22/19 129/82  11/03/18 117/77  10/15/18 (!) 173/90   Lab Results  Component Value Date   CREATININE 0.84 05/28/2017   CREATININE 0.70 11/04/2016   CREATININE 0.75 11/04/2016   Pure hypercholesterolemia. Rebekah [name] is taking Zocor.  Lower extremity edema. Rebekah [name] is taking Lasix as needed.  Vitamin D deficiency. No nausea, vomiting, or muscle weakness.  At risk for hypoglycemia. Rebekah [name] is at increased risk for hypoglycemia due to diabetes.   Assessment/Plan:   Type 2 diabetes mellitus without complication, without long-term current use of insulin ([place]). Good blood sugar control is important to decrease the likelihood of diabetic complications such as nephropathy, neuropathy, limb loss,  blindness, coronary artery disease, and death. Intensive lifestyle modification including diet, exercise and weight loss are the first line of treatment for diabetes. Comprehensive metabolic panel, Insulin, random, Hemoglobin A1c labs ordered. Sita was given a prescription for metFORMIN (GLUCOPHAGE) 500 MG tablet 1 BID #60 with 0 refills.  Essential hypertension. Rebekah [name] is working on healthy weight loss and exercise to improve blood pressure control. We will watch for signs of hypotension as she continues her lifestyle modifications. She will continue medications as prescribed.  Pure hypercholesterolemia. Rebekah [name] will continue medications as prescribed. Lipid Panel With LDL/HDL Ratio labs ordered.  Lower extremity edema. Rebekah [name] was given a prescription for Lasix 20 mg 1 PO daily #30 with 0 refills.  Vitamin D deficiency. Low Vitamin D level contributes to fatigue and are associated with obesity, breast, and colon cancer. She was given a prescription for Vitamin D, Ergocalciferol, (DRISDOL) 1.25 MG (50000 UNIT) CAPS capsule every week #4 with 0 refills and VITAMIN D 25 Hydroxy (Vit-D Deficiency, Fractures) level was ordered.  At risk for hypoglycemia. Rebekah [name] was given approximately 15 minutes of counseling today regarding prevention of hypoglycemia. She was advised of symptoms of hypoglycemia. Rebekah [name] was instructed to avoid skipping meals, eat regular protein rich meals and schedule low calorie snacks as needed.   Repetitive spaced learning was employed today to elicit superior memory formation and behavioral change.  Class 2 severe obesity with serious comorbidity and body mass index (BMI) of 35.0 to 35.9 in adult, unspecified obesity type ([place]).  Rebekah [name] is currently in the action stage of change. As such, her goal is to continue with weight  loss efforts. She has agreed to the Category 1 Plan.   She will work on meal planning, intentional eating, and will be more adherent to the plan.  Exercise goals:  All adults should avoid inactivity. Some physical activity is better than none, and adults who participate in any amount of physical activity gain some health benefits.  Behavioral modification strategies: increasing lean protein intake, decreasing simple carbohydrates, increasing vegetables, increasing water intake, decreasing eating out, no skipping meals, meal planning and cooking strategies and keeping healthy foods in the home.  Rebekah [name] has agreed to follow-up with our clinic in 2-3 weeks. She was informed of the importance of frequent follow-up visits to maximize her success with intensive lifestyle modifications for her multiple health conditions.   Rebekah [name] was informed we would discuss her lab results at her next visit unless there is a critical issue that needs to be addressed sooner. Rebekah [name] agreed to keep her next visit at the agreed upon time to discuss these results.  Objective:   Blood pressure 129/82, pulse 79, temperature 98.9 F (37.2 C), height 5\' 5"  (1.651 m), weight 216 lb (98 kg), last menstrual period 08/13/1998, SpO2 99 %. Body mass index is 35.94 kg/m.  General: Cooperative, alert, well developed, in no acute distress. HEENT: Conjunctivae and lids unremarkable. Cardiovascular: Regular rhythm.  Lungs: Normal work of breathing. Neurologic: No focal deficits.   Lab Results  Component Value Date   CREATININE 0.84 05/28/2017   BUN 19 05/28/2017   NA 138 05/28/2017   K 3.7 05/28/2017   CL 103 05/28/2017   CO2 27 05/28/2017   No results found for: ALT, AST, GGT, ALKPHOS, BILITOT Lab Results  Component Value Date   HGBA1C 7.0 (H) 10/15/2018   Lab Results  Component Value Date   INSULIN 10.6 10/15/2018   Lab Results  Component Value Date   TSH 1.060 10/15/2018   Lab Results  Component Value Date   CHOL 187 10/15/2018   HDL 67 10/15/2018   LDLCALC 105 (H) 10/15/2018   TRIG 75 10/15/2018   Lab Results  Component Value Date   WBC 9.7 05/28/2017   HGB 12.0  05/28/2017   HCT 36.8 05/28/2017   MCV 89.3 05/28/2017   PLT 219 05/28/2017   No results found for: IRON, TIBC, FERRITIN  Attestation Statements:   Reviewed by clinician on day of visit: allergies, medications, problem list, medical history, surgical history, family history, social history, and previous encounter notes.  Migdalia Dk, am acting as Location manager for [employer], DO   I have reviewed the above documentation for accuracy and completeness, and I agree with the above. Jearld Lesch, DO

## 2019-09-23 ENCOUNTER — Encounter (INDEPENDENT_AMBULATORY_CARE_PROVIDER_SITE_OTHER): Payer: Self-pay | Admitting: Bariatrics

## 2019-09-23 ENCOUNTER — Other Ambulatory Visit (INDEPENDENT_AMBULATORY_CARE_PROVIDER_SITE_OTHER): Payer: Self-pay

## 2019-09-23 DIAGNOSIS — E119 Type 2 diabetes mellitus without complications: Secondary | ICD-10-CM

## 2019-09-23 DIAGNOSIS — E559 Vitamin D deficiency, unspecified: Secondary | ICD-10-CM

## 2019-09-23 MED ORDER — VITAMIN D (ERGOCALCIFEROL) 1.25 MG (50000 UNIT) PO CAPS: 50000.0000 [IU] | ORAL_CAPSULE | ORAL | 0 refills | Status: DC

## 2019-09-23 MED ORDER — METFORMIN HCL 500 MG PO TABS: 500.0000 mg | ORAL_TABLET | Freq: Two times a day (BID) | ORAL | 0 refills | Status: DC

## 2019-09-24 DIAGNOSIS — H40023 Open angle with borderline findings, high risk, bilateral: Secondary | ICD-10-CM | POA: Diagnosis not present

## 2019-10-02 ENCOUNTER — Encounter (INDEPENDENT_AMBULATORY_CARE_PROVIDER_SITE_OTHER): Payer: Self-pay | Admitting: Bariatrics

## 2019-10-06 ENCOUNTER — Ambulatory Visit (INDEPENDENT_AMBULATORY_CARE_PROVIDER_SITE_OTHER): Payer: Federal, State, Local not specified - PPO | Admitting: Bariatrics

## 2019-10-09 DIAGNOSIS — Z23 Encounter for immunization: Secondary | ICD-10-CM | POA: Diagnosis not present

## 2019-10-21 ENCOUNTER — Ambulatory Visit: Payer: Federal, State, Local not specified - PPO | Attending: Internal Medicine

## 2019-10-21 DIAGNOSIS — Z20822 Contact with and (suspected) exposure to covid-19: Secondary | ICD-10-CM | POA: Diagnosis not present

## 2019-10-22 LAB — NOVEL CORONAVIRUS, NAA: SARS-CoV-2, NAA: NOT DETECTED

## 2019-11-12 DIAGNOSIS — M205X1 Other deformities of toe(s) (acquired), right foot: Secondary | ICD-10-CM | POA: Diagnosis not present

## 2019-11-12 DIAGNOSIS — M2042 Other hammer toe(s) (acquired), left foot: Secondary | ICD-10-CM | POA: Diagnosis not present

## 2019-11-12 DIAGNOSIS — E139 Other specified diabetes mellitus without complications: Secondary | ICD-10-CM | POA: Diagnosis not present

## 2019-11-12 DIAGNOSIS — M2041 Other hammer toe(s) (acquired), right foot: Secondary | ICD-10-CM | POA: Diagnosis not present

## 2019-11-12 DIAGNOSIS — M205X2 Other deformities of toe(s) (acquired), left foot: Secondary | ICD-10-CM | POA: Diagnosis not present

## 2019-11-30 DIAGNOSIS — L259 Unspecified contact dermatitis, unspecified cause: Secondary | ICD-10-CM | POA: Diagnosis not present

## 2019-12-03 ENCOUNTER — Other Ambulatory Visit (INDEPENDENT_AMBULATORY_CARE_PROVIDER_SITE_OTHER): Payer: Self-pay | Admitting: Bariatrics

## 2019-12-03 DIAGNOSIS — E119 Type 2 diabetes mellitus without complications: Secondary | ICD-10-CM

## 2019-12-03 DIAGNOSIS — E559 Vitamin D deficiency, unspecified: Secondary | ICD-10-CM

## 2019-12-07 ENCOUNTER — Other Ambulatory Visit: Payer: Self-pay | Admitting: Cardiovascular Disease

## 2019-12-09 ENCOUNTER — Other Ambulatory Visit (INDEPENDENT_AMBULATORY_CARE_PROVIDER_SITE_OTHER): Payer: Self-pay | Admitting: Bariatrics

## 2019-12-09 DIAGNOSIS — E119 Type 2 diabetes mellitus without complications: Secondary | ICD-10-CM

## 2019-12-14 ENCOUNTER — Other Ambulatory Visit: Payer: Self-pay

## 2019-12-14 ENCOUNTER — Ambulatory Visit (INDEPENDENT_AMBULATORY_CARE_PROVIDER_SITE_OTHER): Payer: Federal, State, Local not specified - PPO | Admitting: Bariatrics

## 2019-12-14 ENCOUNTER — Encounter (INDEPENDENT_AMBULATORY_CARE_PROVIDER_SITE_OTHER): Payer: Self-pay | Admitting: Bariatrics

## 2019-12-14 VITALS — BP 111/72 | HR 82 | Temp 98.2°F | Ht 65.0 in | Wt 214.0 lb

## 2019-12-14 DIAGNOSIS — Z6835 Body mass index (BMI) 35.0-35.9, adult: Secondary | ICD-10-CM

## 2019-12-14 DIAGNOSIS — I10 Essential (primary) hypertension: Secondary | ICD-10-CM

## 2019-12-14 DIAGNOSIS — E669 Obesity, unspecified: Secondary | ICD-10-CM

## 2019-12-14 DIAGNOSIS — Z9189 Other specified personal risk factors, not elsewhere classified: Secondary | ICD-10-CM | POA: Diagnosis not present

## 2019-12-14 DIAGNOSIS — E1169 Type 2 diabetes mellitus with other specified complication: Secondary | ICD-10-CM

## 2019-12-14 MED ORDER — METFORMIN HCL 500 MG PO TABS: 500.0000 mg | ORAL_TABLET | Freq: Two times a day (BID) | ORAL | 0 refills | Status: DC

## 2019-12-14 NOTE — Progress Notes (Signed)
Chief Complaint:   OBESITY Rebekah [name] is here to discuss her progress with her obesity treatment plan along with follow-up of her obesity related diagnoses. Rebekah [name] is on the Category 1 Plan and states she is following her eating plan approximately 50% of the time. [name] states she is exercising 0 minutes 0 times per week.  Today's visit was #: 2 Starting weight: 230 lbs Starting date: 03//11/2018 Today's weight: 214 lbs Today's date: 12/14/2019 Total lbs lost to date: 16 Total lbs lost since last in-office visit: 2  Interim History: Rebekah [name]'s last visit was in February of this year. She is down 2 lbs. She reports she has been more stressed.  Subjective:   Type 2 diabetes mellitus with obesity ([place]). No polyphagia.   Lab Results  Component Value Date   HGBA1C 7.0 (H) 10/15/2018   Lab Results  Component Value Date   LDLCALC 105 (H) 10/15/2018   CREATININE 0.84 05/28/2017   Lab Results  Component Value Date   INSULIN 10.6 10/15/2018    Essential hypertension. Blood pressure is controlled at 111/72 today.  BP Readings from Last 3 Encounters:  12/14/19 111/72  09/22/19 129/82  11/03/18 117/77   Lab Results  Component Value Date   CREATININE 0.84 05/28/2017   CREATININE 0.70 11/04/2016   CREATININE 0.75 11/04/2016   At risk for hypoglycemia. Rebekah [name] is at increased risk for hypoglycemia due to diabetes mellitus type II.   Assessment/Plan:   Type 2 diabetes mellitus with obesity ([place]). Good blood sugar control is important to decrease the likelihood of diabetic complications such as nephropathy, neuropathy, limb loss, blindness, coronary artery disease, and death. Intensive lifestyle modification including diet, exercise and weight loss are the first line of treatment for diabetes. Prescription was given for metFORMIN (GLUCOPHAGE) 500 MG tablet 1 BID with meal #60 with 0 refills.  Essential hypertension. [name] is working on healthy weight loss and  exercise to improve blood pressure control. We will watch for signs of hypotension as she continues her lifestyle modifications. She will continue her current medications as directed.  At risk for hypoglycemia. Rebekah [name] was given approximately 15 minutes of counseling today regarding prevention of hypoglycemia. She was advised of symptoms of hypoglycemia. Rebekah [name] was instructed to avoid skipping meals, eat regular protein rich meals and schedule low calorie snacks as needed.   Repetitive spaced learning was employed today to elicit superior memory formation and behavioral change.  Class 2 severe obesity with serious comorbidity and body mass index (BMI) of 35.0 to 35.9 in adult, unspecified obesity type ([place]).  Rebekah [name] is currently in the action stage of change. As such, her goal is to continue with weight loss efforts. She has agreed to the Category 1 Plan.   She will work on meal planning.  Exercise goals: Rebekah [name] will begin walking 3 times per week.  Behavioral modification strategies: increasing lean protein intake, decreasing simple carbohydrates, increasing vegetables, increasing water intake, decreasing eating out, no skipping meals, meal planning and cooking strategies, keeping healthy foods in the home and planning for success.  Rebekah [name] has agreed to follow-up with our clinic in 2 weeks. She was informed of the importance of frequent follow-up visits to maximize her success with intensive lifestyle modifications for her multiple health conditions.   Objective:   Blood pressure 111/72, pulse 82, temperature 98.2 F (36.8 C), height 5\' 5"  (1.651 m), weight 214 lb (97.1 kg), last menstrual period 08/13/1998, SpO2 99 %. Body mass index is 35.61 kg/m.  General: Cooperative, alert, well developed, in no acute distress. HEENT: Conjunctivae and lids unremarkable. Cardiovascular: Regular rhythm.  Lungs: Normal work of breathing. Neurologic: No focal deficits.   Lab Results  Component Value Date     CREATININE 0.84 05/28/2017   BUN 19 05/28/2017   NA 138 05/28/2017   K 3.7 05/28/2017   CL 103 05/28/2017   CO2 27 05/28/2017   No results found for: ALT, AST, GGT, ALKPHOS, BILITOT Lab Results  Component Value Date   HGBA1C 7.0 (H) 10/15/2018   Lab Results  Component Value Date   INSULIN 10.6 10/15/2018   Lab Results  Component Value Date   TSH 1.060 10/15/2018   Lab Results  Component Value Date   CHOL 187 10/15/2018   HDL 67 10/15/2018   LDLCALC 105 (H) 10/15/2018   TRIG 75 10/15/2018   Lab Results  Component Value Date   WBC 9.7 05/28/2017   HGB 12.0 05/28/2017   HCT 36.8 05/28/2017   MCV 89.3 05/28/2017   PLT 219 05/28/2017   No results found for: IRON, TIBC, FERRITIN  Attestation Statements:   Reviewed by clinician on day of visit: allergies, medications, problem list, medical history, surgical history, family history, social history, and previous encounter notes.  Migdalia Dk, am acting as Location manager for [employer], DO   I have reviewed the above documentation for accuracy and completeness, and I agree with the above. Jearld Lesch, DO

## 2020-01-08 ENCOUNTER — Encounter (INDEPENDENT_AMBULATORY_CARE_PROVIDER_SITE_OTHER): Payer: Self-pay | Admitting: Bariatrics

## 2020-01-12 ENCOUNTER — Ambulatory Visit (INDEPENDENT_AMBULATORY_CARE_PROVIDER_SITE_OTHER): Payer: Federal, State, Local not specified - PPO | Admitting: Bariatrics

## 2020-01-12 NOTE — Telephone Encounter (Signed)
Please r/s

## 2020-01-15 ENCOUNTER — Other Ambulatory Visit: Payer: Self-pay

## 2020-01-15 ENCOUNTER — Ambulatory Visit: Payer: Federal, State, Local not specified - PPO | Admitting: Cardiovascular Disease

## 2020-01-15 ENCOUNTER — Encounter: Payer: Self-pay | Admitting: Cardiovascular Disease

## 2020-01-15 VITALS — BP 140/92 | HR 75 | Ht 65.0 in | Wt 217.2 lb

## 2020-01-15 DIAGNOSIS — R0602 Shortness of breath: Secondary | ICD-10-CM | POA: Diagnosis not present

## 2020-01-15 DIAGNOSIS — I471 Supraventricular tachycardia: Secondary | ICD-10-CM

## 2020-01-15 DIAGNOSIS — E7800 Pure hypercholesterolemia, unspecified: Secondary | ICD-10-CM

## 2020-01-15 DIAGNOSIS — Z5181 Encounter for therapeutic drug level monitoring: Secondary | ICD-10-CM

## 2020-01-15 DIAGNOSIS — I10 Essential (primary) hypertension: Secondary | ICD-10-CM

## 2020-01-15 MED ORDER — AMLODIPINE BESYLATE 10 MG PO TABS: 10.0000 mg | ORAL_TABLET | Freq: Every day | ORAL | 3 refills | Status: DC

## 2020-01-15 MED ORDER — FUROSEMIDE 40 MG PO TABS: 40.0000 mg | ORAL_TABLET | Freq: Every day | ORAL | 1 refills | Status: DC

## 2020-01-15 NOTE — Progress Notes (Signed)
Cardiology Office Note    Date:  01/17/2020   ID:  LEI DOWER, DOB Feb 03, 1956, MRN [id number]  PCP:  Jonathon Jordan, MD  Cardiologist:  Skeet Latch, MD  Electrophysiologist:  None   Evaluation Performed:  Follow-Up Visit  Chief Complaint:  hypertension  History of Present Illness:    Rebekah [name] is a 64 y.o. female with hypertension, hyperlipidemia, Rheumatoid arthritis and diabetes here for follow up.  She was initially seen 09/2017 for the evaluation of chest pain.  She initially presented to urgent care 05/2017 with chest pain.  At the time she reported exertional chest pain that occurred while walking on the treadmill or walking for exercise.  The episodes lasted for several hours at a time.  She felt as though her at the center of her chest hurt when she tried to take a deep breath. In the ED her EKG revealed LVH with repolarization abnormalities versus lateral ST elevations.  Cardiac enzymes were negative.  D-dimer was mildly elevated and she had recently flown across the country.  She had a chest CT that was negative for pulmonary embolism.  She followed up with Dr. Stephanie Acre and was referred to cardiology given her risk factors.  She had an ETT 10/2017 that was negative for ischemia.   Since her last appointment Ms. Pickart has been doing well.  She participates in the [employer] [facility] program and has lost 15 lb. she has not been working out as much lately but still tries to when she is able.  She has no exertional chest pain or shortness of breath.  She denies orthopnea or PND but has noted more swelling lately.  She takes both Lasix and hydrochlorothiazide and notices that she does not urinate as much.  She has been stressed lately because her daughter is getting married in August and she does not like her fianc.   Past Medical History:  Diagnosis Date  . Arthritis   . Atrial tachycardia ([place]) 01/17/2020  . Diabetes mellitus type 2 in obese ([place])  10/10/2017  . Essential hypertension 10/10/2017  . Hypertension   . Morbid obesity ([place]) 10/10/2017  . Pure hypercholesterolemia 10/10/2017  . Rheumatoid arteritis ([place])   . Vitamin D deficiency    Past Surgical History:  Procedure Laterality Date  . ABDOMINAL HYSTERECTOMY    . biopsy on tongue  1976  . BREAST EXCISIONAL BIOPSY Left   . CESAREAN SECTION    . TUBAL LIGATION       Current Meds  Medication Sig  . amLODipine (NORVASC) 10 MG tablet Take 1 tablet (10 mg total) by mouth daily.  Marland Kitchen aspirin 81 MG chewable tablet Chew 81 mg by mouth daily.  Marland Kitchen estradiol (ESTRACE) 1 MG tablet Take 1 mg by mouth daily.  . furosemide (LASIX) 40 MG tablet Take 1 tablet (40 mg total) by mouth daily.  Marland Kitchen HYDROcodone-acetaminophen (NORCO/VICODIN) 5-325 MG tablet Take 1-2 tablets by mouth every 6 (six) hours as needed for severe pain.  Marland Kitchen ibuprofen (ADVIL) 800 MG tablet Take 1 tablet (800 mg total) by mouth 3 (three) times daily.  Marland Kitchen leflunomide (ARAVA) 20 MG tablet Take 20 mg by mouth daily.  Marland Kitchen lubiprostone (AMITIZA) 24 MCG capsule Take 1 capsule (24 mcg total) by mouth 2 (two) times daily as needed.  . metFORMIN (GLUCOPHAGE) 500 MG tablet Take 1 tablet (500 mg total) by mouth 2 (two) times daily with a meal.  . simvastatin (ZOCOR) 40 MG tablet Take 1 tablet (  40 mg total) by mouth every evening.  . Vitamin D, Ergocalciferol, (DRISDOL) 1.25 MG (50000 UNIT) CAPS capsule Take 1 capsule (50,000 Units total) by mouth every 7 (seven) days.  . [DISCONTINUED] amLODipine (NORVASC) 5 MG tablet TAKE 1 TABLET BY MOUTH EVERY DAY  . [DISCONTINUED] furosemide (LASIX) 20 MG tablet Take 20 mg by mouth.  . [DISCONTINUED] hydrochlorothiazide (HYDRODIURIL) 25 MG tablet Take 25 mg by mouth daily.     Allergies:   Crestor [rosuvastatin]   Social History   Tobacco Use  . Smoking status: Former Smoker    Quit date: 09/11/2010    Years since quitting: 9.3  . Smokeless tobacco: Never Used  Substance Use Topics  . Alcohol  use: Yes    Comment: monthly   . Drug use: Not on file     Family Hx: The patient's family history includes Alzheimer's disease in her maternal grandfather; CAD in her mother; CVA in her sister; Cancer in her paternal grandmother; Cerebral aneurysm in her father; Heart attack in her maternal grandmother; Stroke in her maternal grandmother and sister.  ROS:   Please see the history of present illness.     All other systems reviewed and are negative.   Prior CV studies:   The following studies were reviewed today:  ETT 10/2017:  There was no ST segment deviation noted during stress.  15min, 41sec - good exercise time  Low risk exercise treadmill test with no electrocardiographic evidence of ischemia. Normal  Labs/Other Tests and Data Reviewed:    EKG:  An ECG dated 01/15/2020 was personally reviewed today and demonstrated:  Sinus rhythm.  Rate 73 bpm.  01/15/20: Atrial tachycardia.  3: 1 AV conduction.  LAD.  LVH with secondary repolarization abnormalities  Recent Labs: No results found for requested labs within last 8760 hours.   Recent Lipid Panel Lab Results  Component Value Date/Time   CHOL 187 10/15/2018 10:17 AM   TRIG 75 10/15/2018 10:17 AM   HDL 67 10/15/2018 10:17 AM   LDLCALC 105 (H) 10/15/2018 10:17 AM    Wt Readings from Last 3 Encounters:  01/15/20 217 lb 3.2 oz (98.5 kg)  12/14/19 214 lb (97.1 kg)  09/22/19 216 lb (98 kg)     Objective:    VS:  BP (!) 140/92   Pulse 75   Ht 5\' 5"  (1.651 m)   Wt 217 lb 3.2 oz (98.5 kg)   LMP 08/13/1998   SpO2 98%   BMI 36.14 kg/m  , BMI Body mass index is 36.14 kg/m. GENERAL:  Well appearing HEENT: Pupils equal round and reactive, fundi not visualized, oral mucosa unremarkable NECK: JVP at clavicle sitting upright.  Waveform within normal limits, carotid upstroke brisk and symmetric, no bruits, no thyromegaly LUNGS:  Clear to auscultation bilaterally HEART:  RRR.  PMI not displaced or sustained,S1 and S2 within  normal limits, no S3, no S4, no clicks, no rubs, no murmurs ABD:  Flat, positive bowel sounds normal in frequency in pitch, no bruits, no rebound, no guarding, no midline pulsatile mass, no hepatomegaly, no splenomegaly EXT:  2 plus pulses throughout, no edema, no cyanosis no clubbing SKIN:  No rashes no nodules NEURO:  Cranial nerves II through XII grossly intact, motor grossly intact throughout PSYCH:  Cognitively intact, oriented to person place and time   ASSESSMENT & PLAN:    # Exertional chest pain: Resolved.  ETT was negative for ischemia.  Was encouraged to start back exercising.  # Hypertension:  Blood  pressure was initially elevated but much better on repeat.  Given that she has more swelling and her JVP is mildly elevated, we will stop hydrochlorothiazide and increase Lasix to 40 mg.  Check CMP in a week.  Increase amlodipine to 10 mg.  # Hyperlipidemia:   Continue simvastatin.  The dose was increased at her last appointment.  We will get fasting lipids and CMP in 1 week.  # Obesity: Encouraged exercise and continued weight loss. She is working with the [employer] program.   # Atrial tachycardia:  3:1 flutter on EKG.  Ventricular rate is stable and she is asymptomatic.    Medication Adjustments/Labs and Tests Ordered: Current medicines are reviewed at length with the patient today.  Concerns regarding medicines are outlined above.   Tests Ordered: Orders Placed This Encounter  Procedures  . Lipid panel  . Comprehensive metabolic panel  . EKG 12-Lead    Medication Changes: Meds ordered this encounter  Medications  . furosemide (LASIX) 40 MG tablet    Sig: Take 1 tablet (40 mg total) by mouth daily.    Dispense:  90 tablet    Refill:  1    NEW DOSE, D/C PREVIOUS MG  . amLODipine (NORVASC) 10 MG tablet    Sig: Take 1 tablet (10 mg total) by mouth daily.    Dispense:  90 tablet    Refill:  3    NEW DOSE, D/C PREVIOUS MG    Disposition:  Follow  up in 1 year(s)  Signed, Skeet Latch, MD  01/17/2020 8:27 AM    [facility]

## 2020-01-15 NOTE — Patient Instructions (Addendum)
Medication Instructions:  STOP HYDROCHLOROTHIAZIDE  INCREASE YOUR FUROSEMIDE TO 40 MG DAILY   INCREASE YOUR AMLODIPINE TO 10 MG DAILY    *If you need a refill on your cardiac medications before your next appointment, please call your pharmacy*  Lab Work: FASTING LP/CMET IN 1 WEEK   If you have labs (blood work) drawn today and your tests are completely normal, you will receive your results only by: Marland Kitchen MyChart Message (if you have MyChart) OR . A paper copy in the mail If you have any lab test that is abnormal or we need to change your treatment, we will call you to review the results.  Testing/Procedures: NONE  Follow-Up: At [facility], you and your health needs are our priority.  As part of our continuing mission to provide you with exceptional heart care, we have created designated Provider Care Teams.  These Care Teams include your primary Cardiologist (physician) and Advanced Practice Providers (APPs -  Physician Assistants and Nurse Practitioners) who all work together to provide you with the care you need, when you need it.  We recommend signing up for the patient portal called "MyChart".  Sign up information is provided on this After Visit Summary.  MyChart is used to connect with patients for Virtual Visits (Telemedicine).  Patients are able to view lab/test results, encounter notes, upcoming appointments, etc.  Non-urgent messages can be sent to your provider as well.   To learn more about what you can do with MyChart, go to [email or web address].    Your next appointment:   1-2  month(s)  The format for your next appointment:   In Person  Provider:   You may see DR [facility]  or one of the following Advanced Practice Providers on your designated Care Team:    Kerin Ransom, PA-C  [place], [place]  Coletta Memos, [place]  Other Instructions MONITOR AND LOG YOUR BLOOD PRESSURE AT HOME, [place]

## 2020-01-17 ENCOUNTER — Encounter: Payer: Self-pay | Admitting: Cardiovascular Disease

## 2020-01-17 DIAGNOSIS — I4719 Other supraventricular tachycardia: Secondary | ICD-10-CM

## 2020-01-17 DIAGNOSIS — I471 Supraventricular tachycardia: Secondary | ICD-10-CM

## 2020-01-17 HISTORY — DX: Supraventricular tachycardia: I47.1

## 2020-01-17 HISTORY — DX: Other supraventricular tachycardia: I47.19

## 2020-01-21 DIAGNOSIS — M2042 Other hammer toe(s) (acquired), left foot: Secondary | ICD-10-CM | POA: Diagnosis not present

## 2020-01-21 DIAGNOSIS — M205X2 Other deformities of toe(s) (acquired), left foot: Secondary | ICD-10-CM | POA: Diagnosis not present

## 2020-01-21 DIAGNOSIS — M2041 Other hammer toe(s) (acquired), right foot: Secondary | ICD-10-CM | POA: Diagnosis not present

## 2020-01-21 DIAGNOSIS — M205X1 Other deformities of toe(s) (acquired), right foot: Secondary | ICD-10-CM | POA: Diagnosis not present

## 2020-01-21 DIAGNOSIS — E139 Other specified diabetes mellitus without complications: Secondary | ICD-10-CM | POA: Diagnosis not present

## 2020-01-29 ENCOUNTER — Other Ambulatory Visit: Payer: Self-pay | Admitting: Cardiovascular Disease

## 2020-01-29 DIAGNOSIS — H40023 Open angle with borderline findings, high risk, bilateral: Secondary | ICD-10-CM | POA: Diagnosis not present

## 2020-02-23 ENCOUNTER — Other Ambulatory Visit (INDEPENDENT_AMBULATORY_CARE_PROVIDER_SITE_OTHER): Payer: Self-pay | Admitting: Bariatrics

## 2020-02-23 DIAGNOSIS — E559 Vitamin D deficiency, unspecified: Secondary | ICD-10-CM

## 2020-02-23 DIAGNOSIS — R21 Rash and other nonspecific skin eruption: Secondary | ICD-10-CM | POA: Diagnosis not present

## 2020-03-02 DIAGNOSIS — M069 Rheumatoid arthritis, unspecified: Secondary | ICD-10-CM | POA: Diagnosis not present

## 2020-03-02 DIAGNOSIS — Z79899 Other long term (current) drug therapy: Secondary | ICD-10-CM | POA: Diagnosis not present

## 2020-03-02 DIAGNOSIS — E119 Type 2 diabetes mellitus without complications: Secondary | ICD-10-CM | POA: Diagnosis not present

## 2020-03-02 DIAGNOSIS — Z7984 Long term (current) use of oral hypoglycemic drugs: Secondary | ICD-10-CM | POA: Diagnosis not present

## 2020-03-22 ENCOUNTER — Ambulatory Visit: Payer: Federal, State, Local not specified - PPO | Admitting: Cardiovascular Disease

## 2020-05-09 ENCOUNTER — Ambulatory Visit: Payer: Federal, State, Local not specified - PPO | Admitting: Cardiovascular Disease

## 2020-05-20 ENCOUNTER — Other Ambulatory Visit: Payer: Self-pay | Admitting: Family Medicine

## 2020-05-20 DIAGNOSIS — Z79899 Other long term (current) drug therapy: Secondary | ICD-10-CM | POA: Diagnosis not present

## 2020-05-20 DIAGNOSIS — M25562 Pain in left knee: Secondary | ICD-10-CM | POA: Diagnosis not present

## 2020-05-20 DIAGNOSIS — E041 Nontoxic single thyroid nodule: Secondary | ICD-10-CM | POA: Diagnosis not present

## 2020-05-20 DIAGNOSIS — M79645 Pain in left finger(s): Secondary | ICD-10-CM | POA: Diagnosis not present

## 2020-05-20 DIAGNOSIS — Z23 Encounter for immunization: Secondary | ICD-10-CM | POA: Diagnosis not present

## 2020-05-20 DIAGNOSIS — E119 Type 2 diabetes mellitus without complications: Secondary | ICD-10-CM | POA: Diagnosis not present

## 2020-05-20 DIAGNOSIS — E559 Vitamin D deficiency, unspecified: Secondary | ICD-10-CM | POA: Diagnosis not present

## 2020-05-26 ENCOUNTER — Ambulatory Visit
Admission: RE | Admit: 2020-05-26 | Discharge: 2020-05-26 | Disposition: A | Discharge status: Home / Self Care | Payer: Federal, State, Local not specified - PPO | Source: Ambulatory Visit | Attending: Family Medicine | Admitting: Family Medicine

## 2020-05-26 ENCOUNTER — Other Ambulatory Visit: Payer: Self-pay

## 2020-05-26 ENCOUNTER — Other Ambulatory Visit: Payer: Self-pay | Admitting: Family Medicine

## 2020-05-26 DIAGNOSIS — M79645 Pain in left finger(s): Secondary | ICD-10-CM

## 2020-05-26 DIAGNOSIS — T1490XA Injury, unspecified, initial encounter: Secondary | ICD-10-CM

## 2020-05-26 DIAGNOSIS — M25562 Pain in left knee: Secondary | ICD-10-CM

## 2020-05-26 DIAGNOSIS — M7989 Other specified soft tissue disorders: Secondary | ICD-10-CM | POA: Diagnosis not present

## 2020-05-26 DIAGNOSIS — S6992XA Unspecified injury of left wrist, hand and finger(s), initial encounter: Secondary | ICD-10-CM | POA: Diagnosis not present

## 2020-05-26 DIAGNOSIS — M1712 Unilateral primary osteoarthritis, left knee: Secondary | ICD-10-CM | POA: Diagnosis not present

## 2020-05-26 DIAGNOSIS — S8992XA Unspecified injury of left lower leg, initial encounter: Secondary | ICD-10-CM | POA: Diagnosis not present

## 2020-06-09 ENCOUNTER — Other Ambulatory Visit (INDEPENDENT_AMBULATORY_CARE_PROVIDER_SITE_OTHER): Payer: Self-pay | Admitting: Bariatrics

## 2020-06-09 DIAGNOSIS — E559 Vitamin D deficiency, unspecified: Secondary | ICD-10-CM

## 2020-06-13 NOTE — Telephone Encounter (Signed)
This patient was last seen by Dr. Owens Shark, and does not currently have an upcoming appointment.

## 2020-06-15 DIAGNOSIS — Z20822 Contact with and (suspected) exposure to covid-19: Secondary | ICD-10-CM | POA: Diagnosis not present

## 2020-07-04 DIAGNOSIS — Z01419 Encounter for gynecological examination (general) (routine) without abnormal findings: Secondary | ICD-10-CM | POA: Diagnosis not present

## 2020-07-04 DIAGNOSIS — Z6837 Body mass index (BMI) 37.0-37.9, adult: Secondary | ICD-10-CM | POA: Diagnosis not present

## 2020-07-04 DIAGNOSIS — Z1231 Encounter for screening mammogram for malignant neoplasm of breast: Secondary | ICD-10-CM | POA: Diagnosis not present

## 2020-07-05 ENCOUNTER — Ambulatory Visit: Payer: Federal, State, Local not specified - PPO | Admitting: Cardiovascular Disease

## 2020-08-16 DIAGNOSIS — H40023 Open angle with borderline findings, high risk, bilateral: Secondary | ICD-10-CM | POA: Diagnosis not present

## 2020-08-19 DIAGNOSIS — Z03818 Encounter for observation for suspected exposure to other biological agents ruled out: Secondary | ICD-10-CM | POA: Diagnosis not present

## 2020-08-19 DIAGNOSIS — Z20822 Contact with and (suspected) exposure to covid-19: Secondary | ICD-10-CM | POA: Diagnosis not present

## 2020-09-15 DIAGNOSIS — Z20822 Contact with and (suspected) exposure to covid-19: Secondary | ICD-10-CM | POA: Diagnosis not present

## 2020-09-29 DIAGNOSIS — Z7984 Long term (current) use of oral hypoglycemic drugs: Secondary | ICD-10-CM | POA: Diagnosis not present

## 2020-09-29 DIAGNOSIS — Z79899 Other long term (current) drug therapy: Secondary | ICD-10-CM | POA: Diagnosis not present

## 2020-09-29 DIAGNOSIS — E559 Vitamin D deficiency, unspecified: Secondary | ICD-10-CM | POA: Diagnosis not present

## 2020-09-29 DIAGNOSIS — Z0000 Encounter for general adult medical examination without abnormal findings: Secondary | ICD-10-CM | POA: Diagnosis not present

## 2020-09-29 DIAGNOSIS — Z23 Encounter for immunization: Secondary | ICD-10-CM | POA: Diagnosis not present

## 2020-09-29 DIAGNOSIS — E119 Type 2 diabetes mellitus without complications: Secondary | ICD-10-CM | POA: Diagnosis not present

## 2020-10-27 ENCOUNTER — Encounter: Payer: Self-pay | Admitting: Cardiovascular Disease

## 2020-11-11 ENCOUNTER — Telehealth: Payer: Federal, State, Local not specified - PPO | Admitting: Cardiovascular Disease

## 2020-11-14 DIAGNOSIS — Z79899 Other long term (current) drug therapy: Secondary | ICD-10-CM | POA: Diagnosis not present

## 2020-11-14 DIAGNOSIS — M0579 Rheumatoid arthritis with rheumatoid factor of multiple sites without organ or systems involvement: Secondary | ICD-10-CM | POA: Diagnosis not present

## 2020-11-14 DIAGNOSIS — M069 Rheumatoid arthritis, unspecified: Secondary | ICD-10-CM | POA: Diagnosis not present

## 2020-12-14 DIAGNOSIS — H40023 Open angle with borderline findings, high risk, bilateral: Secondary | ICD-10-CM | POA: Diagnosis not present

## 2020-12-20 ENCOUNTER — Encounter: Payer: Self-pay | Admitting: Cardiovascular Disease

## 2020-12-21 ENCOUNTER — Emergency Department (HOSPITAL_BASED_OUTPATIENT_CLINIC_OR_DEPARTMENT_OTHER)
Admission: EM | Admit: 2020-12-21 | Discharge: 2020-12-21 | Disposition: A | Discharge status: Home / Self Care | Payer: Federal, State, Local not specified - PPO | Attending: Emergency Medicine | Admitting: Emergency Medicine

## 2020-12-21 ENCOUNTER — Encounter (HOSPITAL_BASED_OUTPATIENT_CLINIC_OR_DEPARTMENT_OTHER): Payer: Self-pay

## 2020-12-21 ENCOUNTER — Other Ambulatory Visit: Payer: Self-pay

## 2020-12-21 DIAGNOSIS — I10 Essential (primary) hypertension: Secondary | ICD-10-CM | POA: Diagnosis not present

## 2020-12-21 DIAGNOSIS — Z7984 Long term (current) use of oral hypoglycemic drugs: Secondary | ICD-10-CM | POA: Insufficient documentation

## 2020-12-21 DIAGNOSIS — Z7982 Long term (current) use of aspirin: Secondary | ICD-10-CM | POA: Insufficient documentation

## 2020-12-21 DIAGNOSIS — R202 Paresthesia of skin: Secondary | ICD-10-CM | POA: Insufficient documentation

## 2020-12-21 DIAGNOSIS — Z79899 Other long term (current) drug therapy: Secondary | ICD-10-CM | POA: Diagnosis not present

## 2020-12-21 DIAGNOSIS — Z87891 Personal history of nicotine dependence: Secondary | ICD-10-CM | POA: Diagnosis not present

## 2020-12-21 DIAGNOSIS — R200 Anesthesia of skin: Secondary | ICD-10-CM | POA: Diagnosis not present

## 2020-12-21 DIAGNOSIS — E119 Type 2 diabetes mellitus without complications: Secondary | ICD-10-CM | POA: Diagnosis not present

## 2020-12-21 NOTE — ED Provider Notes (Signed)
[facility] EMERGENCY DEPT Provider Note   CSN: [id number] Arrival date & time: 12/21/20  4332     History Chief Complaint  Patient presents with  . Numbness    Rebekah [name] is a 65 y.o. female.  Patient presents with chief complaint of numbness and aching sensation to left upper extremity and left lower extremity that has been intermittent for the past month.  She states that she will typically get an episode 3-4 times a day, each episode lasting about 1 minute or so.  Nothing seems to bring it on.  Happens anytime throughout the day.  Sometimes it wakes her up from sleep.  She was at an outside facility yesterday in [place] and had lab work done and CT of the head done which was all unremarkable.  She was advised to follow-up with her doctor, she presents to the ER here today.  She denies any symptoms at this time as symptoms typically last 1 minute, however she is concerned that it may occur again at any time.  She currently denies any headache or chest pain or neck pain.  No abdominal pain no fever no cough no vomiting no diarrhea.        Past Medical History:  Diagnosis Date  . Arthritis   . Atrial tachycardia ([place]) 01/17/2020  . Diabetes mellitus type 2 in obese ([place]) 10/10/2017  . Essential hypertension 10/10/2017  . Hypertension   . Morbid obesity ([place]) 10/10/2017  . Pure hypercholesterolemia 10/10/2017  . Rheumatoid arteritis ([place])   . Vitamin D deficiency     Patient Active Problem List   Diagnosis Date Noted  . Atrial tachycardia ([place]) 01/17/2020  . Vitamin D deficiency 11/04/2018  . Essential hypertension 10/10/2017  . Pure hypercholesterolemia 10/10/2017  . Class 2 severe obesity with serious comorbidity and body mass index (BMI) of 35.0 to 35.9 in adult ([place]) 10/10/2017  . Diabetes mellitus type 2 in obese ([place]) 10/10/2017  . Rheumatoid arthritis involving multiple sites with positive rheumatoid factor ([place]) 11/12/2014    Past Surgical  History:  Procedure Laterality Date  . ABDOMINAL HYSTERECTOMY    . biopsy on tongue  1976  . BREAST EXCISIONAL BIOPSY Left   . CESAREAN SECTION    . TUBAL LIGATION       OB History    Gravida  3   Para  3   Term      Preterm      AB      Living        SAB      IAB      Ectopic      Multiple      Live Births              Family History  Problem Relation Age of Onset  . CAD Mother   . Cerebral aneurysm Father   . Stroke Sister   . CVA Sister   . Heart attack Maternal Grandmother   . Stroke Maternal Grandmother   . Alzheimer's disease Maternal Grandfather   . Cancer Paternal Grandmother     Social History   Tobacco Use  . Smoking status: Former Smoker    Quit date: 09/11/2010    Years since quitting: 10.2  . Smokeless tobacco: Never Used  Substance Use Topics  . Alcohol use: Yes    Comment: monthly     Home Medications Prior to Admission medications   Medication Sig Start Date End Date Taking? Authorizing Provider  amLODipine (  NORVASC) 10 MG tablet Take 1 tablet (10 mg total) by mouth daily. 01/15/20   Skeet Latch, MD  aspirin 81 MG chewable tablet Chew 81 mg by mouth daily.    [provider]  estradiol (ESTRACE) 1 MG tablet Take 1 mg by mouth daily.    [provider]  furosemide (LASIX) 40 MG tablet Take 1 tablet (40 mg total) by mouth daily. 01/15/20   Skeet Latch, MD  HYDROcodone-acetaminophen (NORCO/VICODIN) 5-325 MG tablet Take 1-2 tablets by mouth every 6 (six) hours as needed for severe pain. 04/22/16   Blanchie Dessert, MD  ibuprofen (ADVIL) 800 MG tablet Take 1 tablet (800 mg total) by mouth 3 (three) times daily. 05/27/19   Jearld Lesch A, DO  leflunomide (ARAVA) 20 MG tablet Take 20 mg by mouth daily. 01/12/16   [provider]  lubiprostone (AMITIZA) 24 MCG capsule Take 1 capsule (24 mcg total) by mouth 2 (two) times daily as needed. 05/12/19   Jearld Lesch A, DO  metFORMIN (GLUCOPHAGE) 500 MG tablet  Take 1 tablet (500 mg total) by mouth 2 (two) times daily with a meal. 12/14/19   Jearld Lesch A, DO  simvastatin (ZOCOR) 40 MG tablet TAKE 1 TABLET BY MOUTH EVERY DAY IN THE EVENING 01/29/20   Skeet Latch, MD  Vitamin D, Ergocalciferol, (DRISDOL) 1.25 MG (50000 UNIT) CAPS capsule Take 1 capsule (50,000 Units total) by mouth every 7 (seven) days. 09/23/19   Georgia Lopes, DO    Allergies    Crestor [rosuvastatin]  Review of Systems   Review of Systems  Constitutional: Negative for fever.  HENT: Negative for ear pain.   Eyes: Negative for pain.  Respiratory: Negative for cough.   Cardiovascular: Negative for chest pain.  Gastrointestinal: Negative for abdominal pain.  Genitourinary: Negative for flank pain.  Musculoskeletal: Negative for back pain.  Skin: Negative for rash.  Neurological: Negative for headaches.    Physical Exam Updated Vital Signs BP (!) 169/83   Pulse 94   Temp 98.9 F (37.2 C) (Oral)   Resp 17   LMP 08/13/1998   SpO2 100%   Physical Exam Constitutional:      General: She is not in acute distress.    Appearance: Normal appearance.  HENT:     Head: Normocephalic.     Nose: Nose normal.  Eyes:     Extraocular Movements: Extraocular movements intact.  Cardiovascular:     Rate and Rhythm: Normal rate.  Pulmonary:     Effort: Pulmonary effort is normal.  Musculoskeletal:        General: Normal range of motion.     Cervical back: Normal range of motion.     Comments: No C or T or L-spine midline step-offs or tenderness noted.  Patient able to range her head 70 degrees to the left insecurities to the right without pain or discomfort.  Intact forward flexion and extension without pain or discomfort.  Neurological:     General: No focal deficit present.     Mental Status: She is alert and oriented to person, place, and time. Mental status is at baseline.     Cranial Nerves: No cranial nerve deficit.     Sensory: No sensory deficit.     Motor: No  weakness.     Coordination: Coordination normal.     Gait: Gait normal.     Comments: Strength is 5/5 all extremities.  Gait is normal without assistance.  Radial pulses and dorsalis pedis pulses are  2+ and intact bilaterally and perfusion appears normal bilaterally upper and lower extremities.     ED Results / Procedures / Treatments   Labs (all labs ordered are listed, but only abnormal results are displayed) Labs Reviewed - No data to display  EKG None  Radiology No results found.  Procedures Procedures   Medications Ordered in ED Medications - No data to display  ED Course  I have reviewed the triage vital signs and the nursing notes.  Pertinent labs & imaging results that were available during my care of the patient were reviewed by me and considered in my medical decision making (see chart for details).    MDM Rules/Calculators/A&P                          Patient has a normal neuro exam.  No focal deficit noted.  Symptoms are very transient per patient lasting 1 minute at a time and recurrent for the past months.  She denies any neck pain history or neck pain currently.  Etiology of her transient symptoms are unclear, recommending outpatient follow-up with neurology.  We discussed work-up here, however she states that she recently had extensive work-up done yesterday in an outside ER and declines further blood tests or CT imaging of the brain.  I advised follow-up with outpatient neurology, phone number provided for the patient to call and make an appointment.  Advising immediate return for worsening or persistent symptoms.  Final Clinical Impression(s) / ED Diagnoses Final diagnoses:  Paresthesia    Rx / DC Orders ED Discharge Orders    None       Luna Fuse, MD 12/21/20 1719

## 2020-12-21 NOTE — Discharge Instructions (Signed)
Call your primary care doctor or specialist as discussed in the next 2-3 days.   Return immediately back to the ER if:  Your symptoms worsen within the next 12-24 hours. You develop new symptoms such as new fevers, persistent vomiting, new pain, shortness of breath, or new weakness or numbness, or if you have any other concerns.  

## 2020-12-21 NOTE — ED Triage Notes (Signed)
She c/o intermittent "feels like my whole left side gets numb and kinda has a charlie horse for about a month". [sic] she denies fever/cough, nor any other sign of current illness and is in no distress. She is alert and oriented x 4 with clear speech. Her granddaughter is with her.

## 2020-12-22 ENCOUNTER — Encounter: Payer: Self-pay | Admitting: Neurology

## 2020-12-23 ENCOUNTER — Ambulatory Visit: Payer: Federal, State, Local not specified - PPO | Admitting: Neurology

## 2020-12-23 ENCOUNTER — Encounter: Payer: Self-pay | Admitting: Neurology

## 2020-12-23 VITALS — BP 131/80 | HR 81 | Ht 65.0 in | Wt 217.2 lb

## 2020-12-23 DIAGNOSIS — R200 Anesthesia of skin: Secondary | ICD-10-CM

## 2020-12-23 DIAGNOSIS — R531 Weakness: Secondary | ICD-10-CM | POA: Diagnosis not present

## 2020-12-23 DIAGNOSIS — M6281 Muscle weakness (generalized): Secondary | ICD-10-CM

## 2020-12-23 DIAGNOSIS — E785 Hyperlipidemia, unspecified: Secondary | ICD-10-CM | POA: Diagnosis not present

## 2020-12-23 DIAGNOSIS — I10 Essential (primary) hypertension: Secondary | ICD-10-CM | POA: Diagnosis not present

## 2020-12-23 DIAGNOSIS — M6289 Other specified disorders of muscle: Secondary | ICD-10-CM | POA: Diagnosis not present

## 2020-12-23 DIAGNOSIS — Z131 Encounter for screening for diabetes mellitus: Secondary | ICD-10-CM | POA: Diagnosis not present

## 2020-12-23 DIAGNOSIS — R799 Abnormal finding of blood chemistry, unspecified: Secondary | ICD-10-CM | POA: Diagnosis not present

## 2020-12-23 NOTE — Progress Notes (Signed)
[facility]    Provider:  Dr Jaynee Eagles Requesting Provider: Almyra Free Greggory Brandy, MD Primary Care Provider:  Jonathon Jordan, MD  CC:  Left-sided weakness, numbness and sensory changes  HPI:  Rebekah [name] is a 65 y.o. female here as requested by [place], MD for numbness and aching sensations of the left upper extremity and left lower extremity.  Past medical history rheumatoid arthritis, morbid obesity, hypertension, diabetes, atrial tachycardia, arthritis.  I reviewed notes from the emergency room from Dec 21, 2020, she has 3-4 episodes a day, each lasting about a minute, nothing seems to bring it on, happens anytime throughout the day, sometimes it wakes her up from sleep.  She was at an outside facility in [place] [employer] and had lab work done a CT of the head was all unremarkable.  Started last year, she got a new bed and attributed it to that but when she laid down her upper thigh and butt were hurting, achy and sore, she went to [place] and on April 24th of 2022 she started having new episodes, she was in the back of a car she got up and her whole left side "went out", numbness, she couldn't even walk, it subsided in a minute, arm, leg and foot went numb, like a charlie horse, not the face. The following week April 29th she had an episode, she wa sin the loading dock, she dropped everything, she had to hold on to with numbness and weakness then resolved, on the 3rd of may she went out into the yard and it hit her, for a minute, acutely on and off, unknown triggers, she was able to sit down when it happened 5/3 after getting up out of a car that morning had 4 episodes in a row same exact on the 10th, every single time they are the same, no SOB, but some heart racing, she went to the ED in [place] and a CT scan of the head was ok. This is her whole foot turns in and her toes cramp but then it goes away always startsin the foot and radiates up the whole left side.No other  focal neurologic deficits, associated symptoms, inciting events or modifiable factors.   Reviewed notes, labs and imaging from outside physicians, which showed: see above  Review of Systems: Patient complains of symptoms per HPI as well as the following symptoms: left-sided numbness. Pertinent negatives and positives per HPI. All others negative.   Social History   Socioeconomic History  . Marital status: Married    Spouse name: Percell Miller  . Number of children: 1  . Years of education: Some college  . Highest education level: Not on file  Occupational History  . 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 Smoking status: Current Some Day Smoker    Last attempt to quit: 09/11/2010    Years since quitting: 10.2  . Smokeless tobacco: Never Used  Substance and Sexual Activity  . Alcohol use: Yes    Comment: occass  . Drug use: Never  . Sexual activity: Not on file  Other Topics Concern  . Not on file  Social History Narrative   Right handed   Caffeine use: coffee sometimes   Lives with Percell Miller (husband)   Social Determinants of Health   Financial Resource Strain: Not on file  Food Insecurity: Not on file  Transportation Needs: Not on file  Physical Activity: Not on file  Stress: Not on file  Social Connections: Not on file  Intimate Partner Violence: Not on file    Family History  Problem Relation Age of Onset  . CAD Mother   . Cerebral aneurysm Father   . Stroke Sister   . CVA Sister   . Heart attack Maternal Grandmother   . Stroke Maternal Grandmother   . Alzheimer's disease Maternal Grandfather   . Cancer Paternal Grandmother     Past Medical History:  Diagnosis Date  . Arthritis   . Atrial tachycardia ([place]) 01/17/2020  . Diabetes mellitus type 2 in obese ([place]) 10/10/2017  . Essential hypertension 10/10/2017  . Hypertension   . Morbid obesity ([place]) 10/10/2017  . Pure hypercholesterolemia 10/10/2017  . Rheumatoid arteritis ([place])   . Vitamin D  deficiency     Patient Active Problem List   Diagnosis Date Noted  . Atrial tachycardia ([place]) 01/17/2020  . Vitamin D deficiency 11/04/2018  . Essential hypertension 10/10/2017  . Pure hypercholesterolemia 10/10/2017  . Class 2 severe obesity with serious comorbidity and body mass index (BMI) of 35.0 to 35.9 in adult ([place]) 10/10/2017  . Diabetes mellitus type 2 in obese ([place]) 10/10/2017  . Rheumatoid arthritis involving multiple sites with positive rheumatoid factor ([place]) 11/12/2014    Past Surgical History:  Procedure Laterality Date  . ABDOMINAL HYSTERECTOMY    . biopsy on tongue  1976  . BREAST EXCISIONAL BIOPSY Left   . CESAREAN SECTION    . TUBAL LIGATION      Current Outpatient Medications  Medication Sig Dispense Refill  . amLODipine (NORVASC) 10 MG tablet Take 1 tablet (10 mg total) by mouth daily. 90 tablet 3  . aspirin 81 MG chewable tablet Chew 81 mg by mouth daily.    Marland Kitchen estradiol (ESTRACE) 1 MG tablet Take 1 mg by mouth daily.    . furosemide (LASIX) 40 MG tablet Take 1 tablet (40 mg total) by mouth daily. 90 tablet 1  . HYDROcodone-acetaminophen (NORCO/VICODIN) 5-325 MG tablet Take 1-2 tablets by mouth every 6 (six) hours as needed for severe pain. 10 tablet 0  . leflunomide (ARAVA) 20 MG tablet Take 20 mg by mouth daily.    Marland Kitchen lubiprostone (AMITIZA) 24 MCG capsule Take 1 capsule (24 mcg total) by mouth 2 (two) times daily as needed. 60 capsule 0  . metFORMIN (GLUCOPHAGE) 500 MG tablet Take 1 tablet (500 mg total) by mouth 2 (two) times daily with a meal. 60 tablet 0  . simvastatin (ZOCOR) 40 MG tablet TAKE 1 TABLET BY MOUTH EVERY DAY IN THE EVENING 90 tablet 3  . Vitamin D, Ergocalciferol, (DRISDOL) 1.25 MG (50000 UNIT) CAPS capsule Take 1 capsule (50,000 Units total) by mouth every 7 (seven) days. (Patient not taking: Reported on 12/23/2020) 4 capsule 0   No current facility-administered medications for this visit.    Allergies as of 12/23/2020 - Review Complete  12/23/2020  Allergen Reaction Noted  . Crestor [rosuvastatin] Other (See Comments) 10/08/2017    Vitals: BP 131/80 (BP Location: Left Arm, Patient Position: Sitting, Cuff Size: Large)   Pulse 81   Ht 5\' 5"  (1.651 m)   Wt 217 lb 3.2 oz (98.5 kg)   LMP 08/13/1998   SpO2 98%   BMI 36.14 kg/m  Last Weight:  Wt Readings from Last 1 Encounters:  12/23/20 217 lb 3.2 oz (98.5 kg)   Last Height:   Ht Readings from Last 1 Encounters:  12/23/20 5\' 5"  (1.651 m)     Physical exam: Exam: Gen: NAD, conversant, well nourised, obese, well groomed  CV: RRR, no MRG. No Carotid Bruits. No peripheral edema, warm, nontender Eyes: Conjunctivae clear without exudates or hemorrhage  Neuro: Detailed Neurologic Exam  Speech:    Speech is normal; fluent and spontaneous with normal comprehension.  Cognition:    The patient is oriented to person, place, and time;     recent and remote memory intact;     language fluent;     normal attention, concentration,     fund of knowledge Cranial Nerves:    The pupils are equal, round, and reactive to light. The fundi areflat. Visual fields are full to finger confrontation. Extraocular movements are intact. Trigeminal sensation is intact and the muscles of mastication are normal. Left eye ptosis otherwise  face is symmetric. The palate elevates in the midline. Hearing intact. Voice is normal. Shoulder shrug is normal. The tongue has normal motion without fasciculations.   Coordination:    Normal finger to nose   Gait:    Heel-toe and tandem gait are normal.   Motor Observation:    No asymmetry, no atrophy, and no involuntary movements noted. Tone:    Normal muscle tone.    Posture:    Posture is normal. normal erect    Strength:    Mild left-sided weakness     Sensation: dyesthesias left side     Reflex Exam:  DTR's:    Deep tendon reflexes in the upper and lower extremities are normal bilaterally.   Toes:    The toes  are downgoing bilaterally.   Clonus:    Clonus is absent.    Assessment/Plan:  65 y.o. female here as requested by [place], MD for numbness and weakness of the left upper extremity and left lower extremity.  Past medical history rheumatoid arthritis, morbid obesity, hypertension, diabetes, atrial tachycardia, arthritis.  Patient is having episodes of numbness and weakness left arm and left leg, today on exam she does have left arm more than leg weakness and dysesthesias on sensory testing.  I am  concerned for stuttering stroke versus seizure activity.  I did advise patient that should she have another episode she may consider calling 911 as the only place we can get a thorough evaluation including MRI of the brain is to go to the emergency room and be admitted.  I am going to order an MRI of the brain and an MRA of the head and an EEG. Continue Aspirin.   Orders Placed This Encounter  Procedures  . MR BRAIN W WO CONTRAST  . MR ANGIO HEAD WO CONTRAST  . CBC with Differential/Platelets  . Comprehensive metabolic panel  . Hemoglobin A1c  . Lipid panel  . EEG adult   No orders of the defined types were placed in this encounter.   Cc: [place], Greggory Brandy, MD,  Jonathon Jordan, MD  Sarina Ill, MD  [facility] [address] [place], [place] [address]  Phone [phone] Fax [phone]

## 2020-12-23 NOTE — Patient Instructions (Addendum)
Patient is having episodes of numbness and weakness left arm and left leg, today on exam she does have left arm more than leg weakness and dysesthesias on sensory testing.  I am  concerned for stuttering stroke versus seizure activity.  I did advise patient that should she have another episode she may consider calling 911 as the only place we can get a thorough evaluation including MRI of the brain is to go to the emergency room and be admitted.  I am going to order an MRI of the brain and an MRA of the head and an EEG. Continue Aspirin.    Ischemic Stroke  An ischemic stroke (cerebrovascular accident, or CVA) is the sudden death of brain tissue that occurs when an area of the brain does not get enough blood flow. This condition is a medical emergency that must be treated right away. An ischemic stroke can cause permanent loss of brain function. Losing brain function can cause problems with how different parts of the body work. What are the causes? This condition is caused by a decrease of blood flow to an area of the brain, which may be the result of:  A small blood clot (embolus) or a buildup of plaque in the blood vessels, called atherosclerosis, that blocks blood flow in the brain.  An abnormal heart rhythm called atrial fibrillation, which sends a small blood clot to the brain.  A blocked or damaged artery in the head or neck.  Certain infections.  Inflammation of the arteries in the brain (vasculitis). Sometimes, the cause of ischemic stroke is not known. What increases the risk? The following factors may make you more likely to develop this condition: Factors that you can change  High blood pressure (hypertension) or certain other medical conditions, such as: ? Heart disease. ? Diabetes mellitus. ? High cholesterol. ? Obesity. ? Sleep apnea. ? Migraine headache.  Smoking cigarettes or using other tobacco products.  Physical inactivity.  Heavy alcohol use.  Use of illegal  drugs, especially cocaine and methamphetamine.  Taking birth control pills, especially if you also use tobacco. Factors that you cannot change  Being older than age 63.  History of blood clots, stroke, or mini-stroke (transient ischemic attack, TIA).  History of high blood pressure when pregnant (preeclampsia), in women.  Family history of stroke.  Sickle cell disease.  Blood clotting disorders (hypercoagulable state). What are the signs or symptoms? Symptoms of this condition usually develop suddenly, or you may notice them after waking from sleep. These sudden symptoms may include:  Weakness or numbness of your face, arm, or leg, especially on one side of your body.  Loss of balance or coordination.  Slurred speech, or aphasia. Aphasia is trouble speaking or trouble understanding speech or both.  Vision changes in one or both eyes. You may have double vision, blurred vision, or loss of vision.  Dizziness or confusion.  Nausea and vomiting.  Severe headache with no known cause. If possible, write down the exact time that you last felt like your normal self and what time your symptoms started. Tell your health care provider. If symptoms come and go, they could be signs of a TIA (transient ischemic attack). Get help right away, even if you feel better. How is this diagnosed? This condition may be diagnosed based on:  Your symptoms, your medical history, and a physical exam.  CT scan of the brain.  MRI.  Imaging tests that scan blood flow (circulation) in the brain. These may be  CT angiogram, MRI angiogram, or cerebral angiogram. You may need to see a health care provider who specializes in stroke care. A stroke specialist can be seen in person or through communication using telephone or television technology (telemedicine). You may also have other tests, including:  Electrocardiogram (ECG).  Continuous heart monitoring.  Transthoracic echocardiogram  (TTE).  Transesophageal echocardiogram (TEE).  Carotid ultrasound.  Blood tests.  Sleep study to check for sleep apnea. How is this treated? Treatment for this condition depends on the duration, severity, and cause of your symptoms and on the area of the brain affected. It is very important to get treatment at the first sign of stroke symptoms. Some treatments work better if they are done within 3-6 hours of the start of stroke symptoms. These initial treatments may include:  Thrombolytic medicine that is injected to dissolve the blood clot.  Treatments given directly to the affected artery to remove or dissolve the blood clot.  Medicines to control blood pressure.  Anticoagulant or antiplatelet medicines to thin the blood. Other treatments may include:  Oxygen.  IV fluids.  Procedures to increase blood flow. Medicines and diet changes may be used to help manage risk factors for stroke, such as diabetes, high cholesterol, and high blood pressure. After a stroke, you may work with physical, speech, mental health, or occupational therapists to help you recover. Follow these instructions at home: Medicines  Take over-the-counter and prescription medicines only as told by your health care provider.  If you were told to take a medicine to thin your blood, take your medicine exactly as told, at the same time every day. This includes aspirin or an anticoagulant. ? Taking too much blood-thinning medicine can cause bleeding. ? If you do not take enough blood-thinning medicine, you will not be protected enough against another stroke and other problems.  Understand the side effects of taking anticoagulant medicine. When taking this type of medicine, make sure you: ? Hold pressure over any cuts for longer than usual. ? Tell your dentist and other health care providers that you are taking anticoagulants before you have any procedures that may cause bleeding. ? Avoid activities that could  cause injury or bruising. ? Wear a medical alert bracelet or carry a card that lists what medicines you take. Eating and drinking  Follow instructions from your health care provider about diet.  Eat healthy foods.  If your stroke affected your ability to swallow, you may need to take steps to avoid choking. These may include taking small bites when eating and eating foods that are soft or pureed. Safety  Follow instructions from your health care team about physical activity.  Use a walker or cane as told by your health care provider.  Take steps to create a safe home environment to lower your risk of falls. These steps may include: ? Having your home looked at by specialists. ? Installing grab bars in the bedroom and bathroom. ? Using safety equipment, such as raised toilets and a seat in the shower. General instructions  Do not use any products that contain nicotine or tobacco, such as cigarettes, e-cigarettes, and chewing tobacco. If you need help quitting, ask your health care provider.  If you drink alcohol: ? Limit how much you use to:  0-1 drink a day for women.  0-2 drinks a day for men. ? Be aware of how much alcohol is in your drink. In the [place] one drink equals one 12 oz bottle of beer (355 mL), one  5 oz glass of wine (148 mL), or one 1 oz glass of hard liquor (44 mL).  If you need help to stop using drugs or alcohol, ask your health care provider about a referral to a program or specialist.  Maintain an active and healthy lifestyle. Get regular exercise as told.  Wear a medical bracelet as told by your health care provider.  Keep all follow-up visits as told by your health care provider, including visits with all specialists on your health care team. This is important. How is this prevented? You can lower your risk of another stroke by managing high blood pressure, high cholesterol, diabetes, heart disease, sleep apnea, and obesity. Your risk can also be lowered by  quitting smoking, limiting alcohol, and staying physically active. Your health care provider will continue to help you with ways to prevent short-term and long-term problems caused by stroke. Get help right away if: You have any symptoms of a stroke. "BE FAST" is an easy way to remember the main warning signs of a stroke:  B - Balance. Signs are dizziness, sudden trouble walking, or loss of balance.  E - Eyes. Signs are trouble seeing or a sudden change in vision.  F - Face. Signs are sudden weakness or numbness of the face, or the face or eyelid drooping on one side.  A - Arms. Signs are weakness or numbness in an arm. This happens suddenly and usually on one side of the body.  S - Speech. Signs are sudden trouble speaking, slurred speech, or trouble understanding what people say.  T - Time. Time to call emergency services. Write down what time symptoms started. You have other signs of a stroke, such as:  A sudden, severe headache with no known cause.  Nausea or vomiting.  Seizure. These symptoms may represent a serious problem that is an emergency. Do not wait to see if the symptoms will go away. Get medical help right away. Call your local emergency services (911 in the [place] Do not drive yourself to the hospital.   Summary  An ischemic stroke (cerebrovascular accident, or CVA) is the sudden death of brain tissue that occurs when an area of the brain does not get enough blood flow.  Symptoms of this condition usually develop suddenly, or you may notice them after waking from sleep.  It is very important to get treatment at the first sign of stroke symptoms. Stroke is a medical emergency that must be treated right away. This information is not intended to replace advice given to you by your health care provider. Make sure you discuss any questions you have with your health care provider. Document Revised: 07/27/2019 Document Reviewed: 07/27/2019 Elsevier Patient Education  2021  [employer].

## 2020-12-24 LAB — CBC WITH DIFFERENTIAL/PLATELET
Basophils Absolute: 0 10*3/uL (ref 0.0–0.2)
Basos: 1 %
EOS (ABSOLUTE): 0.2 10*3/uL (ref 0.0–0.4)
Eos: 4 %
Hematocrit: 41.2 % (ref 34.0–46.6)
Hemoglobin: 13.5 g/dL (ref 11.1–15.9)
Immature Grans (Abs): 0 10*3/uL (ref 0.0–0.1)
Immature Granulocytes: 0 %
Lymphocytes Absolute: 1.7 10*3/uL (ref 0.7–3.1)
Lymphs: 39 %
MCH: 29.4 pg (ref 26.6–33.0)
MCHC: 32.8 g/dL (ref 31.5–35.7)
MCV: 90 fL (ref 79–97)
Monocytes Absolute: 0.4 10*3/uL (ref 0.1–0.9)
Monocytes: 9 %
Neutrophils Absolute: 2 10*3/uL (ref 1.4–7.0)
Neutrophils: 47 %
Platelets: 242 10*3/uL (ref 150–450)
RBC: 4.59 x10E6/uL (ref 3.77–5.28)
RDW: 12.8 % (ref 11.7–15.4)
WBC: 4.2 10*3/uL (ref 3.4–10.8)

## 2020-12-24 LAB — LIPID PANEL
Chol/HDL Ratio: 2.3 ratio (ref 0.0–4.4)
Cholesterol, Total: 172 mg/dL (ref 100–199)
HDL: 75 mg/dL (ref >39)
LDL Chol Calc (NIH): 84 mg/dL (ref 0–99)
Triglycerides: 68 mg/dL (ref 0–149)
VLDL Cholesterol Cal: 13 mg/dL (ref 5–40)

## 2020-12-24 LAB — COMPREHENSIVE METABOLIC PANEL
ALT: 15 IU/L (ref 0–32)
AST: 14 IU/L (ref 0–40)
Albumin/Globulin Ratio: 1.2 (ref 1.2–2.2)
Albumin: 4.4 g/dL (ref 3.8–4.8)
Alkaline Phosphatase: 107 IU/L (ref 44–121)
BUN/Creatinine Ratio: 14 (ref 12–28)
BUN: 11 mg/dL (ref 8–27)
Bilirubin Total: 0.4 mg/dL (ref 0.0–1.2)
CO2: 24 mmol/L (ref 20–29)
Calcium: 9.9 mg/dL (ref 8.7–10.3)
Chloride: 103 mmol/L (ref 96–106)
Creatinine, Ser: 0.76 mg/dL (ref 0.57–1.00)
Globulin, Total: 3.8 g/dL (ref 1.5–4.5)
Glucose: 119 mg/dL — ABNORMAL HIGH (ref 65–99)
Potassium: 4.1 mmol/L (ref 3.5–5.2)
Sodium: 141 mmol/L (ref 134–144)
Total Protein: 8.2 g/dL (ref 6.0–8.5)
eGFR: 87 mL/min/{1.73_m2} (ref >59)

## 2020-12-24 LAB — HEMOGLOBIN A1C
Est. average glucose Bld gHb Est-mCnc: 140 mg/dL
Hgb A1c MFr Bld: 6.5 % — ABNORMAL HIGH (ref 4.8–5.6)

## 2020-12-27 ENCOUNTER — Telehealth: Payer: Self-pay | Admitting: Neurology

## 2020-12-27 NOTE — Telephone Encounter (Signed)
MRI/MRA: [facility] via [place] website, sent to GI for scheduling

## 2021-01-01 ENCOUNTER — Other Ambulatory Visit: Payer: Self-pay

## 2021-01-01 ENCOUNTER — Ambulatory Visit
Admission: RE | Admit: 2021-01-01 | Discharge: 2021-01-01 | Disposition: A | Discharge status: Home / Self Care | Payer: Federal, State, Local not specified - PPO | Source: Ambulatory Visit | Attending: Neurology | Admitting: Neurology

## 2021-01-01 DIAGNOSIS — R200 Anesthesia of skin: Secondary | ICD-10-CM | POA: Diagnosis not present

## 2021-01-01 DIAGNOSIS — R531 Weakness: Secondary | ICD-10-CM | POA: Diagnosis not present

## 2021-01-01 MED ORDER — GADOBENATE DIMEGLUMINE 529 MG/ML IV SOLN
20.0000 mL | Freq: Once | INTRAVENOUS | Status: AC | PRN
  Administered 2021-01-01: 20 mL via INTRAVENOUS

## 2021-01-03 ENCOUNTER — Other Ambulatory Visit: Payer: Federal, State, Local not specified - PPO

## 2021-01-04 ENCOUNTER — Telehealth: Payer: Self-pay | Admitting: *Deleted

## 2021-01-04 NOTE — Telephone Encounter (Signed)
Spoke with patient and discussed MRI brain results. Pt was not aware she had ever had a stroke. She agreed to be seen earlier. We scheduled her for Monday June 20th at 8:30 AM.  Her questions were answered. She saw the results on mychart as well. She denies any issues with her left ear. Pt verbalized appreciation for the call.

## 2021-01-04 NOTE — Telephone Encounter (Signed)
-----   Message from Melvenia Beam, MD sent at 01/03/2021 12:47 PM EDT ----- Rebekah [name]: Will you call and discuss and see if we can move her appointment up sooner maybe next month sometime? thanks  The MRi of your brain did not show anything new. There are old strokes. I would lie to discuss and review with you at your next appointment with me. Continue aspirin and I would like to get you seen sooner than august maybe next month sometime? thanks

## 2021-01-05 ENCOUNTER — Other Ambulatory Visit: Payer: Self-pay

## 2021-01-05 ENCOUNTER — Ambulatory Visit: Payer: Federal, State, Local not specified - PPO | Admitting: Neurology

## 2021-01-05 DIAGNOSIS — R200 Anesthesia of skin: Secondary | ICD-10-CM | POA: Insufficient documentation

## 2021-01-05 DIAGNOSIS — R252 Cramp and spasm: Secondary | ICD-10-CM | POA: Diagnosis not present

## 2021-01-05 NOTE — Procedures (Signed)
    History:  Rebekah [name] is a 65 year old patient with history of numbness and achy sensations involving the left upper and left lower extremities.  The patient may have recurring episodes that may be associated with some weakness.  The patient is being evaluated for the above events.  This is a routine EEG.  No skull defects noted.  Medications include Norvasc, aspirin, Estrace, Lasix, hydrocodone, Arava, Amitiza, Glucophage, Zocor, and vitamin D.  EEG classification: Normal awake  Description of the recording: The background rhythms of this recording consists of a fairly well modulated medium amplitude alpha rhythm of 10 Hz that is reactive to eye opening and closure. As the record progresses, the patient appears to remain in the waking state throughout the recording. Photic stimulation was performed, resulting in a bilateral and symmetric photic driving response. Hyperventilation was also performed, resulting in a minimal buildup of the background rhythm activities without significant slowing seen. At no time during the recording does there appear to be evidence of spike or spike wave discharges or evidence of focal slowing. EKG monitor shows no evidence of cardiac rhythm abnormalities with a heart rate of 78.  Impression: This is a normal EEG recording in the waking state. No evidence of ictal or interictal discharges are seen.

## 2021-01-06 ENCOUNTER — Other Ambulatory Visit: Payer: Self-pay | Admitting: Neurology

## 2021-01-06 DIAGNOSIS — R200 Anesthesia of skin: Secondary | ICD-10-CM

## 2021-01-06 DIAGNOSIS — R270 Ataxia, unspecified: Secondary | ICD-10-CM

## 2021-01-06 DIAGNOSIS — R29898 Other symptoms and signs involving the musculoskeletal system: Secondary | ICD-10-CM

## 2021-01-06 MED ORDER — GABAPENTIN 300 MG PO CAPS: 300.0000 mg | ORAL_CAPSULE | Freq: Three times a day (TID) | ORAL | 11 refills | Status: DC

## 2021-01-10 ENCOUNTER — Telehealth: Payer: Self-pay | Admitting: Neurology

## 2021-01-10 NOTE — Telephone Encounter (Signed)
MR cervical spine wo contrast 30 mins dr. Jaynee Eagles no to covid questions aware to arrive at 7:00 [facility]: npr via [facility] website  Scheduled at [facility] for 01/11/21

## 2021-01-11 ENCOUNTER — Other Ambulatory Visit: Payer: Self-pay | Admitting: Neurology

## 2021-01-11 ENCOUNTER — Other Ambulatory Visit: Payer: Self-pay

## 2021-01-11 ENCOUNTER — Ambulatory Visit (INDEPENDENT_AMBULATORY_CARE_PROVIDER_SITE_OTHER): Payer: Federal, State, Local not specified - PPO

## 2021-01-11 DIAGNOSIS — R270 Ataxia, unspecified: Secondary | ICD-10-CM | POA: Diagnosis not present

## 2021-01-11 DIAGNOSIS — R200 Anesthesia of skin: Secondary | ICD-10-CM

## 2021-01-11 DIAGNOSIS — R29898 Other symptoms and signs involving the musculoskeletal system: Secondary | ICD-10-CM | POA: Diagnosis not present

## 2021-01-11 MED ORDER — PREGABALIN 100 MG PO CAPS: 100.0000 mg | ORAL_CAPSULE | Freq: Two times a day (BID) | ORAL | 6 refills | Status: DC

## 2021-01-12 NOTE — Telephone Encounter (Signed)
Spoke with patient.  She reports she has been experiencing itching with the gabapentin and has a rash on her left arm, neck, and chest.  She denies any problems with swelling or trouble breathing.  She does say however that otherwise she is tolerating the medication well and has not had any episodes since Sunday.  Also helps her pain.  She has been walking especially whenever she thinks that episodes may be coming.  She has been walking 2 miles since Tuesday.  She is concerned about experiencing drowsiness and weight gain with Lyrica.  Patient states so far she is good with what she has.  She states her "bonus daughter" has a similar condition and has been on medications and that is why she wondered [facility] and amitriptyline as potential treatments for her condition.  She would like Dr. Cathren Laine opinion on this.  She did not realize metaxalone was a muscle relaxant so she has retracted her question about that medication.  I let the patient know a message would be sent to Dr. Jaynee Eagles and she would either receive a direct response via MyChart or I will call her back.  Patient verbalized appreciation.

## 2021-01-16 ENCOUNTER — Telehealth: Payer: Self-pay | Admitting: *Deleted

## 2021-01-16 NOTE — Telephone Encounter (Signed)
Late entry from 01/12/2021: Referral form for 48 hour EEG completed, signed, and faxed to [facility] along with supporting documentation. Received a receipt of confirmation.

## 2021-01-17 ENCOUNTER — Telehealth: Payer: Self-pay | Admitting: *Deleted

## 2021-01-17 NOTE — Telephone Encounter (Signed)
-----   Message from Melvenia Beam, MD sent at 01/13/2021 11:45 AM EDT ----- Romelle Starcher; there is subtle signal in the cervical spine. We should repeat the MRI of the cervical spine with contrast in a month and in the meantime order a full workup. Looks like I am seeing patient on June 20th and can discuss with her at that time thanks
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
## 2021-01-30 ENCOUNTER — Ambulatory Visit: Payer: Federal, State, Local not specified - PPO | Admitting: Neurology

## 2021-01-30 ENCOUNTER — Encounter: Payer: Self-pay | Admitting: Neurology

## 2021-02-06 ENCOUNTER — Ambulatory Visit: Payer: Federal, State, Local not specified - PPO | Admitting: Neurology

## 2021-02-09 NOTE — Telephone Encounter (Signed)
I called the patient and LVM (ok per DPR) notifying patient of 48 hour EEG results as noted below (WNL and event of sharp pains on left side of head did not have any eeg correlates).  Left office number for patient to call back if she has any questions.

## 2021-02-09 NOTE — Telephone Encounter (Signed)
Received 48-hour ambulatory EEG results from [facility]. Copy of results sent to medical records for scanning.   Physician conclusion/impression:  This 48-hour awake and asleep video ambulatory EEG was within normal limits.  No focal, lateralized or epileptiform features were noted.   Events: The patient logged 1 event and there were 0 "patient event" button pushes noted.  Event #1- 01/27/2021 at 11:30.  Button not pushed.  Patient logged "sharp pains on left side of brain"; the patient is seen on and off camera, walking around the room.  No clinical EEG correlates observed.  Impression: A normal electroencephalogram in and of itself, does not rule out the clinical diagnosis of seizure disorder. Clinical correlation recommended.

## 2021-02-14 DIAGNOSIS — J069 Acute upper respiratory infection, unspecified: Secondary | ICD-10-CM | POA: Diagnosis not present

## 2021-02-14 DIAGNOSIS — I10 Essential (primary) hypertension: Secondary | ICD-10-CM | POA: Diagnosis not present

## 2021-02-23 ENCOUNTER — Ambulatory Visit (HOSPITAL_BASED_OUTPATIENT_CLINIC_OR_DEPARTMENT_OTHER): Payer: Federal, State, Local not specified - PPO | Admitting: Cardiovascular Disease

## 2021-03-10 ENCOUNTER — Telehealth: Payer: Self-pay | Admitting: Neurology

## 2021-03-10 MED ORDER — PREGABALIN 100 MG PO CAPS: 100.0000 mg | ORAL_CAPSULE | Freq: Two times a day (BID) | ORAL | 0 refills | Status: DC

## 2021-03-10 NOTE — Telephone Encounter (Signed)
Pt called, in [place] and do not have enough pregabalin (LYRICA) 100 MG capsule to last until Sunday. Asking if you can send a prescription for 6 tablets to:  [facility]  [address] [place] [address] Phone # [phone]

## 2021-03-10 NOTE — Telephone Encounter (Signed)
Bridge Rx sent to pharmacy in [place] as requested.

## 2021-03-19 ENCOUNTER — Other Ambulatory Visit: Payer: Self-pay | Admitting: Cardiovascular Disease

## 2021-03-23 DIAGNOSIS — R0981 Nasal congestion: Secondary | ICD-10-CM | POA: Diagnosis not present

## 2021-03-23 DIAGNOSIS — R051 Acute cough: Secondary | ICD-10-CM | POA: Diagnosis not present

## 2021-03-23 DIAGNOSIS — U071 COVID-19: Secondary | ICD-10-CM | POA: Diagnosis not present

## 2021-03-23 DIAGNOSIS — J302 Other seasonal allergic rhinitis: Secondary | ICD-10-CM | POA: Diagnosis not present

## 2021-03-23 DIAGNOSIS — R059 Cough, unspecified: Secondary | ICD-10-CM | POA: Diagnosis not present

## 2021-03-26 DIAGNOSIS — Z20822 Contact with and (suspected) exposure to covid-19: Secondary | ICD-10-CM | POA: Diagnosis not present

## 2021-03-28 DIAGNOSIS — R059 Cough, unspecified: Secondary | ICD-10-CM | POA: Diagnosis not present

## 2021-03-28 DIAGNOSIS — Z23 Encounter for immunization: Secondary | ICD-10-CM | POA: Diagnosis not present

## 2021-04-05 ENCOUNTER — Ambulatory Visit: Payer: Federal, State, Local not specified - PPO | Admitting: Neurology

## 2021-04-10 ENCOUNTER — Other Ambulatory Visit: Payer: Self-pay | Admitting: Cardiovascular Disease

## 2021-04-11 DIAGNOSIS — R053 Chronic cough: Secondary | ICD-10-CM | POA: Diagnosis not present

## 2021-04-12 ENCOUNTER — Encounter (HOSPITAL_BASED_OUTPATIENT_CLINIC_OR_DEPARTMENT_OTHER): Payer: Self-pay | Admitting: Cardiovascular Disease

## 2021-04-12 ENCOUNTER — Ambulatory Visit (HOSPITAL_BASED_OUTPATIENT_CLINIC_OR_DEPARTMENT_OTHER): Payer: Federal, State, Local not specified - PPO | Admitting: Cardiovascular Disease

## 2021-04-12 ENCOUNTER — Other Ambulatory Visit: Payer: Self-pay

## 2021-04-12 ENCOUNTER — Other Ambulatory Visit: Payer: Self-pay | Admitting: Cardiovascular Disease

## 2021-04-12 VITALS — BP 122/66 | HR 78 | Ht 66.0 in | Wt 219.8 lb

## 2021-04-12 DIAGNOSIS — Z5181 Encounter for therapeutic drug level monitoring: Secondary | ICD-10-CM

## 2021-04-12 DIAGNOSIS — I10 Essential (primary) hypertension: Secondary | ICD-10-CM | POA: Diagnosis not present

## 2021-04-12 MED ORDER — LOSARTAN POTASSIUM 50 MG PO TABS: 50.0000 mg | ORAL_TABLET | Freq: Every day | ORAL | 3 refills | Status: DC

## 2021-04-12 NOTE — Patient Instructions (Signed)
Medication Instructions:  STOP LISINOPRIL   START LOSARTAN 50 MG DAILY   *If you need a refill on your cardiac medications before your next appointment, please call your pharmacy*  Lab Work: BMET IN 1 WEEK   If you have labs (blood work) drawn today and your tests are completely normal, you will receive your results only by: [place] (if you have MyChart) OR A paper copy in the mail If you have any lab test that is abnormal or we need to change your treatment, we will call you to review the results.  Testing/Procedures: NONE  Follow-Up: At [facility], you and your health needs are our priority.  As part of our continuing mission to provide you with exceptional heart care, we have created designated Provider Care Teams.  These Care Teams include your primary Cardiologist (physician) and Advanced Practice Providers (APPs -  Physician Assistants and Nurse Practitioners) who all work together to provide you with the care you need, when you need it.  We recommend signing up for the patient portal called "MyChart".  Sign up information is provided on this After Visit Summary.  MyChart is used to connect with patients for Virtual Visits (Telemedicine).  Patients are able to view lab/test results, encounter notes, upcoming appointments, etc.  Non-urgent messages can be sent to your provider as well.   To learn more about what you can do with MyChart, go to [email or web address].    Your next appointment:   3 month(s)  The format for your next appointment:   In Person  Provider:   Skeet Latch, MD or Laurann Montana, NP  Other Instructions  MONITOR YOU BLOOD PRESSURE AT HOME, CALL THE OFFICE IF IT IS NOT MAINTAINING

## 2021-04-12 NOTE — Telephone Encounter (Signed)
Rx(s) sent to pharmacy electronically.  

## 2021-04-12 NOTE — Progress Notes (Signed)
Rebekah [name] by   Cardiology Office Note    Date:  04/12/2021   ID:  SHIVIKA ENGELHARD, DOB April 19, 1956, MRN [id number]  PCP:  Jonathon Jordan, MD  Cardiologist:  Skeet Latch, MD  Electrophysiologist:  None   Evaluation Performed:  Follow-Up Visit  Chief Complaint:  hypertension  History of Present Illness:    Rebekah [name] is a 64 y.o. female with hypertension, hyperlipidemia, TIA, Rheumatoid arthritis and diabetes here for follow up.  She was initially seen 09/2017 for the evaluation of chest pain.  She presented to urgent care 05/2017 with chest pain.  At the time she reported exertional chest pain that occurred while walking on the treadmill or walking for exercise.  The episodes lasted for several hours at a time.  She felt as though her at the center of her chest hurt when she tried to take a deep breath. In the ED her EKG revealed LVH with repolarization abnormalities versus lateral ST elevations.  Cardiac enzymes were negative.  D-dimer was mildly elevated and she had recently flown across the country.  She had a chest CT that was negative for pulmonary embolism.  She followed up with Dr. Stephanie Acre and was referred to cardiology given her risk factors.  She had an ETT 10/2017 that was negative for ischemia.  At her last appointment her blood pressure was stable but she was volume overloaded.  She was taking both HCTZ and Lasix.  Hydrochlorothiazide was discontinued and Lasix was increased.  Amlodipine was also increased to make sure her blood pressure stayed well have been controlled.   She has been experiencing recurrent episodes of L sided numbness. It happens sporadically and lasts for a few minutes.  She has seen neurology and was started on pregabalin and hasn't had any more episodes.  She has otherwise been doing well.  She saw her PCP and was started on lisinopril.  Her blood pressure has been somewhat labile.  Today it is good but yesterday it was 140s over 70s.  Since late presenting  for a was started she has developed a non-productive cough.  It has more commonly been elevated afternoon.  She has not been getting much exercise.  She has been working from home for the last 2 years and starts going back to the office next week.  She denies any chest pain or shortness of breath.  She denies lower extremity edema, orthopnea, or PND.   Past Medical History:  Diagnosis Date   Arthritis    Atrial tachycardia ([place]) 01/17/2020   Diabetes mellitus type 2 in obese ([place]) 10/10/2017   Essential hypertension 10/10/2017   Hypertension    Morbid obesity ([place]) 10/10/2017   Pure hypercholesterolemia 10/10/2017   Rheumatoid arteritis ([place])    Vitamin D deficiency    Past Surgical History:  Procedure Laterality Date   ABDOMINAL HYSTERECTOMY     biopsy on tongue  1976   BREAST EXCISIONAL BIOPSY Left    CESAREAN SECTION     TUBAL LIGATION       Current Meds  Medication Sig   albuterol (VENTOLIN HFA) 108 (90 Base) MCG/ACT inhaler Inhale 2 puffs into the lungs every 4 (four) hours.   amLODipine (NORVASC) 10 MG tablet TAKE 1 TABLET BY MOUTH EVERY DAY   aspirin 81 MG chewable tablet Chew 81 mg by mouth daily.   cetirizine (ZYRTEC) 10 MG tablet Take 10 mg by mouth daily.   Cholecalciferol 25 MCG (1000 UT) capsule cholecalciferol (vitamin D3) 25 mcg (1,000  unit) capsule  TAKE ONE CAPSULE BY MOUTH EVERY DAY   estradiol (ESTRACE) 1 MG tablet Take 1 mg by mouth daily.   EUCRISA 2 % OINT Apply 1 application topically 2 (two) times daily.   fluticasone (FLONASE) 50 MCG/ACT nasal spray Place into both nostrils.   furosemide (LASIX) 40 MG tablet Take 1 tablet (40 mg total) by mouth daily.   HYDROcodone-acetaminophen (NORCO/VICODIN) 5-325 MG tablet Take 1-2 tablets by mouth every 6 (six) hours as needed for severe pain.   hydrOXYzine (ATARAX/VISTARIL) 25 MG tablet Take 1 tablet by mouth every 6 (six) hours as needed.   leflunomide (ARAVA) 20 MG tablet Take 20 mg by mouth daily.   losartan (COZAAR)  50 MG tablet Take 1 tablet (50 mg total) by mouth daily.   lubiprostone (AMITIZA) 24 MCG capsule Take 1 capsule (24 mcg total) by mouth 2 (two) times daily as needed.   metFORMIN (GLUCOPHAGE) 500 MG tablet Take 1 tablet (500 mg total) by mouth 2 (two) times daily with a meal.   omeprazole (PRILOSEC) 40 MG capsule Take 40 mg by mouth daily.   pregabalin (LYRICA) 100 MG capsule Take 1 capsule (100 mg total) by mouth 2 (two) times daily.   simvastatin (ZOCOR) 40 MG tablet TAKE 1 TABLET BY MOUTH EVERY DAY IN THE EVENING   Vitamin D, Ergocalciferol, (DRISDOL) 1.25 MG (50000 UNIT) CAPS capsule Take 1 capsule (50,000 Units total) by mouth every 7 (seven) days.   [DISCONTINUED] lisinopril (ZESTRIL) 5 MG tablet TAKE 1 TABLET BY MOUTH EVERY DAY FOR 90 DAYS     Allergies:   Lisinopril and Crestor [rosuvastatin]   Social History   Tobacco Use   Smoking status: Some Days    Types: Cigarettes    Last attempt to quit: 09/11/2010    Years since quitting: 10.5   Smokeless tobacco: Never  Substance Use Topics   Alcohol use: Yes    Comment: occass   Drug use: Never     Family Hx: The patient's family history includes Alzheimer's disease in her maternal grandfather; CAD in her mother; CVA in her sister; Cancer in her paternal grandmother; Cerebral aneurysm in her father; Heart attack in her maternal grandmother; Stroke in her maternal grandmother and sister.  ROS:   Please see the history of present illness.     All other systems reviewed and are negative.   Prior CV studies:   The following studies were reviewed today:  ETT 10/2017: There was no ST segment deviation noted during stress. 64mn, 41sec - good exercise time Low risk exercise treadmill test with no electrocardiographic evidence of ischemia. Normal  Labs/Other Tests and Data Reviewed:    EKG:  An ECG dated 01/15/2020 was personally reviewed today and demonstrated:  Sinus rhythm.  Rate 73 bpm.   01/15/20: Atrial tachycardia.  3: 1 AV  conduction.  LAD.  LVH with secondary repolarization abnormalities  Recent Labs: 12/23/2020: ALT 15; BUN 11; Creatinine, Ser 0.76; Hemoglobin 13.5; Platelets 242; Potassium 4.1; Sodium 141   Recent Lipid Panel Lab Results  Component Value Date/Time   CHOL 172 12/23/2020 10:18 AM   TRIG 68 12/23/2020 10:18 AM   HDL 75 12/23/2020 10:18 AM   CHOLHDL 2.3 12/23/2020 10:18 AM   LDLCALC 84 12/23/2020 10:18 AM    Wt Readings from Last 3 Encounters:  04/12/21 219 lb 12.8 oz (99.7 kg)  12/23/20 217 lb 3.2 oz (98.5 kg)  01/15/20 217 lb 3.2 oz (98.5 kg)     Objective:  VS:  BP 122/66   Pulse 78   Ht '5\' 6"'$  (1.676 m)   Wt 219 lb 12.8 oz (99.7 kg)   LMP 08/13/1998   SpO2 98%   BMI 35.48 kg/m  , BMI Body mass index is 35.48 kg/m. GENERAL:  Well appearing HEENT: Pupils equal round and reactive, fundi not visualized, oral mucosa unremarkable NECK: JVP at clavicle sitting upright.  Waveform within normal limits, carotid upstroke brisk and symmetric, no bruits, no thyromegaly LUNGS:  Clear to auscultation bilaterally HEART:  RRR.  PMI not displaced or sustained,S1 and S2 within normal limits, no S3, no S4, no clicks, no rubs, no murmurs ABD:  Flat, positive bowel sounds normal in frequency in pitch, no bruits, no rebound, no guarding, no midline pulsatile mass, no hepatomegaly, no splenomegaly EXT:  2 plus pulses throughout, no edema, no cyanosis no clubbing SKIN:  No rashes no nodules NEURO:  Cranial nerves II through XII grossly intact, motor grossly intact throughout PSYCH:  Cognitively intact, oriented to person place and time   ASSESSMENT & PLAN:    # Exertional chest pain: Resolved.  ETT was negative for ischemia 10/2017.  Was encouraged to start back exercising.   # Hypertension: Blood pressures controlled here today but has been elevated at home and tends to creep up by the evenings.  She has a cough with lisinopril.  We will switch her to losartan 25 mg daily.  Check a BMP in a  week.  She will continue to track her blood pressures and let us know if she is not at goal, which is less than 130/80.  # Hyperlipidemia:   Continue simvastatin.  LDL was 84 on 12/2020.  # Obesity: Encouraged exercise and continued weight loss.  We will refer her to the PREP program to the [facility].  # Atrial tachycardia:  Stable.  No nodal agents required.  Medication Adjustments/Labs and Tests Ordered: Current medicines are reviewed at length with the patient today.  Concerns regarding medicines are outlined above.   Tests Ordered: Orders Placed This Encounter  Procedures   Basic metabolic panel   Amb Referral To Provider Referral Exercise Program (P.R.E.P)     Medication Changes: Meds ordered this encounter  Medications   losartan (COZAAR) 50 MG tablet    Sig: Take 1 tablet (50 mg total) by mouth daily.    Dispense:  90 tablet    Refill:  3    D/C LISINOPRIL     Disposition:  Follow up in 3 month(s)  Signed, Skeet Latch, MD  04/12/2021 9:25 AM    [facility]

## 2021-04-13 ENCOUNTER — Encounter (HOSPITAL_BASED_OUTPATIENT_CLINIC_OR_DEPARTMENT_OTHER): Payer: Self-pay

## 2021-04-14 ENCOUNTER — Telehealth: Payer: Self-pay

## 2021-04-14 NOTE — Telephone Encounter (Signed)
Left voicemail, called to discuss PREP program referral.

## 2021-04-27 ENCOUNTER — Telehealth: Payer: Self-pay

## 2021-04-27 NOTE — Telephone Encounter (Signed)
Pt. Returned call to me re PREP class. She is interested and wants to participate; [place] so [employer] would work best for her schedule; could also do lunch class if available at [place]; will ask [facility] to contact her about next available classes at [place].

## 2021-04-27 NOTE — Telephone Encounter (Signed)
VMT pt requesting cb to discuss next PREP class starting at [facility] on Oct 3rd -M/W 1pm-215pm Next night class will not start until Nov.

## 2021-05-01 ENCOUNTER — Ambulatory Visit: Payer: Federal, State, Local not specified - PPO | Admitting: Neurology

## 2021-05-03 ENCOUNTER — Other Ambulatory Visit: Payer: Self-pay | Admitting: Cardiovascular Disease

## 2021-05-08 ENCOUNTER — Telehealth: Payer: Self-pay

## 2021-05-08 NOTE — Telephone Encounter (Signed)
Received call back from pt on 9/19 confirming interest in starting PREP on 10/3 at [place] to text pt Scheduled intake for 9/28 at 10am at [facility] via text

## 2021-05-11 NOTE — Progress Notes (Signed)
Met today to complete initial assessment for PREP  Pt is coming from work and feels it would be better to do an evening class instead  Collected initial goals and history. Next evening class will start at end of NOV. Pt is okay with this. Will call her mid November to firm up start. Will do VS at start of class.

## 2021-05-24 DIAGNOSIS — R569 Unspecified convulsions: Secondary | ICD-10-CM | POA: Diagnosis not present

## 2021-06-04 ENCOUNTER — Other Ambulatory Visit: Payer: Self-pay | Admitting: Cardiovascular Disease

## 2021-06-21 DIAGNOSIS — Z20822 Contact with and (suspected) exposure to covid-19: Secondary | ICD-10-CM | POA: Diagnosis not present

## 2021-06-27 DIAGNOSIS — J209 Acute bronchitis, unspecified: Secondary | ICD-10-CM | POA: Diagnosis not present

## 2021-06-27 DIAGNOSIS — K5909 Other constipation: Secondary | ICD-10-CM | POA: Insufficient documentation

## 2021-06-27 DIAGNOSIS — J302 Other seasonal allergic rhinitis: Secondary | ICD-10-CM | POA: Insufficient documentation

## 2021-06-27 DIAGNOSIS — K279 Peptic ulcer, site unspecified, unspecified as acute or chronic, without hemorrhage or perforation: Secondary | ICD-10-CM | POA: Insufficient documentation

## 2021-06-27 DIAGNOSIS — E041 Nontoxic single thyroid nodule: Secondary | ICD-10-CM | POA: Insufficient documentation

## 2021-06-27 DIAGNOSIS — Z03818 Encounter for observation for suspected exposure to other biological agents ruled out: Secondary | ICD-10-CM | POA: Diagnosis not present

## 2021-06-27 DIAGNOSIS — Z20822 Contact with and (suspected) exposure to covid-19: Secondary | ICD-10-CM | POA: Diagnosis not present

## 2021-06-27 DIAGNOSIS — E119 Type 2 diabetes mellitus without complications: Secondary | ICD-10-CM | POA: Insufficient documentation

## 2021-06-27 DIAGNOSIS — J069 Acute upper respiratory infection, unspecified: Secondary | ICD-10-CM | POA: Diagnosis not present

## 2021-06-27 DIAGNOSIS — D472 Monoclonal gammopathy: Secondary | ICD-10-CM | POA: Insufficient documentation

## 2021-06-30 ENCOUNTER — Telehealth: Payer: Self-pay

## 2021-06-30 NOTE — Telephone Encounter (Signed)
LVMT pt requesting call back to discuss next PREP class starting on 07/25/21 T/TH 615p-730pm at [facility]

## 2021-07-12 ENCOUNTER — Telehealth: Payer: Self-pay

## 2021-07-12 DIAGNOSIS — Z5181 Encounter for therapeutic drug level monitoring: Secondary | ICD-10-CM | POA: Diagnosis not present

## 2021-07-12 DIAGNOSIS — I10 Essential (primary) hypertension: Secondary | ICD-10-CM | POA: Diagnosis not present

## 2021-07-12 LAB — BASIC METABOLIC PANEL
BUN/Creatinine Ratio: 19 (ref 12–28)
BUN: 13 mg/dL (ref 8–27)
CO2: 24 mmol/L (ref 20–29)
Calcium: 9.9 mg/dL (ref 8.7–10.3)
Chloride: 102 mmol/L (ref 96–106)
Creatinine, Ser: 0.69 mg/dL (ref 0.57–1.00)
Glucose: 116 mg/dL — ABNORMAL HIGH (ref 70–99)
Potassium: 4.3 mmol/L (ref 3.5–5.2)
Sodium: 141 mmol/L (ref 134–144)
eGFR: 97 mL/min/{1.73_m2} (ref >59)

## 2021-07-12 NOTE — Telephone Encounter (Signed)
Received message from other [place] still interested in participating in [place] to pt requesting call back to confirm interest in PREP class starting at [place] on 07/25/21 at 615p-730p T/TH

## 2021-07-13 ENCOUNTER — Ambulatory Visit (HOSPITAL_BASED_OUTPATIENT_CLINIC_OR_DEPARTMENT_OTHER): Payer: Federal, State, Local not specified - PPO | Admitting: Cardiovascular Disease

## 2021-07-13 ENCOUNTER — Encounter (HOSPITAL_BASED_OUTPATIENT_CLINIC_OR_DEPARTMENT_OTHER): Payer: Self-pay | Admitting: Cardiovascular Disease

## 2021-07-13 ENCOUNTER — Other Ambulatory Visit: Payer: Self-pay

## 2021-07-13 ENCOUNTER — Telehealth: Payer: Self-pay

## 2021-07-13 DIAGNOSIS — E66812 Obesity, class 2: Secondary | ICD-10-CM

## 2021-07-13 DIAGNOSIS — Z6835 Body mass index (BMI) 35.0-35.9, adult: Secondary | ICD-10-CM

## 2021-07-13 DIAGNOSIS — I10 Essential (primary) hypertension: Secondary | ICD-10-CM

## 2021-07-13 DIAGNOSIS — E7800 Pure hypercholesterolemia, unspecified: Secondary | ICD-10-CM

## 2021-07-13 DIAGNOSIS — E1169 Type 2 diabetes mellitus with other specified complication: Secondary | ICD-10-CM | POA: Diagnosis not present

## 2021-07-13 DIAGNOSIS — E669 Obesity, unspecified: Secondary | ICD-10-CM

## 2021-07-13 NOTE — Patient Instructions (Signed)
Medication Instructions:  Your physician recommends that you continue on your current medications as directed. Please refer to the Current Medication list given to you today.   *If you need a refill on your cardiac medications before your next appointment, please call your pharmacy*  Lab Work: FASTING LP/CMET IN 3-4 MONTHS   If you have labs (blood work) drawn today and your tests are completely normal, you will receive your results only by: [place] (if you have MyChart) OR A paper copy in the mail If you have any lab test that is abnormal or we need to change your treatment, we will call you to review the results.  Testing/Procedures: NONE  Follow-Up: At [facility], you and your health needs are our priority.  As part of our continuing mission to provide you with exceptional heart care, we have created designated Provider Care Teams.  These Care Teams include your primary Cardiologist (physician) and Advanced Practice Providers (APPs -  Physician Assistants and Nurse Practitioners) who all work together to provide you with the care you need, when you need it.  We recommend signing up for the patient portal called "MyChart".  Sign up information is provided on this After Visit Summary.  MyChart is used to connect with patients for Virtual Visits (Telemedicine).  Patients are able to view lab/test results, encounter notes, upcoming appointments, etc.  Non-urgent messages can be sent to your provider as well.   To learn more about what you can do with MyChart, go to [email or web address].    Your next appointment:   3-4  month(s) AFTER LABS   The format for your next appointment:   In Person  Provider:   Skeet Latch, MD   Your physician recommends that you schedule a follow-up appointment in: [place]

## 2021-07-13 NOTE — Telephone Encounter (Signed)
VMF and text from pt returning call inquiring about dates for next class.  Offered 12/5 (afternooon)  and 12/13 (evening)  classes Pt selected 12/13 start.  Intake scheduled for 12/8 at 430p at [facility]

## 2021-07-13 NOTE — Progress Notes (Signed)
Cardiology Office Note   Date:  07/13/2021   ID:  Rebekah [name], DOB 1956-03-12, MRN [id number]  PCP:  Rebekah [name]  Cardiologist:  Rebekah [name]  Electrophysiologist:  None   Evaluation Performed:  Follow-Up Visit  Chief Complaint:  hypertension  History of Present Illness:    Rebekah [name] is a 65 y.o. female with hypertension, hyperlipidemia, TIA, Rheumatoid arthritis and diabetes here for follow up.  She was initially seen 09/2017 for the evaluation of chest pain.  She presented to urgent care 05/2017 with chest pain.  At the time she reported exertional chest pain that occurred while walking on the treadmill or walking for exercise.  The episodes lasted for several hours at a time.  She felt as though her at the center of her chest hurt when she tried to take a deep breath. In the ED her EKG revealed LVH with repolarization abnormalities versus lateral ST elevations.  Cardiac enzymes were negative.  D-dimer was mildly elevated and she had recently flown across the country.  She had a chest CT that was negative for pulmonary embolism.  She followed up with Rebekah [name] and was referred to cardiology given her risk factors.  She had an ETT 10/2017 that was negative for ischemia.  At her last appointment her blood pressure was stable but she was volume overloaded.  She was taking both HCTZ and Lasix.  Hydrochlorothiazide was discontinued and Lasix was increased.  Amlodipine was also increased to make sure her blood pressure stayed well have been controlled.   At her last appointment she reported recurrent episodes of L sided numbness. They were sporadic and lasted for a few minutes.  She saw neurology and was started on pregabalin. She denied any further episodes while on pregabalin. She saw her PCP and was started on lisinopril.  Her blood pressure had been somewhat labile. She had a cough on lisinopril so it was switched to losartan. She was referred to PREP. Overall, she  has been feeling good with no new complaints. She continues to suffer from intermittent cough. Her cough seems to occur when she is around air conditioning or moving air. It usually starts as a tickle in her throat, and then she begins sneezing simultaneously. She is not participating in exercise aside from walking "back and forth around the office". She notes having increased fatigue by Wednesday, but returns to baseline for the rest of the week. By April she has a goal of losing weight, as well as improving her cholesterol and her A1C. She is looking forward to starting the PREP program soon. She denies any palpitations, chest pain, or shortness of breath. No lightheadedness, headaches, syncope, orthopnea, PND, or lower extremity edema. Of note, she is recently a new grandmother. Recently she was on a [place] cruise, but only moderately enjoyed it.   Past Medical History:  Diagnosis Date   Arthritis    Atrial tachycardia ([place]) 01/17/2020   Diabetes mellitus type 2 in obese ([place]) 10/10/2017   Essential hypertension 10/10/2017   Hypertension    Morbid obesity ([place]) 10/10/2017   Pure hypercholesterolemia 10/10/2017   Rheumatoid arteritis ([place])    Vitamin D deficiency    Past Surgical History:  Procedure Laterality Date   ABDOMINAL HYSTERECTOMY     biopsy on tongue  1976   BREAST EXCISIONAL BIOPSY Left    CESAREAN SECTION     TUBAL LIGATION       Current Meds  Medication Sig  albuterol (VENTOLIN HFA) 108 (90 Base) MCG/ACT inhaler Inhale 2 puffs into the lungs every 4 (four) hours.   amLODipine (NORVASC) 10 MG tablet TAKE 1 TABLET BY MOUTH EVERY DAY   aspirin 81 MG chewable tablet Chew 81 mg by mouth daily.   cetirizine (ZYRTEC) 10 MG tablet Take 10 mg by mouth daily.   Cholecalciferol 25 MCG (1000 UT) capsule cholecalciferol (vitamin D3) 25 mcg (1,000 unit) capsule  TAKE ONE CAPSULE BY MOUTH EVERY DAY   estradiol (ESTRACE) 1 MG tablet Take 1 mg by mouth daily.   EUCRISA 2 % OINT Apply 1  application topically 2 (two) times daily.   fluticasone (FLONASE) 50 MCG/ACT nasal spray Place into both nostrils.   furosemide (LASIX) 40 MG tablet TAKE 1 TABLET BY MOUTH EVERY DAY   HYDROcodone-acetaminophen (NORCO/VICODIN) 5-325 MG tablet Take 1-2 tablets by mouth every 6 (six) hours as needed for severe pain.   hydrOXYzine (ATARAX/VISTARIL) 25 MG tablet Take 1 tablet by mouth every 6 (six) hours as needed.   leflunomide (ARAVA) 20 MG tablet Take 20 mg by mouth daily.   lubiprostone (AMITIZA) 24 MCG capsule Take 1 capsule (24 mcg total) by mouth 2 (two) times daily as needed.   metFORMIN (GLUCOPHAGE) 500 MG tablet Take 1 tablet (500 mg total) by mouth 2 (two) times daily with a meal.   omeprazole (PRILOSEC) 40 MG capsule Take 40 mg by mouth daily.   pregabalin (LYRICA) 100 MG capsule Take 1 capsule (100 mg total) by mouth 2 (two) times daily.   simvastatin (ZOCOR) 40 MG tablet TAKE 1 TABLET BY MOUTH EVERY DAY IN THE EVENING   Vitamin D, Ergocalciferol, (DRISDOL) 1.25 MG (50000 UNIT) CAPS capsule Take 1 capsule (50,000 Units total) by mouth every 7 (seven) days.     Allergies:   Lisinopril and Crestor [rosuvastatin]   Social History   Tobacco Use   Smoking status: Some Days    Types: Cigarettes    Last attempt to quit: 09/11/2010    Years since quitting: 10.8   Smokeless tobacco: Never  Substance Use Topics   Alcohol use: Yes    Comment: occass   Drug use: Never     Family Hx: The patient's family history includes Alzheimer's disease in her maternal grandfather; CAD in her mother; CVA in her sister; Cancer in her paternal grandmother; Cerebral aneurysm in her father; Heart attack in her maternal grandmother; Stroke in her maternal grandmother and sister.  ROS:   Please see the history of present illness.    (+) Cough with sneezing (+) Fatigue All other systems reviewed and are negative.   Prior CV studies:   The following studies were reviewed today:  ETT 10/2017: There  was no ST segment deviation noted during stress. 76min, 41sec - good exercise time Low risk exercise treadmill test with no electrocardiographic evidence of ischemia. Normal  Labs/Other Tests and Data Reviewed:    EKG:  07/13/2021: EKG was not ordered.  An ECG dated 01/15/2020 was personally reviewed today and demonstrated:  Sinus rhythm.  Rate 73 bpm.   01/15/20: Atrial tachycardia.  3: 1 AV conduction.  LAD.  LVH with secondary repolarization abnormalities  Recent Labs: 12/23/2020: ALT 15; Hemoglobin 13.5; Platelets 242 07/12/2021: BUN 13; Creatinine, Ser 0.69; Potassium 4.3; Sodium 141   Recent Lipid Panel Lab Results  Component Value Date/Time   CHOL 172 12/23/2020 10:18 AM   TRIG 68 12/23/2020 10:18 AM   HDL 75 12/23/2020 10:18 AM   CHOLHDL 2.3  12/23/2020 10:18 AM   LDLCALC 84 12/23/2020 10:18 AM    Wt Readings from Last 3 Encounters:  07/13/21 230 lb 11.2 oz (104.6 kg)  04/12/21 219 lb 12.8 oz (99.7 kg)  12/23/20 217 lb 3.2 oz (98.5 kg)     Objective:    VS:  BP 130/76 (BP Location: Left Arm, Patient Position: Sitting, Cuff Size: Large)   Pulse 82   Ht 5\' 6"  (1.676 m)   Wt 230 lb 11.2 oz (104.6 kg)   LMP 08/13/1998   SpO2 98%   BMI 37.24 kg/m  , BMI Body mass index is 37.24 kg/m. GENERAL:  Well appearing HEENT: Pupils equal round and reactive, fundi not visualized, oral mucosa unremarkable NECK: No JVD.  Waveform within normal limits, carotid upstroke brisk and symmetric, no bruits, no thyromegaly LUNGS:  Clear to auscultation bilaterally HEART:  RRR.  PMI not displaced or sustained,S1 and S2 within normal limits, no S3, no S4, no clicks, no rubs, no murmurs ABD:  Flat, positive bowel sounds normal in frequency in pitch, no bruits, no rebound, no guarding, no midline pulsatile mass, no hepatomegaly, no splenomegaly EXT:  2 plus pulses throughout, no edema, no cyanosis no clubbing SKIN:  No rashes no nodules NEURO:  Cranial nerves II through XII grossly intact, motor  grossly intact throughout PSYCH:  Cognitively intact, oriented to person place and time   ASSESSMENT & PLAN:    Essential hypertension Blood pressure has been well-controlled.  Continue amlodipine and losartan.  Pure hypercholesterolemia Lipids were above goal.  She plans to start back exercising and will start with the prep program next month.  She is committed to working on diet and exercise.  We will recheck lipids and a CMP in April.  Continue simvastatin for now.  Diabetes mellitus type 2 in obese [facility]) She has been struggling with her diabetes and weight loss.  She is on metformin.  We did discuss increasing her exercise and diet changes.  However I think she would also be a good candidate for Ozempic.  We will have her see our pharmacist.  I would like for her to start on 0.25 mg once a week for 4 weeks then 0.5 mg for 4 weeks then 1 mg.  Class 2 severe obesity with serious comorbidity and body mass index (BMI) of 35.0 to 35.9 in adult [facility]) Referral to PrEP and start Ozempic as above.  Recommend exercising at least 150 minutes weekly.    Medication Adjustments/Labs and Tests Ordered: Current medicines are reviewed at length with the patient today.  Concerns regarding medicines are outlined above.   Tests Ordered: Orders Placed This Encounter  Procedures   Lipid panel   Comprehensive metabolic panel   AMB Referral to [facility]    Medication Changes: No orders of the defined types were placed in this encounter.  Disposition:  Follow up with Verena Shawgo C. Oval Linsey, [name], [facility] after lab work in 11/2020.  I,Mathew Stumpf,acting as a Education administrator for Rebekah [name].,have documented all relevant documentation on the behalf of Rebekah [name],as directed by  Rebekah [name] while in the presence of Rebekah [name].  I, [place] Oval Linsey, [name] have reviewed all documentation for this visit.  The documentation of the exam, diagnosis, procedures, and orders on  07/13/2021 are all accurate and complete.   Signed, Rebekah [name]  07/13/2021 8:40 AM    [place]

## 2021-07-13 NOTE — Assessment & Plan Note (Addendum)
Lipids were above goal.  She plans to start back exercising and will start with the prep program next month.  She is committed to working on diet and exercise.  We will recheck lipids and a CMP in April.  Continue simvastatin for now.

## 2021-07-13 NOTE — Assessment & Plan Note (Signed)
Referral to PrEP and start Ozempic as above.  Recommend exercising at least 150 minutes weekly.

## 2021-07-13 NOTE — Assessment & Plan Note (Signed)
Blood pressure has been well-controlled.  Continue amlodipine and losartan.

## 2021-07-13 NOTE — Assessment & Plan Note (Signed)
She has been struggling with her diabetes and weight loss.  She is on metformin.  We did discuss increasing her exercise and diet changes.  However I think she would also be a good candidate for Ozempic.  We will have her see our pharmacist.  I would like for her to start on 0.25 mg once a week for 4 weeks then 0.5 mg for 4 weeks then 1 mg.

## 2021-07-16 ENCOUNTER — Other Ambulatory Visit: Payer: Self-pay | Admitting: Neurology

## 2021-07-21 NOTE — Progress Notes (Signed)
[facility] PREP Evaluation  Patient Details  Name: Rebekah [name] MRN: [id number] Date of Birth: 11-09-1955 Age: 65 y.o. PCP: Jonathon Jordan, MD  Vitals:   07/20/21 1630  BP: (!) 152/100  Pulse: 76  SpO2: 98%  Weight: 233 lb (105.7 kg)     [facility] Eval - 07/21/21 1500       [facility] "PREP" Location   [facility]PREP" Location [facility]      Referral    Referring Provider Oval Linsey    Reason for referral Hypertension;High Cholesterol;Diabetes;Inactivity;Obesitity/Overweight    Program Start Date 07/25/21   T/TH 615p-730p x 12 wks     Measurement   Waist Circumference 45 inches    Hip Circumference 51 inches    Body fat 48.2 percent      Information for Trainer   Goals goal weight 198, eat healthier, get A1C down, wants to live long life    Current Exercise occ dances    Orthopedic Concerns intermittent hands and knee pain from RA    Pertinent Medical History HTN, high lipids, TIA/Stroke, RA, DM    Current Barriers none    Medications that affect exercise Medication causing dizziness/drowsiness      Timed Up and Go (TUGS)   Timed Up and Go Low risk <9 seconds      Mobility and Daily Activities   I find it easy to walk up or down two or more flights of stairs. 3    I have no trouble taking out the trash. 4    I do housework such as vacuuming and dusting on my own without difficulty. 4    I can easily lift a gallon of milk (8lbs). 4    I can easily walk a mile. 4    I have no trouble reaching into high cupboards or reaching down to pick up something from the floor. 4    I do not have trouble doing out-door work such as Armed forces logistics/support/administrative officer, raking leaves, or gardening. 4      Mobility and Daily Activities   I feel younger than my age. 4    I feel independent. 4    I feel energetic. 2    I live an active life.  4    I feel strong. 3    I feel healthy. 4    I feel active as other people my age. 3      How fit and strong are you.   Fit and Strong Total Score 51             Past Medical History:  Diagnosis Date   Arthritis    Atrial tachycardia ([place]) 01/17/2020   Diabetes mellitus type 2 in obese ([place]) 10/10/2017   Essential hypertension 10/10/2017   Hypertension    Morbid obesity ([place]) 10/10/2017   Pure hypercholesterolemia 10/10/2017   Rheumatoid arteritis ([place])    Vitamin D deficiency    Past Surgical History:  Procedure Laterality Date   ABDOMINAL HYSTERECTOMY     biopsy on tongue  1976   BREAST EXCISIONAL BIOPSY Left    CESAREAN SECTION     TUBAL LIGATION     Social History   Tobacco Use  Smoking Status Some Days   Types: Cigarettes   Last attempt to quit: 09/11/2010   Years since quitting: 10.8  Smokeless Tobacco Never   Intake completed 07/20/21 BP not controlled. Hasn't been regularly checking BP at home.  Sts has appt with PharmD soon reference BP  Asked her to track her BP 2 x per day so the PharmD can have a log to adjust meds as need be.  Barnett Hatter 07/21/2021, 3:31 PM

## 2021-08-01 DIAGNOSIS — H40023 Open angle with borderline findings, high risk, bilateral: Secondary | ICD-10-CM | POA: Diagnosis not present

## 2021-08-02 DIAGNOSIS — E785 Hyperlipidemia, unspecified: Secondary | ICD-10-CM | POA: Insufficient documentation

## 2021-08-02 DIAGNOSIS — Z7689 Persons encountering health services in other specified circumstances: Secondary | ICD-10-CM | POA: Diagnosis not present

## 2021-08-02 DIAGNOSIS — Z01419 Encounter for gynecological examination (general) (routine) without abnormal findings: Secondary | ICD-10-CM | POA: Diagnosis not present

## 2021-08-02 DIAGNOSIS — N898 Other specified noninflammatory disorders of vagina: Secondary | ICD-10-CM | POA: Diagnosis not present

## 2021-08-02 DIAGNOSIS — Z1231 Encounter for screening mammogram for malignant neoplasm of breast: Secondary | ICD-10-CM | POA: Diagnosis not present

## 2021-08-02 DIAGNOSIS — Z113 Encounter for screening for infections with a predominantly sexual mode of transmission: Secondary | ICD-10-CM | POA: Diagnosis not present

## 2021-08-02 DIAGNOSIS — R569 Unspecified convulsions: Secondary | ICD-10-CM | POA: Insufficient documentation

## 2021-08-03 ENCOUNTER — Ambulatory Visit: Payer: Federal, State, Local not specified - PPO

## 2021-08-18 DIAGNOSIS — I10 Essential (primary) hypertension: Secondary | ICD-10-CM | POA: Diagnosis not present

## 2021-08-18 DIAGNOSIS — Z1389 Encounter for screening for other disorder: Secondary | ICD-10-CM | POA: Diagnosis not present

## 2021-08-18 DIAGNOSIS — E668 Other obesity: Secondary | ICD-10-CM | POA: Diagnosis not present

## 2021-08-18 DIAGNOSIS — M069 Rheumatoid arthritis, unspecified: Secondary | ICD-10-CM | POA: Diagnosis not present

## 2021-08-18 DIAGNOSIS — Z6837 Body mass index (BMI) 37.0-37.9, adult: Secondary | ICD-10-CM | POA: Diagnosis not present

## 2021-08-18 DIAGNOSIS — E1169 Type 2 diabetes mellitus with other specified complication: Secondary | ICD-10-CM | POA: Diagnosis not present

## 2021-08-18 DIAGNOSIS — Z713 Dietary counseling and surveillance: Secondary | ICD-10-CM | POA: Diagnosis not present

## 2021-08-18 DIAGNOSIS — E785 Hyperlipidemia, unspecified: Secondary | ICD-10-CM | POA: Diagnosis not present

## 2021-08-23 NOTE — Progress Notes (Signed)
[facility] PREP Weekly Session  Patient Details  Name: Rebekah [name] MRN: [id number] Date of Birth: 11-11-1955 Age: 66 y.o. PCP: Jonathon Jordan, MD  Vitals:   08/22/21 1830  Weight: 230 lb (104.3 kg)     [facility] Weekly seesion - 08/23/21 1000       [facility] "PREP" Location   [facility] "PREP" Location [facility]      Weekly Session   Topic Discussed Health habits    Minutes exercised this week 110 minutes    Classes attended to date 5           Class held on 08/22/21  Barnett Hatter 08/23/2021, 10:12 AM

## 2021-08-24 ENCOUNTER — Other Ambulatory Visit: Payer: Self-pay

## 2021-08-24 ENCOUNTER — Ambulatory Visit: Payer: Federal, State, Local not specified - PPO | Admitting: Pharmacist

## 2021-08-24 ENCOUNTER — Telehealth: Payer: Self-pay | Admitting: Pharmacist

## 2021-08-24 VITALS — BP 109/73 | HR 77 | Resp 16 | Ht 66.0 in | Wt 225.6 lb

## 2021-08-24 DIAGNOSIS — E1169 Type 2 diabetes mellitus with other specified complication: Secondary | ICD-10-CM

## 2021-08-24 DIAGNOSIS — I10 Essential (primary) hypertension: Secondary | ICD-10-CM

## 2021-08-24 DIAGNOSIS — E669 Obesity, unspecified: Secondary | ICD-10-CM | POA: Diagnosis not present

## 2021-08-24 DIAGNOSIS — Z6835 Body mass index (BMI) 35.0-35.9, adult: Secondary | ICD-10-CM

## 2021-08-24 MED ORDER — OZEMPIC (0.25 OR 0.5 MG/DOSE) 2 MG/1.5ML ~~LOC~~ SOPN: 0.5000 mg | PEN_INJECTOR | SUBCUTANEOUS | 0 refills | Status: DC

## 2021-08-24 MED ORDER — AMLODIPINE BESYLATE 10 MG PO TABS: 10.0000 mg | ORAL_TABLET | Freq: Every day | ORAL | 1 refills | Status: DC

## 2021-08-24 MED ORDER — WEGOVY 0.5 MG/0.5ML ~~LOC~~ SOAJ: 0.5000 mg | SUBCUTANEOUS | 0 refills | Status: DC

## 2021-08-24 NOTE — Progress Notes (Signed)
Patient ID: Rebekah [name]                 DOB: [birth year]                    MRN: [id number]     HPI: Rebekah [name] is a 66 y.o. female patient referred to pharmacy clinic by Dr Oval Linsey to initiate weight loss therapy with GLP1-RA. PMH is significant for obesity complicated by chronic medical conditions including T2DM (A1c 6.5), HTN, and HLD. Most recent BMI 36.41, previous BMI 37.12.  Patient presents today to discuss weight loss and glucose lowering therapy.  Has enrolled in PREP classes at the [facility] and is working on diet changes.  Has been cutting calories and has already lost 5# since enrolling.  Works for Financial controller to defendants for trial.    Current weight management medications:  N/A  Previously tried meds: N/A  Current meds that may affect weight: Estradiol  Baseline weight/BMI: 36.41  Insurance payor: [employer]: Lab Results  Component Value Date   HGBA1C 6.5 (H) 12/23/2020    Wt Readings from Last 1 Encounters:  08/24/21 225 lb 9.6 oz (102.3 kg)    BP Readings from Last 1 Encounters:  08/24/21 109/73   Pulse Readings from Last 1 Encounters:  08/24/21 77       Component Value Date/Time   CHOL 172 12/23/2020 1018   TRIG 68 12/23/2020 1018   HDL 75 12/23/2020 1018   CHOLHDL 2.3 12/23/2020 1018   [place] 84 12/23/2020 1018    Past Medical History:  Diagnosis Date   Arthritis    Atrial tachycardia ([place]) 01/17/2020   Diabetes mellitus type 2 in obese ([place]) 10/10/2017   Essential hypertension 10/10/2017   Hypertension    Morbid obesity ([place]) 10/10/2017   Pure hypercholesterolemia 10/10/2017   Rheumatoid arteritis ([place])    Vitamin D deficiency     Current Outpatient Medications on File Prior to Visit  Medication Sig Dispense Refill   albuterol (VENTOLIN HFA) 108 (90 Base) MCG/ACT inhaler Inhale 2 puffs into the lungs every 4 (four) hours.     amLODipine (NORVASC) 10 MG tablet TAKE 1 TABLET BY MOUTH EVERY  DAY 90 tablet 0   aspirin 81 MG chewable tablet Chew 81 mg by mouth daily.     cetirizine (ZYRTEC) 10 MG tablet Take 10 mg by mouth daily.     Cholecalciferol 25 MCG (1000 UT) capsule cholecalciferol (vitamin D3) 25 mcg (1,000 unit) capsule  TAKE ONE CAPSULE BY MOUTH EVERY DAY     estradiol (ESTRACE) 0.1 MG/GM vaginal cream estradiol 0.01% (0.1 mg/gram) vaginal cream  Insert 0.5 g twice a week by vaginal route.     estradiol (ESTRACE) 2 MG tablet estradiol 2 mg tablet  Take 1 tablet every day by oral route.     EUCRISA 2 % OINT Apply 1 application topically 2 (two) times daily.     fluticasone (FLONASE) 50 MCG/ACT nasal spray Place into both nostrils.     furosemide (LASIX) 40 MG tablet TAKE 1 TABLET BY MOUTH EVERY DAY 90 tablet 1   HYDROcodone-acetaminophen (NORCO/VICODIN) 5-325 MG tablet Take 1-2 tablets by mouth every 6 (six) hours as needed for severe pain. 10 tablet 0   hydrOXYzine (ATARAX/VISTARIL) 25 MG tablet Take 1 tablet by mouth every 6 (six) hours as needed.     leflunomide (ARAVA) 20 MG tablet Take 20 mg by mouth daily.  losartan (COZAAR) 50 MG tablet Take 1 tablet (50 mg total) by mouth daily. 90 tablet 3   lubiprostone (AMITIZA) 24 MCG capsule Take 1 capsule (24 mcg total) by mouth 2 (two) times daily as needed. 60 capsule 0   metFORMIN (GLUCOPHAGE) 500 MG tablet Take 1 tablet (500 mg total) by mouth 2 (two) times daily with a meal. 60 tablet 0   omeprazole (PRILOSEC) 40 MG capsule Take 40 mg by mouth daily.     pregabalin (LYRICA) 100 MG capsule TAKE 1 CAPSULE BY MOUTH TWICE A DAY 60 capsule 2   simvastatin (ZOCOR) 40 MG tablet TAKE 1 TABLET BY MOUTH EVERY DAY IN THE EVENING 90 tablet 3   estradiol (ESTRACE) 1 MG tablet Take 1 mg by mouth daily. (Patient not taking: Reported on 08/24/2021)     Vitamin D, Ergocalciferol, (DRISDOL) 1.25 MG (50000 UNIT) CAPS capsule Take 1 capsule (50,000 Units total) by mouth every 7 (seven) days. (Patient not taking: Reported on 08/24/2021) 4  capsule 0   No current facility-administered medications on file prior to visit.    Allergies  Allergen Reactions   Lisinopril Cough   Crestor [Rosuvastatin] Other (See Comments)    myalgias     Assessment/Plan:  1. Weight loss/DM - Patient's most recent A1c 6.5 which is at goal however was from May 2022.  Has PCP appointment soon where it will be updated.  Is interested in GLP1a therapy to help with blood sugar control and weight loss.  Has federal [employer] plan so most meds should be covered with PA.  Since it is unclear if Mancel Parsons is available yet, recommended patient begin Ozempic once weekly at 0.25mg  for 4 weeks and then increase to 0.5mg  weekly. Gave sample in room and taught patient how to use using demo pen.  Will complete PA for [facility] and if approved and available, will plan on switching patient when she reaches the 0.5mg  weekly dose.  Patient voiced understanding.  Confirmed patient not pregnant and no personal or family history of medullary thyroid carcinoma (MTC) or Multiple Endocrine Neoplasia syndrome type 2 (MEN 2).   Advised patient on common side effects including nausea, diarrhea, dyspepsia, decreased appetite, and fatigue. Counseled patient on reducing meal size and how to titrate medication to minimize side effects. Counseled patient to call if intolerable side effects or if experiencing dehydration, abdominal pain, or dizziness. Patient will adhere to dietary modifications and will target at least 150 minutes of moderate intensity exercise weekly.   Follow up in 3 months.   Karren Cobble, PharmD, BCACP, [place], [place], [place], [place], [address] Phone: [phone], Fax: [phone]

## 2021-08-24 NOTE — Telephone Encounter (Signed)
PA for [facility] submitted and approved through 02/20/22.  Key: [id number]

## 2021-08-24 NOTE — Patient Instructions (Addendum)
It was nice meeting you today  We would like to keep your blood pressure less than 130/80 and your A1c less than 7.0%  We will start a new medication called Ozempic.  You will inject 0.25mg  once a week for 4 weeks and then increase to 0.5mg  once a week.    I will submit a prior authorization for the [facility] for you.  If it is approved and available, we can switch you to it when you are at the 0.5mg  dose  Continue to weigh yourself at home and practice healthy eating habits  Please call or send a message with any questions!  Karren Cobble, PharmD, BCACP, [place], [place], [place], [place], [address] Phone: [phone], Fax: [phone]

## 2021-08-25 ENCOUNTER — Telehealth (HOSPITAL_BASED_OUTPATIENT_CLINIC_OR_DEPARTMENT_OTHER): Payer: Self-pay | Admitting: Cardiovascular Disease

## 2021-08-25 NOTE — Telephone Encounter (Signed)
error 

## 2021-09-05 DIAGNOSIS — H02402 Unspecified ptosis of left eyelid: Secondary | ICD-10-CM | POA: Diagnosis not present

## 2021-09-06 NOTE — Progress Notes (Signed)
[facility] PREP Weekly Session  Patient Details  Name: Rebekah [name] MRN: [id number] Date of Birth: 12-Jun-1956 Age: 66 y.o. PCP: Jonathon Jordan, MD  Vitals:   09/05/21 1830  Weight: 223 lb 9.6 oz (101.4 kg)     [facility] Weekly seesion - 09/06/21 1200       [facility] "PREP" Location   [facility] "PREP" Location [facility]      Weekly Session   Topic Discussed Stress management and problem solving    Minutes exercised this week 45 minutes    Classes attended to date 6            Late entry: class held on 09/05/21 Barnett Hatter 09/06/2021, 12:45 PM

## 2021-09-11 DIAGNOSIS — R569 Unspecified convulsions: Secondary | ICD-10-CM | POA: Diagnosis not present

## 2021-09-11 DIAGNOSIS — M7062 Trochanteric bursitis, left hip: Secondary | ICD-10-CM | POA: Diagnosis not present

## 2021-09-11 DIAGNOSIS — Z79899 Other long term (current) drug therapy: Secondary | ICD-10-CM | POA: Diagnosis not present

## 2021-09-11 DIAGNOSIS — M069 Rheumatoid arthritis, unspecified: Secondary | ICD-10-CM | POA: Diagnosis not present

## 2021-09-19 ENCOUNTER — Telehealth: Payer: Self-pay | Admitting: Pharmacist

## 2021-09-19 ENCOUNTER — Encounter (HOSPITAL_BASED_OUTPATIENT_CLINIC_OR_DEPARTMENT_OTHER): Payer: Self-pay | Admitting: Cardiovascular Disease

## 2021-09-19 DIAGNOSIS — E1169 Type 2 diabetes mellitus with other specified complication: Secondary | ICD-10-CM

## 2021-09-19 DIAGNOSIS — E669 Obesity, unspecified: Secondary | ICD-10-CM

## 2021-09-19 NOTE — Telephone Encounter (Signed)
Called patient and left message on machine to see if patient wants to continue Ozempic or switch to [name]

## 2021-09-21 NOTE — Telephone Encounter (Signed)
Left message on machine.

## 2021-09-25 NOTE — Telephone Encounter (Signed)
Kristeen Mans, It looks like this was routed to you so I am sending this to you, because I don't see anything if you spoke with patient. Please advise!

## 2021-09-25 NOTE — Telephone Encounter (Signed)
Patient is calling in regards to this matter due to not being contacted in regards to it. Advised her I will send a follow up message so the patient is called back today. States that she ran out of the medication on 02/10 and is going out of the country soon. Please advise.

## 2021-09-26 MED ORDER — WEGOVY 0.5 MG/0.5ML ~~LOC~~ SOAJ: 0.5000 mg | SUBCUTANEOUS | 0 refills | Status: DC

## 2021-09-26 NOTE — Telephone Encounter (Signed)
This patient is getting back to you! Thank you for your help!

## 2021-11-06 DIAGNOSIS — Z1389 Encounter for screening for other disorder: Secondary | ICD-10-CM | POA: Diagnosis not present

## 2021-11-06 DIAGNOSIS — I10 Essential (primary) hypertension: Secondary | ICD-10-CM | POA: Diagnosis not present

## 2021-11-06 DIAGNOSIS — E785 Hyperlipidemia, unspecified: Secondary | ICD-10-CM | POA: Diagnosis not present

## 2021-11-06 DIAGNOSIS — Z1331 Encounter for screening for depression: Secondary | ICD-10-CM | POA: Diagnosis not present

## 2021-11-06 DIAGNOSIS — Z713 Dietary counseling and surveillance: Secondary | ICD-10-CM | POA: Diagnosis not present

## 2021-11-06 DIAGNOSIS — E668 Other obesity: Secondary | ICD-10-CM | POA: Diagnosis not present

## 2021-11-06 DIAGNOSIS — Z6836 Body mass index (BMI) 36.0-36.9, adult: Secondary | ICD-10-CM | POA: Diagnosis not present

## 2021-11-06 DIAGNOSIS — M069 Rheumatoid arthritis, unspecified: Secondary | ICD-10-CM | POA: Diagnosis not present

## 2021-11-06 DIAGNOSIS — E1169 Type 2 diabetes mellitus with other specified complication: Secondary | ICD-10-CM | POA: Diagnosis not present

## 2021-11-06 DIAGNOSIS — D519 Vitamin B12 deficiency anemia, unspecified: Secondary | ICD-10-CM | POA: Diagnosis not present

## 2021-11-13 ENCOUNTER — Ambulatory Visit (HOSPITAL_BASED_OUTPATIENT_CLINIC_OR_DEPARTMENT_OTHER): Payer: Federal, State, Local not specified - PPO | Admitting: Cardiovascular Disease

## 2021-11-16 ENCOUNTER — Ambulatory Visit: Payer: Federal, State, Local not specified - PPO

## 2021-11-27 DIAGNOSIS — M6281 Muscle weakness (generalized): Secondary | ICD-10-CM | POA: Diagnosis not present

## 2021-11-27 DIAGNOSIS — H02402 Unspecified ptosis of left eyelid: Secondary | ICD-10-CM | POA: Diagnosis not present

## 2021-11-27 DIAGNOSIS — R471 Dysarthria and anarthria: Secondary | ICD-10-CM | POA: Diagnosis not present

## 2021-11-27 DIAGNOSIS — Z79899 Other long term (current) drug therapy: Secondary | ICD-10-CM | POA: Diagnosis not present

## 2021-11-27 DIAGNOSIS — R569 Unspecified convulsions: Secondary | ICD-10-CM | POA: Diagnosis not present

## 2021-12-01 DIAGNOSIS — H401231 Low-tension glaucoma, bilateral, mild stage: Secondary | ICD-10-CM | POA: Diagnosis not present

## 2021-12-04 DIAGNOSIS — E1169 Type 2 diabetes mellitus with other specified complication: Secondary | ICD-10-CM | POA: Diagnosis not present

## 2021-12-04 DIAGNOSIS — I10 Essential (primary) hypertension: Secondary | ICD-10-CM | POA: Diagnosis not present

## 2021-12-04 DIAGNOSIS — E668 Other obesity: Secondary | ICD-10-CM | POA: Diagnosis not present

## 2021-12-04 DIAGNOSIS — M069 Rheumatoid arthritis, unspecified: Secondary | ICD-10-CM | POA: Diagnosis not present

## 2021-12-04 DIAGNOSIS — Z7984 Long term (current) use of oral hypoglycemic drugs: Secondary | ICD-10-CM | POA: Diagnosis not present

## 2021-12-04 DIAGNOSIS — Z1389 Encounter for screening for other disorder: Secondary | ICD-10-CM | POA: Diagnosis not present

## 2021-12-04 DIAGNOSIS — Z713 Dietary counseling and surveillance: Secondary | ICD-10-CM | POA: Diagnosis not present

## 2021-12-04 DIAGNOSIS — E785 Hyperlipidemia, unspecified: Secondary | ICD-10-CM | POA: Diagnosis not present

## 2021-12-04 DIAGNOSIS — Z6835 Body mass index (BMI) 35.0-35.9, adult: Secondary | ICD-10-CM | POA: Diagnosis not present

## 2021-12-15 DIAGNOSIS — H401231 Low-tension glaucoma, bilateral, mild stage: Secondary | ICD-10-CM | POA: Diagnosis not present

## 2021-12-18 DIAGNOSIS — E119 Type 2 diabetes mellitus without complications: Secondary | ICD-10-CM | POA: Diagnosis not present

## 2021-12-18 DIAGNOSIS — Z79899 Other long term (current) drug therapy: Secondary | ICD-10-CM | POA: Diagnosis not present

## 2021-12-18 DIAGNOSIS — E041 Nontoxic single thyroid nodule: Secondary | ICD-10-CM | POA: Diagnosis not present

## 2021-12-18 DIAGNOSIS — E559 Vitamin D deficiency, unspecified: Secondary | ICD-10-CM | POA: Diagnosis not present

## 2021-12-22 DIAGNOSIS — Z0000 Encounter for general adult medical examination without abnormal findings: Secondary | ICD-10-CM | POA: Diagnosis not present

## 2021-12-22 DIAGNOSIS — Z23 Encounter for immunization: Secondary | ICD-10-CM | POA: Diagnosis not present

## 2021-12-25 DIAGNOSIS — I10 Essential (primary) hypertension: Secondary | ICD-10-CM | POA: Diagnosis not present

## 2021-12-25 DIAGNOSIS — Z1389 Encounter for screening for other disorder: Secondary | ICD-10-CM | POA: Diagnosis not present

## 2021-12-25 DIAGNOSIS — Z7984 Long term (current) use of oral hypoglycemic drugs: Secondary | ICD-10-CM | POA: Diagnosis not present

## 2021-12-25 DIAGNOSIS — Z6833 Body mass index (BMI) 33.0-33.9, adult: Secondary | ICD-10-CM | POA: Diagnosis not present

## 2021-12-25 DIAGNOSIS — E1169 Type 2 diabetes mellitus with other specified complication: Secondary | ICD-10-CM | POA: Diagnosis not present

## 2021-12-25 DIAGNOSIS — Z713 Dietary counseling and surveillance: Secondary | ICD-10-CM | POA: Diagnosis not present

## 2021-12-25 DIAGNOSIS — M069 Rheumatoid arthritis, unspecified: Secondary | ICD-10-CM | POA: Diagnosis not present

## 2021-12-25 DIAGNOSIS — E668 Other obesity: Secondary | ICD-10-CM | POA: Diagnosis not present

## 2021-12-25 DIAGNOSIS — E785 Hyperlipidemia, unspecified: Secondary | ICD-10-CM | POA: Diagnosis not present

## 2022-01-09 DIAGNOSIS — H02132 Senile ectropion of right lower eyelid: Secondary | ICD-10-CM | POA: Diagnosis not present

## 2022-01-15 DIAGNOSIS — M069 Rheumatoid arthritis, unspecified: Secondary | ICD-10-CM | POA: Diagnosis not present

## 2022-01-15 DIAGNOSIS — Z79899 Other long term (current) drug therapy: Secondary | ICD-10-CM | POA: Diagnosis not present

## 2022-01-16 ENCOUNTER — Ambulatory Visit (HOSPITAL_BASED_OUTPATIENT_CLINIC_OR_DEPARTMENT_OTHER): Payer: Federal, State, Local not specified - PPO | Admitting: Cardiovascular Disease

## 2022-01-16 ENCOUNTER — Encounter (HOSPITAL_BASED_OUTPATIENT_CLINIC_OR_DEPARTMENT_OTHER): Payer: Self-pay | Admitting: Cardiovascular Disease

## 2022-01-16 DIAGNOSIS — I10 Essential (primary) hypertension: Secondary | ICD-10-CM | POA: Diagnosis not present

## 2022-01-16 DIAGNOSIS — E7800 Pure hypercholesterolemia, unspecified: Secondary | ICD-10-CM

## 2022-01-16 DIAGNOSIS — Z6835 Body mass index (BMI) 35.0-35.9, adult: Secondary | ICD-10-CM

## 2022-01-16 NOTE — Progress Notes (Signed)
Cardiology Office Note   Date:  01/16/2022   ID:  Rebekah [name], DOB 1956/04/12, MRN [id number]  PCP:  Jonathon Jordan, MD  Cardiologist:  Skeet Latch, MD  Electrophysiologist:  None   Evaluation Performed:  Follow-Up Visit  Chief Complaint:  hypertension  History of Present Illness:    Rebekah [name] is a 66 y.o. female with hypertension, hyperlipidemia, TIA, Rheumatoid arthritis and diabetes here for follow up.  She was initially seen 09/2017 for the evaluation of chest pain.  She presented to urgent care 05/2017 with chest pain.  At the time she reported exertional chest pain that occurred while walking on the treadmill or walking for exercise.  The episodes lasted for several hours at a time.  She felt as though her at the center of her chest hurt when she tried to take a deep breath. In the ED her EKG revealed LVH with repolarization abnormalities versus lateral ST elevations.  Cardiac enzymes were negative.  D-dimer was mildly elevated and she had recently flown across the country.  She had a chest CT that was negative for pulmonary embolism.  She followed up with Dr. Stephanie Acre and was referred to cardiology given her risk factors.  She had an ETT 10/2017 that was negative for ischemia.  At her last appointment her blood pressure was stable but she was volume overloaded.  She was taking both HCTZ and Lasix.  Hydrochlorothiazide was discontinued and Lasix was increased.  Amlodipine was also increased to make sure her blood pressure stayed well have been controlled.   She reported recurrent episodes of L sided numbness. They were sporadic and lasted for a few minutes.  She saw neurology and was started on pregabalin. She denied any further episodes while on pregabalin. She saw her PCP and was started on lisinopril.  Her blood pressure had been somewhat labile. She had a cough on lisinopril so it was switched to losartan. She was referred to PREP and started ton Losimpic.  Today, she  says that she has been doing excellent, and reports of her new grandson. Her blood pressure was elevated which she attributes to nervousness of coming to the doctor. She sometimes checks her blood pressure at home and usually it is controlled.  She needs to get a new machine.  She has been exercising 30 min/day three times a week; doing activities like dancing and walking. She remains compliant takes Losartan at night and other medications. Of note, she had a fall last week and she states this is due to not picking up her right foot.  She is happy that her A1C has decreased.   The patient denies chest pain, shortness of breath, nocturnal dyspnea, orthopnea or peripheral edema.  There have been no palpitations, lightheadedness or syncope.    Past Medical History:  Diagnosis Date   Arthritis    Atrial tachycardia ([place]) 01/17/2020   Diabetes mellitus type 2 in obese ([place]) 10/10/2017   Essential hypertension 10/10/2017   Hypertension    Morbid obesity ([place]) 10/10/2017   Pure hypercholesterolemia 10/10/2017   Rheumatoid arteritis ([place])    Vitamin D deficiency    Past Surgical History:  Procedure Laterality Date   ABDOMINAL HYSTERECTOMY     biopsy on tongue  1976   BREAST EXCISIONAL BIOPSY Left    CESAREAN SECTION     TUBAL LIGATION       Current Meds  Medication Sig   albuterol (VENTOLIN HFA) 108 (90 Base) MCG/ACT inhaler Inhale 2 puffs  into the lungs every 4 (four) hours.   amLODipine (NORVASC) 10 MG tablet Take 1 tablet (10 mg total) by mouth daily.   aspirin 81 MG chewable tablet Chew 81 mg by mouth daily.   Cholecalciferol 25 MCG (1000 UT) capsule cholecalciferol (vitamin D3) 25 mcg (1,000 unit) capsule  TAKE ONE CAPSULE BY MOUTH EVERY DAY   estradiol (ESTRACE) 0.1 MG/GM vaginal cream estradiol 0.01% (0.1 mg/gram) vaginal cream  Insert 0.5 g twice a week by vaginal route.   estradiol (ESTRACE) 2 MG tablet estradiol 2 mg tablet  Take 1 tablet every day by oral route.   EUCRISA 2 % OINT  Apply 1 application topically 2 (two) times daily.   furosemide (LASIX) 40 MG tablet TAKE 1 TABLET BY MOUTH EVERY DAY   leflunomide (ARAVA) 20 MG tablet Take 20 mg by mouth daily.   lubiprostone (AMITIZA) 24 MCG capsule Take 1 capsule (24 mcg total) by mouth 2 (two) times daily as needed.   pregabalin (LYRICA) 100 MG capsule TAKE 1 CAPSULE BY MOUTH TWICE A DAY   simvastatin (ZOCOR) 40 MG tablet TAKE 1 TABLET BY MOUTH EVERY DAY IN THE EVENING   VYVANSE 30 MG capsule Take 30 mg by mouth every morning.   WEGOVY 0.5 MG/0.5ML SOAJ Inject 0.5 mg into the skin once a week.   [DISCONTINUED] losartan (COZAAR) 50 MG tablet Take 1 tablet (50 mg total) by mouth daily.   [DISCONTINUED] metFORMIN (GLUCOPHAGE) 500 MG tablet Take 1 tablet (500 mg total) by mouth 2 (two) times daily with a meal.     Allergies:   Lisinopril and Crestor [rosuvastatin]   Social History   Tobacco Use   Smoking status: Former    Types: Cigarettes    Quit date: 08/13/2021    Years since quitting: 0.4   Smokeless tobacco: Never  Substance Use Topics   Alcohol use: Yes    Comment: occass   Drug use: Never     Family Hx: The patient's family history includes Alzheimer's disease in her maternal grandfather; CAD in her mother; CVA in her sister; Cancer in her paternal grandmother; Cerebral aneurysm in her father; Heart attack in her maternal grandmother; Stroke in her maternal grandmother and sister.  ROS:   Please see the history of present illness.    All other systems reviewed and are negative.   Prior CV studies:   The following studies were reviewed today:  ETT 10/2017: There was no ST segment deviation noted during stress. 36mn, 41sec - good exercise time Low risk exercise treadmill test with no electrocardiographic evidence of ischemia. Normal  Labs/Other Tests and Data Reviewed:    EKG:  01/16/2022 EKG: Rate 96. Sinus Rhythm.  07/13/2021: EKG was not ordered.  An ECG dated 01/15/2020 was personally reviewed  today and demonstrated:  Sinus rhythm.  Rate 73 bpm.   01/15/20: Atrial tachycardia.  3: 1 AV conduction.  LAD.  LVH with secondary repolarization abnormalities  Recent Labs: 07/12/2021: BUN 13; Creatinine, Ser 0.69; Potassium 4.3; Sodium 141   Recent Lipid Panel Lab Results  Component Value Date/Time   CHOL 172 12/23/2020 10:18 AM   TRIG 68 12/23/2020 10:18 AM   HDL 75 12/23/2020 10:18 AM   CHOLHDL 2.3 12/23/2020 10:18 AM   LDLCALC 84 12/23/2020 10:18 AM    Wt Readings from Last 3 Encounters:  01/16/22 204 lb 6.4 oz (92.7 kg)  09/05/21 223 lb 9.6 oz (101.4 kg)  08/24/21 225 lb 9.6 oz (102.3 kg)  Objective:    VS:  BP (!) 142/78 (BP Location: Right Arm, Patient Position: Sitting, Cuff Size: Large)   Pulse 96   Ht '5\' 6"'$  (1.676 m)   Wt 204 lb 6.4 oz (92.7 kg)   LMP 08/13/1998   BMI 32.99 kg/m  , BMI Body mass index is 32.99 kg/m. GENERAL:  Well appearing HEENT: Pupils equal round and reactive, fundi not visualized, oral mucosa unremarkable NECK: No JVD.  Waveform within normal limits, carotid upstroke brisk and symmetric, no bruits, no thyromegaly LUNGS:  Clear to auscultation bilaterally HEART:  RRR.  PMI not displaced or sustained,S1 and S2 within normal limits, no S3, no S4, no clicks, no rubs, no murmurs ABD:  Flat, positive bowel sounds normal in frequency in pitch, no bruits, no rebound, no guarding, no midline pulsatile mass, no hepatomegaly, no splenomegaly EXT:  2 plus pulses throughout, no edema, no cyanosis no clubbing SKIN:  No rashes no nodules NEURO:  Cranial nerves II through XII grossly intact, motor grossly intact throughout PSYCH:  Cognitively intact, oriented to person place and time  ASSESSMENT & PLAN:    Essential hypertension Blood pressure was initially elevated.  It was better on repeat but still above goal.  She will track her BP at home and we will check in one month.  Continue amlodipine and losartan.    Hyperlipidemia Lipids are very  well-controlled.  Continue simvastatin.   Class 2 severe obesity with serious comorbidity and body mass index (BMI) of 35.0 to 35.9 in adult [facility]) She is doing a great job with diet and exercise.  She was encouraged to keep it up.  Continue [id number].     Medication Adjustments/Labs and Tests Ordered: Current medicines are reviewed at length with the patient today.  Concerns regarding medicines are outlined above.   Tests Ordered: Orders Placed This Encounter  Procedures   EKG 12-Lead    Medication Changes: No orders of the defined types were placed in this encounter.   Disposition:  Follow up with Rebekah Hebb C. Oval Linsey, MD, [facility] after lab work in 11/2020.  F/U in 1 month   I,Rebekah [name],acting as a Education administrator for [place], MD.,have documented all relevant documentation on the behalf of Skeet Latch, MD,as directed by  Skeet Latch, MD while in the presence of Skeet Latch, MD.   I, Rebekah Oval Linsey, MD have reviewed all documentation for this visit.  The documentation of the exam, diagnosis, procedures, and orders on 01/16/2022 are all accurate and complete.

## 2022-01-16 NOTE — Assessment & Plan Note (Signed)
Blood pressure was initially elevated.  It was better on repeat but still above goal.  She will track her BP at home and we will check in one month.  Continue amlodipine and losartan.

## 2022-01-16 NOTE — Assessment & Plan Note (Addendum)
She is doing a great job with diet and exercise.  She was encouraged to keep it up.  Continue [id number].

## 2022-01-16 NOTE — Patient Instructions (Signed)
Medication Instructions: Your physician recommends that you continue on your current medications as directed. Please refer to the Current Medication list given to you today.   *If you need a refill on your cardiac medications before your next appointment, please call your pharmacy*  Lab Work: NONE   Testing/Procedures: NONE  Follow-Up: At [facility], you and your health needs are our priority.  As part of our continuing mission to provide you with exceptional heart care, we have created designated Provider Care Teams.  These Care Teams include your primary Cardiologist (physician) and Advanced Practice Providers (APPs -  Physician Assistants and Nurse Practitioners) who all work together to provide you with the care you need, when you need it.  We recommend signing up for the patient portal called "MyChart".  Sign up information is provided on this After Visit Summary.  MyChart is used to connect with patients for Virtual Visits (Telemedicine).  Patients are able to view lab/test results, encounter notes, upcoming appointments, etc.  Non-urgent messages can be sent to your provider as well.   To learn more about what you can do with MyChart, go to [email or web address].    Your next appointment:   02/22/2022 10:00 AM DR [facility] D[facility] VISIT   Other Instructions MONITOR YOUR BLOOD PRESSURE TWICE A DAY. LOG AND HAVE AVAILABLE AT FOLLOW UP

## 2022-01-16 NOTE — Telephone Encounter (Signed)
FYI from appointment

## 2022-01-16 NOTE — Assessment & Plan Note (Signed)
Lipids are very well-controlled.  Continue simvastatin.

## 2022-01-18 ENCOUNTER — Encounter (HOSPITAL_BASED_OUTPATIENT_CLINIC_OR_DEPARTMENT_OTHER): Payer: Self-pay

## 2022-01-29 DIAGNOSIS — I10 Essential (primary) hypertension: Secondary | ICD-10-CM | POA: Diagnosis not present

## 2022-01-29 DIAGNOSIS — Z713 Dietary counseling and surveillance: Secondary | ICD-10-CM | POA: Diagnosis not present

## 2022-01-29 DIAGNOSIS — E785 Hyperlipidemia, unspecified: Secondary | ICD-10-CM | POA: Diagnosis not present

## 2022-01-29 DIAGNOSIS — Z7984 Long term (current) use of oral hypoglycemic drugs: Secondary | ICD-10-CM | POA: Diagnosis not present

## 2022-01-29 DIAGNOSIS — E1169 Type 2 diabetes mellitus with other specified complication: Secondary | ICD-10-CM | POA: Diagnosis not present

## 2022-01-29 DIAGNOSIS — Z1389 Encounter for screening for other disorder: Secondary | ICD-10-CM | POA: Diagnosis not present

## 2022-01-29 DIAGNOSIS — E668 Other obesity: Secondary | ICD-10-CM | POA: Diagnosis not present

## 2022-01-29 DIAGNOSIS — M069 Rheumatoid arthritis, unspecified: Secondary | ICD-10-CM | POA: Diagnosis not present

## 2022-01-29 DIAGNOSIS — Z6832 Body mass index (BMI) 32.0-32.9, adult: Secondary | ICD-10-CM | POA: Diagnosis not present

## 2022-02-22 ENCOUNTER — Encounter (HOSPITAL_BASED_OUTPATIENT_CLINIC_OR_DEPARTMENT_OTHER): Payer: Federal, State, Local not specified - PPO | Admitting: Cardiovascular Disease

## 2022-02-22 ENCOUNTER — Encounter (HOSPITAL_BASED_OUTPATIENT_CLINIC_OR_DEPARTMENT_OTHER): Payer: Self-pay

## 2022-02-22 NOTE — Progress Notes (Signed)
This encounter was created in error - please disregard.

## 2022-02-27 DIAGNOSIS — Z1389 Encounter for screening for other disorder: Secondary | ICD-10-CM | POA: Diagnosis not present

## 2022-02-27 DIAGNOSIS — Z6832 Body mass index (BMI) 32.0-32.9, adult: Secondary | ICD-10-CM | POA: Diagnosis not present

## 2022-02-27 DIAGNOSIS — M069 Rheumatoid arthritis, unspecified: Secondary | ICD-10-CM | POA: Diagnosis not present

## 2022-02-27 DIAGNOSIS — R000 Tachycardia, unspecified: Secondary | ICD-10-CM | POA: Diagnosis not present

## 2022-02-27 DIAGNOSIS — E785 Hyperlipidemia, unspecified: Secondary | ICD-10-CM | POA: Diagnosis not present

## 2022-02-27 DIAGNOSIS — I10 Essential (primary) hypertension: Secondary | ICD-10-CM | POA: Diagnosis not present

## 2022-02-27 DIAGNOSIS — Z713 Dietary counseling and surveillance: Secondary | ICD-10-CM | POA: Diagnosis not present

## 2022-02-27 DIAGNOSIS — E668 Other obesity: Secondary | ICD-10-CM | POA: Diagnosis not present

## 2022-02-27 DIAGNOSIS — E1169 Type 2 diabetes mellitus with other specified complication: Secondary | ICD-10-CM | POA: Diagnosis not present

## 2022-03-05 DIAGNOSIS — J029 Acute pharyngitis, unspecified: Secondary | ICD-10-CM | POA: Diagnosis not present

## 2022-03-21 ENCOUNTER — Encounter (INDEPENDENT_AMBULATORY_CARE_PROVIDER_SITE_OTHER): Payer: Self-pay

## 2022-04-02 DIAGNOSIS — Z6830 Body mass index (BMI) 30.0-30.9, adult: Secondary | ICD-10-CM | POA: Diagnosis not present

## 2022-04-02 DIAGNOSIS — E668 Other obesity: Secondary | ICD-10-CM | POA: Diagnosis not present

## 2022-04-02 DIAGNOSIS — E1169 Type 2 diabetes mellitus with other specified complication: Secondary | ICD-10-CM | POA: Diagnosis not present

## 2022-04-02 DIAGNOSIS — Z713 Dietary counseling and surveillance: Secondary | ICD-10-CM | POA: Diagnosis not present

## 2022-04-02 DIAGNOSIS — I10 Essential (primary) hypertension: Secondary | ICD-10-CM | POA: Diagnosis not present

## 2022-04-02 DIAGNOSIS — E785 Hyperlipidemia, unspecified: Secondary | ICD-10-CM | POA: Diagnosis not present

## 2022-04-02 DIAGNOSIS — M069 Rheumatoid arthritis, unspecified: Secondary | ICD-10-CM | POA: Diagnosis not present

## 2022-04-02 DIAGNOSIS — Z1389 Encounter for screening for other disorder: Secondary | ICD-10-CM | POA: Diagnosis not present

## 2022-04-13 DIAGNOSIS — H02834 Dermatochalasis of left upper eyelid: Secondary | ICD-10-CM | POA: Diagnosis not present

## 2022-04-17 DIAGNOSIS — H401231 Low-tension glaucoma, bilateral, mild stage: Secondary | ICD-10-CM | POA: Diagnosis not present

## 2022-05-01 DIAGNOSIS — Z6832 Body mass index (BMI) 32.0-32.9, adult: Secondary | ICD-10-CM | POA: Diagnosis not present

## 2022-05-01 DIAGNOSIS — E668 Other obesity: Secondary | ICD-10-CM | POA: Diagnosis not present

## 2022-05-01 DIAGNOSIS — M069 Rheumatoid arthritis, unspecified: Secondary | ICD-10-CM | POA: Diagnosis not present

## 2022-05-01 DIAGNOSIS — I10 Essential (primary) hypertension: Secondary | ICD-10-CM | POA: Diagnosis not present

## 2022-05-01 DIAGNOSIS — Z713 Dietary counseling and surveillance: Secondary | ICD-10-CM | POA: Diagnosis not present

## 2022-05-01 DIAGNOSIS — E785 Hyperlipidemia, unspecified: Secondary | ICD-10-CM | POA: Diagnosis not present

## 2022-05-01 DIAGNOSIS — Z1389 Encounter for screening for other disorder: Secondary | ICD-10-CM | POA: Diagnosis not present

## 2022-05-01 DIAGNOSIS — E1169 Type 2 diabetes mellitus with other specified complication: Secondary | ICD-10-CM | POA: Diagnosis not present

## 2022-05-02 DIAGNOSIS — H02132 Senile ectropion of right lower eyelid: Secondary | ICD-10-CM | POA: Diagnosis not present

## 2022-05-02 DIAGNOSIS — H02135 Senile ectropion of left lower eyelid: Secondary | ICD-10-CM | POA: Diagnosis not present

## 2022-06-04 DIAGNOSIS — I10 Essential (primary) hypertension: Secondary | ICD-10-CM | POA: Diagnosis not present

## 2022-06-04 DIAGNOSIS — E1169 Type 2 diabetes mellitus with other specified complication: Secondary | ICD-10-CM | POA: Diagnosis not present

## 2022-06-04 DIAGNOSIS — Z6830 Body mass index (BMI) 30.0-30.9, adult: Secondary | ICD-10-CM | POA: Diagnosis not present

## 2022-06-04 DIAGNOSIS — M069 Rheumatoid arthritis, unspecified: Secondary | ICD-10-CM | POA: Diagnosis not present

## 2022-06-04 DIAGNOSIS — E785 Hyperlipidemia, unspecified: Secondary | ICD-10-CM | POA: Diagnosis not present

## 2022-06-04 DIAGNOSIS — Z23 Encounter for immunization: Secondary | ICD-10-CM | POA: Diagnosis not present

## 2022-06-04 DIAGNOSIS — Z1389 Encounter for screening for other disorder: Secondary | ICD-10-CM | POA: Diagnosis not present

## 2022-06-04 DIAGNOSIS — Z713 Dietary counseling and surveillance: Secondary | ICD-10-CM | POA: Diagnosis not present

## 2022-06-04 DIAGNOSIS — E668 Other obesity: Secondary | ICD-10-CM | POA: Diagnosis not present

## 2022-06-07 ENCOUNTER — Other Ambulatory Visit: Payer: Self-pay | Admitting: Cardiovascular Disease

## 2022-06-07 NOTE — Telephone Encounter (Signed)
Rx(s) sent to pharmacy electronically.  

## 2022-06-25 DIAGNOSIS — E668 Other obesity: Secondary | ICD-10-CM | POA: Diagnosis not present

## 2022-06-25 DIAGNOSIS — K5904 Chronic idiopathic constipation: Secondary | ICD-10-CM | POA: Diagnosis not present

## 2022-06-25 DIAGNOSIS — M069 Rheumatoid arthritis, unspecified: Secondary | ICD-10-CM | POA: Diagnosis not present

## 2022-06-25 DIAGNOSIS — E785 Hyperlipidemia, unspecified: Secondary | ICD-10-CM | POA: Diagnosis not present

## 2022-06-25 DIAGNOSIS — Z6830 Body mass index (BMI) 30.0-30.9, adult: Secondary | ICD-10-CM | POA: Diagnosis not present

## 2022-06-25 DIAGNOSIS — Z1389 Encounter for screening for other disorder: Secondary | ICD-10-CM | POA: Diagnosis not present

## 2022-06-25 DIAGNOSIS — Z713 Dietary counseling and surveillance: Secondary | ICD-10-CM | POA: Diagnosis not present

## 2022-06-25 DIAGNOSIS — E1169 Type 2 diabetes mellitus with other specified complication: Secondary | ICD-10-CM | POA: Diagnosis not present

## 2022-07-11 DIAGNOSIS — Z01818 Encounter for other preprocedural examination: Secondary | ICD-10-CM | POA: Diagnosis not present

## 2022-07-18 ENCOUNTER — Telehealth: Payer: Self-pay | Admitting: *Deleted

## 2022-07-18 NOTE — Patient Outreach (Signed)
  Care Coordination   07/18/2022 Name: MANVI GUILLIAMS MRN: [id number] DOB: [birth year]   Care Coordination Outreach Attempts:  An unsuccessful telephone outreach was attempted today to offer the patient information about available care coordination services as a benefit of their health plan.   Follow Up Plan:  Additional outreach attempts will be made to offer the patient care coordination information and services.   Encounter Outcome:  No Answer   Care Coordination Interventions:  No, not indicated    Raina Mina, RN Care Management Coordinator [place] Office [phone]

## 2022-07-23 DIAGNOSIS — E1169 Type 2 diabetes mellitus with other specified complication: Secondary | ICD-10-CM | POA: Diagnosis not present

## 2022-07-23 DIAGNOSIS — I10 Essential (primary) hypertension: Secondary | ICD-10-CM | POA: Diagnosis not present

## 2022-07-23 DIAGNOSIS — Z1389 Encounter for screening for other disorder: Secondary | ICD-10-CM | POA: Diagnosis not present

## 2022-07-23 DIAGNOSIS — Z713 Dietary counseling and surveillance: Secondary | ICD-10-CM | POA: Diagnosis not present

## 2022-07-23 DIAGNOSIS — E785 Hyperlipidemia, unspecified: Secondary | ICD-10-CM | POA: Diagnosis not present

## 2022-07-23 DIAGNOSIS — Z6830 Body mass index (BMI) 30.0-30.9, adult: Secondary | ICD-10-CM | POA: Diagnosis not present

## 2022-07-23 DIAGNOSIS — E668 Other obesity: Secondary | ICD-10-CM | POA: Diagnosis not present

## 2022-07-23 DIAGNOSIS — M069 Rheumatoid arthritis, unspecified: Secondary | ICD-10-CM | POA: Diagnosis not present

## 2022-07-25 DIAGNOSIS — H02403 Unspecified ptosis of bilateral eyelids: Secondary | ICD-10-CM | POA: Diagnosis not present

## 2022-10-18 ENCOUNTER — Other Ambulatory Visit: Payer: Self-pay | Admitting: Cardiovascular Disease

## 2022-10-18 DIAGNOSIS — E1169 Type 2 diabetes mellitus with other specified complication: Secondary | ICD-10-CM

## 2022-10-18 DIAGNOSIS — I10 Essential (primary) hypertension: Secondary | ICD-10-CM

## 2022-10-18 NOTE — Telephone Encounter (Signed)
Rx request sent to pharmacy.  

## 2023-04-25 ENCOUNTER — Ambulatory Visit (HOSPITAL_BASED_OUTPATIENT_CLINIC_OR_DEPARTMENT_OTHER): Payer: Federal, State, Local not specified - PPO | Admitting: Cardiovascular Disease

## 2023-04-25 NOTE — Progress Notes (Deleted)
  Cardiology Office Note:  .   Date:  04/25/2023  ID:  Rebekah [name], DOB [birth year], MRN [id number] PCP: Mila Palmer, MD  [facility] Providers Cardiologist:  Chilton Si, MD { Click to update primary MD,subspecialty MD or APP then REFRESH:1}   History of Present Illness: .   Rebekah [name] is a 67 y.o. female with hypertension, hyperlipidemia, TIA, Rheumatoid arthritis and diabetes here for follow up.  She was initially seen 09/2017 for the evaluation of chest pain.  She presented to urgent care 05/2017 with chest pain.  At the time she reported exertional chest pain that occurred while walking on the treadmill or walking for exercise.  The episodes lasted for several hours at a time.  She felt as though her at the center of her chest hurt when she tried to take a deep breath. In the ED her EKG revealed LVH with repolarization abnormalities versus lateral ST elevations.  Cardiac enzymes were negative.  D-dimer was mildly elevated and she had recently flown across the country.  She had a chest CT that was negative for pulmonary embolism.  She followed up with Dr. Paulino Rily and was referred to cardiology given her risk factors.  She had an ETT 10/2017 that was negative for ischemia.  At her last appointment her blood pressure was stable but she was volume overloaded.  She was taking both HCTZ and Lasix.  Hydrochlorothiazide was discontinued and Lasix was increased.  Amlodipine was also increased to make sure her blood pressure stayed well have been controlled.    She reported recurrent episodes of L sided numbness. They were sporadic and lasted for a few minutes.  She saw neurology and was started on pregabalin. She denied any further episodes while on pregabalin. She saw her PCP and was started on lisinopril.  Her blood pressure had been somewhat labile. She had a cough on lisinopril so it was switched to losartan. She was referred to PREP and started on semaglutide. At her visit 01/2022  she was doing well with lifestyle changes and losing weight.   ROS: ***  Studies Reviewed: .        *** Risk Assessment/Calculations:   {Does this patient have ATRIAL FIBRILLATION?:[phone]} No BP recorded.  {Refresh Note OR Click here to enter BP  :1}***       Physical Exam:   VS:  LMP 08/13/1998    Wt Readings from Last 3 Encounters:  01/16/22 204 lb 6.4 oz (92.7 kg)  09/05/21 223 lb 9.6 oz (101.4 kg)  08/24/21 225 lb 9.6 oz (102.3 kg)    GEN: Well nourished, well developed in no acute distress NECK: No JVD; No carotid bruits CARDIAC: ***RRR, no murmurs, rubs, gallops RESPIRATORY:  Clear to auscultation without rales, wheezing or rhonchi  ABDOMEN: Soft, non-tender, non-distended EXTREMITIES:  No edema; No deformity   ASSESSMENT AND PLAN: .   ***    {Are you ordering a CV Procedure (e.g. stress test, cath, DCCV, TEE, etc)?   Press F2        :[id number]}  Dispo: ***  Signed, Chilton Si, MD

## 2023-09-23 ENCOUNTER — Ambulatory Visit (HOSPITAL_BASED_OUTPATIENT_CLINIC_OR_DEPARTMENT_OTHER): Payer: Federal, State, Local not specified - PPO | Admitting: Cardiovascular Disease

## 2023-09-23 ENCOUNTER — Encounter (HOSPITAL_BASED_OUTPATIENT_CLINIC_OR_DEPARTMENT_OTHER): Payer: Self-pay

## 2023-09-24 ENCOUNTER — Encounter (HOSPITAL_BASED_OUTPATIENT_CLINIC_OR_DEPARTMENT_OTHER): Payer: Self-pay | Admitting: Family

## 2023-09-24 ENCOUNTER — Ambulatory Visit (HOSPITAL_BASED_OUTPATIENT_CLINIC_OR_DEPARTMENT_OTHER): Payer: Federal, State, Local not specified - PPO | Admitting: Family

## 2023-09-24 ENCOUNTER — Other Ambulatory Visit: Payer: Self-pay | Admitting: Cardiovascular Disease

## 2023-09-24 VITALS — BP 124/74 | HR 82 | Ht 66.0 in | Wt 184.0 lb

## 2023-09-24 DIAGNOSIS — E782 Mixed hyperlipidemia: Secondary | ICD-10-CM | POA: Diagnosis not present

## 2023-09-24 DIAGNOSIS — Z6829 Body mass index (BMI) 29.0-29.9, adult: Secondary | ICD-10-CM | POA: Diagnosis not present

## 2023-09-24 DIAGNOSIS — I10 Essential (primary) hypertension: Secondary | ICD-10-CM

## 2023-09-24 DIAGNOSIS — R7303 Prediabetes: Secondary | ICD-10-CM | POA: Diagnosis not present

## 2023-09-24 NOTE — Progress Notes (Signed)
  Cardiology Office Note:  .   Date:  09/24/2023  ID:  Rebekah [name], DOB [birth year], MRN [id number] PCP: Mila Palmer, MD  [facility] Providers Cardiologist:  Chilton Si, MD    History of Present Illness: .   Rebekah [name] is a 68 y.o. female with history of hypertension, hyperlipidemia, TIA, rheumatoid arthritis, diabetes.  Established with cardiology 09/2017 for chest pain.  ETT 10/2017 negative for ischemia.  Lisinopril previously changed to losartan due to cough.  Last seen by Dr. Duke Salvia 01/2022.  Her BP was mildly elevated and she was encouraged to monitor at home.  Presents today for follow up independently. Successful weight loss with NFAOZH with her goal weight of 170-175 lbs. 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 Excited for upcoming birthday trip in April/May to [place], [place], and maybe [place].   Reports no shortness of breath nor dyspnea on exertion. Reports no chest pain, pressure, or tightness. No edema, orthopnea, PND. Reports no palpitations.    ROS: Please see the history of present illness.    All other systems reviewed and are negative.   Studies Reviewed: Marland Kitchen   EKG Interpretation Date/Time:  Tuesday September 24 2023 09:05:35 EST Ventricular Rate:  82 PR Interval:  194 QRS Duration:  90 QT Interval:  382 QTC Calculation: 446 R Axis:   40  Text Interpretation: Normal sinus rhythm Normal ECG Confirmed by Gillian Shields ([id number]) on 09/24/2023 9:25:08 AM    Cardiac Studies & Procedures   ______________________________________________________________________________________________   STRESS TESTS  EXERCISE TOLERANCE TEST (ETT) 10/22/2017  Narrative  There was no ST segment deviation noted during stress.  , 41sec - good exercise time  Low risk exercise treadmill test with no electrocardiographic evidence of ischemia. Normal  Donato Schultz, MD             ______________________________________________________________________________________________      Risk Assessment/Calculations:             Physical Exam:   VS:  BP 124/74 (BP Location: Left Arm, Patient Position: Sitting, Cuff Size: Large)   Pulse 82   Ht 5\' 6"  (1.676 m)   Wt 184 lb (83.5 kg)   LMP 08/13/1998   SpO2 98%   BMI 29.70 kg/m    Wt Readings from Last 3 Encounters:  09/24/23 184 lb (83.5 kg)  01/16/22 204 lb 6.4 oz (92.7 kg)  09/05/21 223 lb 9.6 oz (101.4 kg)    GEN: Well nourished, well developed in no acute distress NECK: No JVD; No carotid bruits CARDIAC: RRR, no murmurs, rubs, gallops RESPIRATORY:  Clear to auscultation without rales, wheezing or rhonchi  ABDOMEN: Soft, non-tender, non-distended EXTREMITIES:  No edema; No deformity   ASSESSMENT AND PLAN: .    HTN - BP well controlled. Continue current antihypertensive regimen Amlodipine-Valsartan 10-320mg  daily.   HLD - 12/2022 LDL 84. Continue Simvastatin per PCP.   Obesity - Had successful weight loss on WEgovy. Unofrtunately, no longer covered by her insurance. Following with primary care. Refer [facility] exercise facility. Encouraged to participate in [employer] Exerciser program.       Dispo: follow up in 1 year  Signed, Alver Sorrow, NP

## 2023-09-24 NOTE — Patient Instructions (Addendum)
Medication Instructions:  Continue your current medications.   *If you need a refill on your cardiac medications before your next appointment, please call your pharmacy*  Testing/Procedures: Your EKG today looked good and showed normal sinus rhythm.   Follow-Up: At [facility], you and your health needs are our priority.  As part of our continuing mission to provide you with exceptional heart care, we have created designated Provider Care Teams.  These Care Teams include your primary Cardiologist (physician) and Advanced Practice Providers (APPs -  Physician Assistants and Nurse Practitioners) who all work together to provide you with the care you need, when you need it.  We recommend signing up for the patient portal called "MyChart".  Sign up information is provided on this After Visit Summary.  MyChart is used to connect with patients for Virtual Visits (Telemedicine).  Patients are able to view lab/test results, encounter notes, upcoming appointments, etc.  Non-urgent messages can be sent to your provider as well.   To learn more about what you can do with MyChart, go to [email or web address].    Your next appointment:   In 1 year  Other Instructions  Heart Healthy Diet Recommendations: A low-salt diet is recommended. Meats should be grilled, baked, or boiled. Avoid fried foods. Focus on lean protein sources like fish or chicken with vegetables and fruits. The American Heart Association is a Chief Technology Officer!  American Heart Association Diet and Lifeystyle Recommendations   Exercise recommendations: The American Heart Association recommends 150 minutes of moderate intensity exercise weekly. Try 30 minutes of moderate intensity exercise 4-5 times per week. This could include walking, jogging, or swimming.

## 2023-09-24 NOTE — Addendum Note (Signed)
Addended by: Alver Sorrow on: 09/24/2023 09:50 AM   Modules accepted: Orders

## 2023-10-04 ENCOUNTER — Emergency Department (HOSPITAL_COMMUNITY): Payer: Federal, State, Local not specified - PPO

## 2023-10-04 ENCOUNTER — Observation Stay (HOSPITAL_COMMUNITY)
Admission: EM | Admit: 2023-10-04 | Discharge: 2023-10-05 | Disposition: A | Discharge status: Home / Self Care | Payer: Federal, State, Local not specified - PPO | Attending: Internal Medicine | Admitting: Internal Medicine

## 2023-10-04 ENCOUNTER — Other Ambulatory Visit: Payer: Self-pay

## 2023-10-04 DIAGNOSIS — R519 Headache, unspecified: Secondary | ICD-10-CM | POA: Diagnosis present

## 2023-10-04 DIAGNOSIS — R770 Abnormality of albumin: Secondary | ICD-10-CM | POA: Diagnosis not present

## 2023-10-04 DIAGNOSIS — E66811 Obesity, class 1: Secondary | ICD-10-CM | POA: Insufficient documentation

## 2023-10-04 DIAGNOSIS — I6200 Nontraumatic subdural hemorrhage, unspecified: Secondary | ICD-10-CM | POA: Diagnosis not present

## 2023-10-04 DIAGNOSIS — M069 Rheumatoid arthritis, unspecified: Secondary | ICD-10-CM | POA: Diagnosis not present

## 2023-10-04 DIAGNOSIS — M0579 Rheumatoid arthritis with rheumatoid factor of multiple sites without organ or systems involvement: Secondary | ICD-10-CM | POA: Diagnosis present

## 2023-10-04 DIAGNOSIS — S065XAA Traumatic subdural hemorrhage with loss of consciousness status unknown, initial encounter: Principal | ICD-10-CM | POA: Diagnosis present

## 2023-10-04 DIAGNOSIS — E119 Type 2 diabetes mellitus without complications: Secondary | ICD-10-CM | POA: Diagnosis not present

## 2023-10-04 DIAGNOSIS — I10 Essential (primary) hypertension: Secondary | ICD-10-CM | POA: Diagnosis not present

## 2023-10-04 DIAGNOSIS — R29898 Other symptoms and signs involving the musculoskeletal system: Secondary | ICD-10-CM

## 2023-10-04 DIAGNOSIS — Z87891 Personal history of nicotine dependence: Secondary | ICD-10-CM | POA: Diagnosis not present

## 2023-10-04 DIAGNOSIS — Z7982 Long term (current) use of aspirin: Secondary | ICD-10-CM | POA: Diagnosis not present

## 2023-10-04 DIAGNOSIS — R531 Weakness: Secondary | ICD-10-CM | POA: Diagnosis present

## 2023-10-04 DIAGNOSIS — E1165 Type 2 diabetes mellitus with hyperglycemia: Secondary | ICD-10-CM

## 2023-10-04 DIAGNOSIS — Z6831 Body mass index (BMI) 31.0-31.9, adult: Secondary | ICD-10-CM | POA: Insufficient documentation

## 2023-10-04 DIAGNOSIS — K279 Peptic ulcer, site unspecified, unspecified as acute or chronic, without hemorrhage or perforation: Secondary | ICD-10-CM | POA: Diagnosis present

## 2023-10-04 DIAGNOSIS — Z79899 Other long term (current) drug therapy: Secondary | ICD-10-CM | POA: Insufficient documentation

## 2023-10-04 DIAGNOSIS — Z7984 Long term (current) use of oral hypoglycemic drugs: Secondary | ICD-10-CM | POA: Insufficient documentation

## 2023-10-04 LAB — CBC WITH DIFFERENTIAL/PLATELET
Abs Immature Granulocytes: 0.02 10*3/uL (ref 0.00–0.07)
Basophils Absolute: 0 10*3/uL (ref 0.0–0.1)
Basophils Relative: 1 %
Eosinophils Absolute: 0.1 10*3/uL (ref 0.0–0.5)
Eosinophils Relative: 2 %
HCT: 45.4 % (ref 36.0–46.0)
Hemoglobin: 14 g/dL (ref 12.0–15.0)
Immature Granulocytes: 0 %
Lymphocytes Relative: 33 %
Lymphs Abs: 1.7 10*3/uL (ref 0.7–4.0)
MCH: 29.9 pg (ref 26.0–34.0)
MCHC: 30.8 g/dL (ref 30.0–36.0)
MCV: 96.8 fL (ref 80.0–100.0)
Monocytes Absolute: 0.4 10*3/uL (ref 0.1–1.0)
Monocytes Relative: 8 %
Neutro Abs: 3 10*3/uL (ref 1.7–7.7)
Neutrophils Relative %: 56 %
Platelets: 236 10*3/uL (ref 150–400)
RBC: 4.69 MIL/uL (ref 3.87–5.11)
RDW: 13.2 % (ref 11.5–15.5)
WBC: 5.3 10*3/uL (ref 4.0–10.5)
nRBC: 0 % (ref 0.0–0.2)

## 2023-10-04 LAB — COMPREHENSIVE METABOLIC PANEL
ALT: 15 U/L (ref 0–44)
AST: 24 U/L (ref 15–41)
Albumin: 3.7 g/dL (ref 3.5–5.0)
Alkaline Phosphatase: 80 U/L (ref 38–126)
Anion gap: 13 (ref 5–15)
BUN: 19 mg/dL (ref 8–23)
CO2: 21 mmol/L — ABNORMAL LOW (ref 22–32)
Calcium: 9.5 mg/dL (ref 8.9–10.3)
Chloride: 106 mmol/L (ref 98–111)
Creatinine, Ser: 0.75 mg/dL (ref 0.44–1.00)
GFR, Estimated: >60 mL/min (ref >60)
Glucose, Bld: 72 mg/dL (ref 70–99)
Potassium: 3.7 mmol/L (ref 3.5–5.1)
Sodium: 140 mmol/L (ref 135–145)
Total Bilirubin: 0.3 mg/dL (ref 0.0–1.2)
Total Protein: 7.1 g/dL (ref 6.5–8.1)

## 2023-10-04 LAB — TSH
TSH: 1.044 u[IU]/mL (ref 0.350–4.500)
TSH: 2.147 u[IU]/mL (ref 0.350–4.500)

## 2023-10-04 LAB — HEMOGLOBIN A1C
Hgb A1c MFr Bld: 5 % (ref 4.8–5.6)
Mean Plasma Glucose: 96.8 mg/dL

## 2023-10-04 LAB — GLUCOSE, CAPILLARY: Glucose-Capillary: 108 mg/dL — ABNORMAL HIGH (ref 70–99)

## 2023-10-04 LAB — HIV ANTIBODY (ROUTINE TESTING W REFLEX): HIV Screen 4th Generation wRfx: NONREACTIVE

## 2023-10-04 MED ORDER — PANTOPRAZOLE SODIUM 40 MG IV SOLR
40.0000 mg | Freq: Two times a day (BID) | INTRAVENOUS | Status: DC
  Administered 2023-10-04 – 2023-10-05 (×3): 40 mg via INTRAVENOUS
  Filled 2023-10-04 (×3): qty 10

## 2023-10-04 MED ORDER — GADOBUTROL 1 MMOL/ML IV SOLN
8.0000 mL | Freq: Once | INTRAVENOUS | Status: AC | PRN
  Administered 2023-10-04: 8 mL via INTRAVENOUS

## 2023-10-04 MED ORDER — SODIUM CHLORIDE 0.9 % IV SOLN: Freq: Once | INTRAVENOUS | Status: AC

## 2023-10-04 MED ORDER — IOHEXOL 350 MG/ML SOLN
75.0000 mL | Freq: Once | INTRAVENOUS | Status: AC | PRN
  Administered 2023-10-04: 75 mL via INTRAVENOUS

## 2023-10-04 MED ORDER — AMLODIPINE BESYLATE 5 MG PO TABS
5.0000 mg | ORAL_TABLET | Freq: Every day | ORAL | Status: DC
  Administered 2023-10-05: 5 mg via ORAL
  Filled 2023-10-04: qty 1

## 2023-10-04 MED ORDER — DIPHENHYDRAMINE HCL 50 MG/ML IJ SOLN
50.0000 mg | Freq: Once | INTRAMUSCULAR | Status: AC
  Administered 2023-10-04: 50 mg via INTRAVENOUS
  Filled 2023-10-04: qty 1

## 2023-10-04 MED ORDER — IRBESARTAN 300 MG PO TABS
300.0000 mg | ORAL_TABLET | Freq: Every day | ORAL | Status: DC
  Administered 2023-10-05: 300 mg via ORAL
  Filled 2023-10-04: qty 1

## 2023-10-04 MED ORDER — HYDRALAZINE HCL 20 MG/ML IJ SOLN: 5.0000 mg | INTRAMUSCULAR | Status: DC | PRN

## 2023-10-04 MED ORDER — PROCHLORPERAZINE EDISYLATE 10 MG/2ML IJ SOLN
10.0000 mg | Freq: Once | INTRAMUSCULAR | Status: AC
  Administered 2023-10-04: 10 mg via INTRAVENOUS
  Filled 2023-10-04: qty 2

## 2023-10-04 MED ORDER — SODIUM CHLORIDE 0.9% FLUSH
3.0000 mL | Freq: Two times a day (BID) | INTRAVENOUS | Status: DC
  Administered 2023-10-04 – 2023-10-05 (×3): 3 mL via INTRAVENOUS

## 2023-10-04 MED ORDER — ACETAMINOPHEN 325 MG PO TABS
650.0000 mg | ORAL_TABLET | Freq: Four times a day (QID) | ORAL | Status: DC | PRN
  Filled 2023-10-04: qty 2

## 2023-10-04 MED ORDER — ACETAMINOPHEN 650 MG RE SUPP: 650.0000 mg | Freq: Four times a day (QID) | RECTAL | Status: DC | PRN

## 2023-10-04 MED ORDER — INSULIN ASPART 100 UNIT/ML IJ SOLN: 0.0000 [IU] | Freq: Three times a day (TID) | INTRAMUSCULAR | Status: DC

## 2023-10-04 MED ORDER — AMLODIPINE BESYLATE-VALSARTAN 5-320 MG PO TABS: 1.0000 | ORAL_TABLET | Freq: Every day | ORAL | Status: DC

## 2023-10-04 NOTE — Assessment & Plan Note (Signed)
 Vitals:   10/04/23 0812  BP: 134/76  Will continue patient on her amlodipine and valsartan hold Lasix.

## 2023-10-04 NOTE — ED Provider Notes (Signed)
 [place] [facility] AT [facility] Provider Note   CSN: [id number] Arrival date & time: 10/04/23  4098     History  Chief Complaint  Patient presents with   Weakness    Rebekah [name] is a 68 y.o. female.  The history is provided by the patient and medical records.  Weakness Severity:  Unable to specify Onset quality:  Gradual Timing:  Unable to specify Progression:  Resolved Chronicity:  New Relieved by:  Nothing Worsened by:  Nothing Ineffective treatments:  None tried Associated symptoms: headaches, sensory-motor deficit and stroke symptoms   Associated symptoms: no abdominal pain, no aphasia, no chest pain, no cough, no diarrhea, no dizziness, no dysuria, no falls, no fever, no foul-smelling urine, no frequency, no loss of consciousness, no myalgias, no nausea, no near-syncope, no shortness of breath, no vision change and no vomiting        Home Medications Prior to Admission medications   Medication Sig Start Date End Date Taking? Authorizing Provider  albuterol (VENTOLIN HFA) 108 (90 Base) MCG/ACT inhaler Inhale 2 puffs into the lungs every 4 (four) hours. Patient not taking: Reported on 09/24/2023 03/13/21   [provider]  amLODipine-valsartan (EXFORGE) 5-320 MG tablet Take 1 tablet by mouth daily.    [provider]  aspirin 81 MG chewable tablet Chew 81 mg by mouth daily.    [provider]  Cholecalciferol 25 MCG (1000 UT) capsule cholecalciferol (vitamin D3) 25 mcg (1,000 unit) capsule  TAKE ONE CAPSULE BY MOUTH EVERY DAY    [provider]  estradiol (ESTRACE) 0.1 MG/GM vaginal cream estradiol 0.01% (0.1 mg/gram) vaginal cream  Insert 0.5 g twice a week by vaginal route.    [provider]  estradiol (ESTRACE) 2 MG tablet estradiol 2 mg tablet  Take 1 tablet every day by oral route.    [provider]  EUCRISA 2 % OINT Apply 1 application topically 2 (two) times daily. 03/20/21    [provider]  furosemide (LASIX) 40 MG tablet TAKE 1 TABLET BY MOUTH EVERY DAY 06/05/21   Chilton Si, MD  leflunomide (ARAVA) 20 MG tablet Take 20 mg by mouth daily. 01/12/16   [provider]  lubiprostone (AMITIZA) 24 MCG capsule Take 1 capsule (24 mcg total) by mouth 2 (two) times daily as needed. 05/12/19   Corinna Capra A, DO  metFORMIN (GLUCOPHAGE-XR) 750 MG 24 hr tablet SMARTSIG:2 Tablet(s) By Mouth Every Evening 11/06/21   [provider]  pregabalin (LYRICA) 100 MG capsule TAKE 1 CAPSULE BY MOUTH TWICE A DAY 07/18/21   Anson Fret, MD  simvastatin (ZOCOR) 40 MG tablet TAKE 1 TABLET BY MOUTH EVERY DAY IN THE EVENING 09/24/23   Alver Sorrow, NP  VYVANSE 30 MG capsule Take 30 mg by mouth every morning. 01/14/22   [provider]      Allergies    Lisinopril and Crestor [rosuvastatin]    Review of Systems   Review of Systems  Constitutional:  Negative for chills, fatigue and fever.  HENT:  Negative for congestion.   Eyes:  Negative for visual disturbance.  Respiratory:  Negative for cough, chest tightness, shortness of breath and wheezing.   Cardiovascular:  Negative for chest pain, palpitations and near-syncope.  Gastrointestinal:  Negative for abdominal pain, constipation, diarrhea, nausea and vomiting.  Genitourinary:  Negative for dysuria, flank pain and frequency.  Musculoskeletal:  Negative for back pain, falls, myalgias, neck pain and neck stiffness.  Skin:  Negative for rash.  Neurological:  Positive for weakness and headaches. Negative for dizziness, loss of consciousness, syncope, speech difficulty, light-headedness and numbness.  Psychiatric/Behavioral:  Negative for agitation.   All other systems reviewed and are negative.   Physical Exam Updated Vital Signs BP 134/76 (BP Location: Right Arm)   Pulse 87   Temp 97.8 F (36.6 C) (Oral)   Resp 16   LMP 08/13/1998  Physical Exam Vitals and nursing note reviewed.   Constitutional:      General: She is not in acute distress.    Appearance: She is well-developed. She is not ill-appearing, toxic-appearing or diaphoretic.  HENT:     Head: Normocephalic and atraumatic.     Right Ear: External ear normal.     Left Ear: External ear normal.     Nose: Nose normal. No rhinorrhea.     Mouth/Throat:     Mouth: Mucous membranes are moist.     Pharynx: No oropharyngeal exudate or posterior oropharyngeal erythema.  Eyes:     Extraocular Movements: Extraocular movements intact.     Conjunctiva/sclera: Conjunctivae normal.     Pupils: Pupils are equal, round, and reactive to light.  Neck:     Vascular: No carotid bruit.  Cardiovascular:     Rate and Rhythm: Normal rate.     Pulses: Normal pulses.     Heart sounds: No murmur heard. Pulmonary:     Effort: No respiratory distress.     Breath sounds: No stridor. No wheezing, rhonchi or rales.  Chest:     Chest wall: No tenderness.  Abdominal:     General: There is no distension.     Tenderness: There is no abdominal tenderness. There is no right CVA tenderness, left CVA tenderness, guarding or rebound.  Musculoskeletal:        General: No tenderness.     Cervical back: Normal range of motion and neck supple. No tenderness.     Right lower leg: No edema.     Left lower leg: No edema.  Skin:    General: Skin is warm.     Findings: No erythema or rash.  Neurological:     General: No focal deficit present.     Mental Status: She is alert and oriented to person, place, and time. Mental status is at baseline.     Cranial Nerves: No dysarthria or facial asymmetry.     Sensory: No sensory deficit.     Motor: No weakness, tremor, abnormal muscle tone or seizure activity.     Coordination: Finger-Nose-Finger Test and Heel to [place] Test normal.     Deep Tendon Reflexes: Reflexes are normal and symmetric.     ED Results / Procedures / Treatments   Labs (all labs ordered are listed, but only abnormal results  are displayed) Labs Reviewed  COMPREHENSIVE METABOLIC PANEL - Abnormal; Notable for the following components:      Result Value   CO2 21 (*)    All other components within normal limits  CBC WITH DIFFERENTIAL/PLATELET  TSH    EKG EKG Interpretation Date/Time:  Friday October 04 2023 08:15:39 EST Ventricular Rate:  86 PR Interval:  203 QRS Duration:  92 QT Interval:  374 QTC Calculation: 448 R Axis:   11  Text Interpretation: Sinus rhythm Minimal ST elevation, anterior leads when compared to prior, similar appearance. No STEMI Confirmed by Theda Belfast ([id number]) on 10/04/2023 8:45:53 AM  Radiology MR Brain W and Wo Contrast Result Date: 10/04/2023 CLINICAL DATA:  Neuro deficit, acute, stroke suspected Right arm and right leg coordination problem and weakness this morning. Reports last few weeks had worsening bilateral arm tingling issues. Previous MRI showed abnormalities of head and neck. EXAM: MRI HEAD WITHOUT AND WITH CONTRAST TECHNIQUE: Multiplanar, multiecho pulse sequences of the brain and surrounding structures were obtained without and with intravenous contrast. CONTRAST:  8mL GADAVIST GADOBUTROL 1 MMOL/ML IV SOLN COMPARISON:  MRI/MRA brain 01/01/2021. FINDINGS: Brain: Acute appearing left convexity subdural hematoma measuring up to 10 mm in thickness (coronal image 17 series 9). Mild mass effect on the underlying parenchyma of the posterior left frontal and parietal lobes. No midline shift. No acute infarct. Background of moderate chronic small-vessel disease, unchanged from prior. No abnormal parenchymal susceptibility. Vascular: Normal flow voids. Skull and upper cervical spine: Normal marrow signal. Sinuses/Orbits: Negative. Other: None. IMPRESSION: Acute appearing left convexity subdural hematoma measuring up to 10 mm in thickness. Mild mass effect on the underlying parenchyma of the posterior left frontal and parietal lobes. No midline shift. Critical Value/emergent results were  called by telephone at the time of interpretation on 10/04/2023 at 12:24 pm to provider [facility] , who verbally acknowledged these results. Electronically Signed   By: Orvan Falconer M.D.   On: 10/04/2023 12:25    Procedures Procedures    CRITICAL CARE Performed by: Canary Brim Terie Lear Total critical care time: 35 minutes Critical care time was exclusive of separately billable procedures and treating other patients. Critical care was necessary to treat or prevent imminent or life-threatening deterioration. Critical care was time spent personally by me on the following activities: development of treatment plan with patient and/or surrogate as well as nursing, discussions with consultants, evaluation of patient's response to treatment, examination of patient, obtaining history from patient or surrogate, ordering and performing treatments and interventions, ordering and review of laboratory studies, ordering and review of radiographic studies, pulse oximetry and re-evaluation of patient's condition.  Medications Ordered in ED Medications  prochlorperazine (COMPAZINE) injection 10 mg (10 mg Intravenous Given 10/04/23 1119)  diphenhydrAMINE (BENADRYL) injection 50 mg (50 mg Intravenous Given 10/04/23 1118)  gadobutrol (GADAVIST) 1 MMOL/ML injection 8 mL (8 mLs Intravenous Contrast Given 10/04/23 1213)    ED Course/ Medical Decision Making/ A&P                                 Medical Decision Making Amount and/or Complexity of Data Reviewed Labs: ordered. Radiology: ordered.  Risk Prescription drug management. Decision regarding hospitalization.    Rebekah [name] is a 68 y.o. female with a past medical history significant for hypertension, hypercholesterolemia, diabetes, rheumatoid arthritis, hyperlipidemia, chronic neuropathy problems, and documentation of previous TIA who presents with several days of headache and new neurologic problems morning.  According to patient,  she has had headache for about 3 days that is moderate to severe.  It is 8 out of 10 severity now.  She reports she was last normal at 3 AM when she went to bed and then woke up with trouble with her right arm and right leg.  She is right-handed.  She reports she could not grip with her right arm well and she said that her right arm and right leg were not cooperating like they should.  It was more of a coordination issue she reported.  She reported she did not have a fall and otherwise has not had any recent neck manipulation, neck massage, or chiropractor  management in her neck.  She denies any speech changes or vision changes.  Denies any true dizziness.  She denies any other symptoms such as palpitations, chest pain, shortness of breath, nausea, vomiting, constipation, diarrhea, or urinary changes.  She does states she has had headache for several days.  She reports that over the last few weeks has had more tingling in both of her arms for which she is scheduled to see her neurologist on Wednesday.  She reports that by the time she got the emergency department, her symptoms have resolved and she is back to her baseline now.  On exam, lungs are clear.  Chest nontender.  Abdomen nontender.  Neck nontender and I do not appreciate a carotid bruit.  Pupils are symmetric and reactive with normal extraocular movements.  Symmetric smile.  Clear speech.  Normal finger-nose-finger testing.  Intact sensation strength and pulses in all extremities.  Normal heel shin testing.  Patient resting comfortably without acute distress.  EKG does not show STEMI.  Due to her history of neurologic issues, I did speak to neurology who helped guide the imaging workup.  Patient will get CTA head and neck and will also get CTV given the headache component of her neurologic problem.  Will also get MRI brain with and without given her history of abnormal MRI and history of TIA.  Will give a headache cocktail followed wait for her  workup to complete and we will reassess to determine disposition after workup.  12:32 PM Was called by radiology that patient has a 10 mm subdural on the left side with some mass effect but no midline shift.  She is still waiting on the CT imaging.  Will call neurosurgery.  I reassessed the patient now she is back from MRI and she still has intact sensation and strength in all extremities.  Symmetric smile and clear speech.  Pupil still symmetric and reactive.  She is more sleepy and somnolent however this may have been from the headache cocktail given earlier.  Will call neurosurgery and discuss a plan.  12:48 PM Just poke to Dr. Lovell Sheehan with neurosurgery who reviewed the images.  They do not feel this is a surgical problem at this time.  They will come see the patient but recommend admission to medicine service and get a repeat CT tomorrow.  If something was to change or worsen acutely they would be available for callback.  Will call medicine for admission for further management of transient neurologic deficits in the setting of new nontraumatic subdural hematoma with associated headaches.  Medicine will admit, CT scans have not yet been completed.        Final Clinical Impression(s) / ED Diagnoses Final diagnoses:  Subdural hematoma (HCC)  Acute nonintractable headache, unspecified headache type  Transient weakness of right lower extremity  Right arm weakness    Clinical Impression: 1. Subdural hematoma (HCC)   2. Acute nonintractable headache, unspecified headache type   3. Transient weakness of right lower extremity   4. Right arm weakness     Disposition: Admit  This note was prepared with assistance of Dragon voice recognition software. Occasional wrong-word or sound-a-like substitutions may have occurred due to the inherent limitations of voice recognition software.     Carsyn Boster, Canary Brim, MD 10/04/23 1310

## 2023-10-04 NOTE — Assessment & Plan Note (Signed)
 Suspect secondary to poorly controlled blood pressures.  Greatly appreciate neurosurgery consulting on patient.

## 2023-10-04 NOTE — Assessment & Plan Note (Signed)
 N.p.o. except sips, if patient passes bedside swallow can start patient on a carb consistent cardiac diet.

## 2023-10-04 NOTE — H&P (Signed)
 History and Physical    Patient: Rebekah [name] ZOX:[id number] DOB: [birth year] DOA: 10/04/2023 DOS: the patient was seen and examined on 10/04/2023 PCP: Mila Palmer, MD  Patient coming from: Home Chief complaint: Chief Complaint  Patient presents with   Weakness   HPI:  Rebekah [name] is a 68 y.o. female with past medical history  of   hypertension, hyperlipidemia, TIA, Rheumatoid arthritis and diabetes here for headache. Pt told edmd that she thinks she has a bleed.  Pt states she has had a headache since Monday. She has has TIA in past.  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  Home med list shows albuterol, amlodipine and valsartan, estradiol tablet, Lasix, Amitiza, Areva, metformin, Lyrica, Zocor, Vyvanse.  Noted allergy to Crestor and lisinopril.  Per report patient woke up and was feeling weak and noted that she was leaning towards her right side more so and had weakness on the right side upon waking.  >>ED Course: In emergency room patient is alert awake oriented afebrile in no distress spouse at bedside patient gives history. Vitals:   10/04/23 0812 10/04/23 0900 10/04/23 1000 10/04/23 1235  BP: 134/76     Pulse: 87 (!) 106 (!) 119   Temp: 97.8 F (36.6 C)   97.8 F (36.6 C)  Resp: 16 19 18    SpO2:  100% 98%   TempSrc: Oral   Oral  Initial EKG sinus rhythm at 86 PR of 203 QTc of 448.EKG shows ST elevation in lead V1 and V2 that is minimal.  ED evaluation  so far shows: CMP within normal limits. CBC within normal limits. CTA head and neck shows left cerebral SDH similar to MRI showing Acute appearing left convexity subdural hematoma measuring up to 10 mm in thickness (coronal image 17 series 9). Mild mass effect on the underlying parenchyma of the posterior left frontal and parietal lobes. No midline  shift. Patient was seen by neurosurgery in the emergency room  In the emergency room  pt has received the following treatment thus far: Medications  prochlorperazine (COMPAZINE) injection 10 mg (10 mg Intravenous Given 10/04/23 1119)  diphenhydrAMINE (BENADRYL) injection 50 mg (50 mg Intravenous Given 10/04/23 1118)  gadobutrol (GADAVIST) 1 MMOL/ML injection 8 mL (8 mLs Intravenous Contrast Given 10/04/23 1213)   Review of Systems  Neurological:  Positive for weakness and headaches.  All other systems reviewed and are negative.  Past Medical History:  Diagnosis Date   Arthritis    Atrial tachycardia (HCC) 01/17/2020   Diabetes mellitus type 2 in obese 10/10/2017   Essential hypertension 10/10/2017   Hypertension    Morbid obesity (HCC) 10/10/2017   Pure hypercholesterolemia 10/10/2017   Rheumatoid arteritis (HCC)    Vitamin D deficiency    Past Surgical History:  Procedure Laterality Date   ABDOMINAL HYSTERECTOMY     biopsy on tongue  1976   BREAST EXCISIONAL BIOPSY Left    CESAREAN SECTION     TUBAL LIGATION      reports that she quit smoking about 2 years ago. Her smoking use included cigarettes. She has never used smokeless tobacco. She reports current alcohol use. She reports that she does not use drugs.  Allergies  Allergen Reactions   Lisinopril Cough   Crestor [Rosuvastatin] Other (See Comments)    myalgias    Family History  Problem Relation Age of Onset   CAD Mother    Cerebral aneurysm Father    Stroke Sister    CVA Sister    Heart attack Maternal Grandmother    Stroke Maternal Grandmother    Alzheimer's disease Maternal Grandfather    Cancer Paternal Grandmother     Prior to Admission medications   Medication Sig Start Date End Date Taking? Authorizing Provider  albuterol (VENTOLIN HFA) 108 (90 Base) MCG/ACT inhaler Inhale 2 puffs into the lungs every 4 (four) hours. Patient not taking: Reported on 09/24/2023 03/13/21   [provider]   amLODipine-valsartan (EXFORGE) 5-320 MG tablet Take 1 tablet by mouth daily.    [provider]  aspirin 81 MG chewable tablet Chew 81 mg by mouth daily.    [provider]  Cholecalciferol 25 MCG (1000 UT) capsule cholecalciferol (vitamin D3) 25 mcg (1,000 unit) capsule  TAKE ONE CAPSULE BY MOUTH EVERY DAY    [provider]  estradiol (ESTRACE) 0.1 MG/GM vaginal cream estradiol 0.01% (0.1 mg/gram) vaginal cream  Insert 0.5 g twice a week by vaginal route.    [provider]  estradiol (ESTRACE) 2 MG tablet estradiol 2 mg tablet  Take 1 tablet every day by oral route.    [provider]  EUCRISA 2 % OINT Apply 1 application topically 2 (two) times daily. 03/20/21   [provider]  furosemide (LASIX) 40 MG tablet TAKE 1 TABLET BY MOUTH EVERY DAY 06/05/21   Chilton Si, MD  leflunomide (ARAVA) 20 MG tablet Take 20 mg by mouth daily. 01/12/16   [provider]  lubiprostone (AMITIZA) 24 MCG capsule Take 1 capsule (24 mcg total) by mouth 2 (two) times daily as needed. 05/12/19   Corinna Capra A, DO  metFORMIN (GLUCOPHAGE-XR) 750 MG 24 hr tablet SMARTSIG:2 Tablet(s) By Mouth Every Evening 11/06/21   [provider]  pregabalin (LYRICA) 100 MG capsule TAKE 1 CAPSULE BY MOUTH TWICE A DAY 07/18/21   Anson Fret, MD  simvastatin (ZOCOR) 40 MG tablet TAKE 1 TABLET BY MOUTH EVERY DAY IN THE EVENING 09/24/23   Alver Sorrow, NP  VYVANSE 30 MG capsule Take 30 mg by mouth every morning. 01/14/22   [provider]     Vitals:   10/04/23 1610 10/04/23 0900 10/04/23 1000 10/04/23 1235  BP: 134/76     Pulse: 87 (!) 106 (!) 119   Resp: 16 19 18    Temp: 97.8 F (36.6 C)   97.8 F (36.6 C)  TempSrc: Oral   Oral  SpO2:  100% 98%    Physical Exam Vitals and nursing note reviewed.  Constitutional:      General: She is not in acute distress.    Appearance: She is not ill-appearing.  HENT:     Head: Normocephalic and  atraumatic.     Right Ear: Hearing and external ear normal.     Left Ear: Hearing and external ear normal.     Nose: Nose normal. No nasal deformity.     Mouth/Throat:     Lips: Pink.     Tongue: No lesions.     Pharynx: Oropharynx is clear.  Eyes:     General: Lids are normal.     Extraocular Movements: Extraocular movements intact.     Comments: Pupils 2 mm bl.   Neck:     Vascular: No carotid bruit or JVD.  Cardiovascular:     Rate and Rhythm: Normal rate and regular rhythm.  Pulses:          Dorsalis pedis pulses are 2+ on the right side and 2+ on the left side.       Posterior tibial pulses are 2+ on the right side and 2+ on the left side.     Heart sounds: Murmur heard.     Systolic murmur is present with a grade of 2/6.  Pulmonary:     Effort: Pulmonary effort is normal.     Breath sounds: Normal breath sounds. No wheezing.  Abdominal:     General: Bowel sounds are normal. There is no distension.     Palpations: Abdomen is soft. There is no mass.     Tenderness: There is no abdominal tenderness.  Musculoskeletal:     Right lower leg: No edema.     Left lower leg: No edema.  Skin:    General: Skin is warm.  Neurological:     General: No focal deficit present.     Mental Status: She is alert and oriented to person, place, and time.     Cranial Nerves: Cranial nerves 2-12 are intact. No cranial nerve deficit, dysarthria or facial asymmetry.     Motor: No weakness, tremor or abnormal muscle tone.     Coordination: Coordination normal. Finger-Nose-Finger Test normal.     Deep Tendon Reflexes:     Reflex Scores:      Bicep reflexes are 2+ on the right side and 1+ on the left side.      Patellar reflexes are 2+ on the right side and 2+ on the left side. Psychiatric:        Attention and Perception: Attention normal.        Mood and Affect: Mood normal.        Speech: Speech normal.        Behavior: Behavior normal. Behavior is cooperative.      Labs on  Admission: I have personally reviewed following labs and imaging studies Results for orders placed or performed during the hospital encounter of 10/04/23 (from the past 24 hours)  CBC with Differential     Status: None   Collection Time: 10/04/23 10:30 AM  Result Value Ref Range   WBC 5.3 4.0 - 10.5 K/uL   RBC 4.69 3.87 - 5.11 MIL/uL   Hemoglobin 14.0 12.0 - 15.0 g/dL   HCT 09.8 11.9 - 14.7 %   MCV 96.8 80.0 - 100.0 fL   MCH 29.9 26.0 - 34.0 pg   MCHC 30.8 30.0 - 36.0 g/dL   RDW 82.9 56.2 - 13.0 %   Platelets 236 150 - 400 K/uL   nRBC 0.0 0.0 - 0.2 %   Neutrophils Relative % 56 %   Neutro Abs 3.0 1.7 - 7.7 K/uL   Lymphocytes Relative 33 %   Lymphs Abs 1.7 0.7 - 4.0 K/uL   Monocytes Relative 8 %   Monocytes Absolute 0.4 0.1 - 1.0 K/uL   Eosinophils Relative 2 %   Eosinophils Absolute 0.1 0.0 - 0.5 K/uL   Basophils Relative 1 %   Basophils Absolute 0.0 0.0 - 0.1 K/uL   Immature Granulocytes 0 %   Abs Immature Granulocytes 0.02 0.00 - 0.07 K/uL  Comprehensive metabolic panel     Status: Abnormal   Collection Time: 10/04/23 10:30 AM  Result Value Ref Range   Sodium 140 135 - 145 mmol/L   Potassium 3.7 3.5 - 5.1 mmol/L   Chloride 106 98 - 111 mmol/L   CO2  21 (L) 22 - 32 mmol/L   Glucose, Bld 72 70 - 99 mg/dL   BUN 19 8 - 23 mg/dL   Creatinine, Ser 5.78 0.44 - 1.00 mg/dL   Calcium 9.5 8.9 - 46.9 mg/dL   Total Protein 7.1 6.5 - 8.1 g/dL   Albumin 3.7 3.5 - 5.0 g/dL   AST 24 15 - 41 U/L   ALT 15 0 - 44 U/L   Alkaline Phosphatase 80 38 - 126 U/L   Total Bilirubin 0.3 0.0 - 1.2 mg/dL   GFR, Estimated >62 >95 mL/min   Anion gap 13 5 - 15  TSH     Status: None   Collection Time: 10/04/23 10:31 AM  Result Value Ref Range   TSH 1.044 0.350 - 4.500 uIU/mL   No results found for this or any previous visit (from the past 720 hours). CBC:    Latest Ref Rng & Units 10/04/2023   10:30 AM 12/23/2020   10:18 AM 05/28/2017   11:17 AM  CBC  WBC 4.0 - 10.5 K/uL 5.3  4.2  9.7    Hemoglobin 12.0 - 15.0 g/dL 28.4  13.2  44.0   Hematocrit 36.0 - 46.0 % 45.4  41.2  36.8   Platelets 150 - 400 K/uL 236  242  219    Basic Metabolic Panel: Recent Labs  Lab 10/04/23 1030  NA 140  K 3.7  CL 106  CO2 21*  GLUCOSE 72  BUN 19  CREATININE 0.75  CALCIUM 9.5   Creatinine: Lab Results  Component Value Date   CREATININE 0.75 10/04/2023   CREATININE 0.69 07/12/2021   CREATININE 0.76 12/23/2020   Liver Function Tests:    Latest Ref Rng & Units 10/04/2023   10:30 AM 12/23/2020   10:18 AM  Hepatic Function  Total Protein 6.5 - 8.1 g/dL 7.1  8.2   Albumin 3.5 - 5.0 g/dL 3.7  4.4   AST 15 - 41 U/L 24  14   ALT 0 - 44 U/L 15  15   Alk Phosphatase 38 - 126 U/L 80  107   Total Bilirubin 0.0 - 1.2 mg/dL 0.3  0.4     Radiological Exams on Admission: CT ANGIO HEAD NECK W WO CM Result Date: 10/04/2023 CLINICAL DATA:  Neuro deficit, acute, stroke suspected Transient right arm and right leg weakness/coordination problem this morning. Now resolved. Also new headache over the last few days.; Dural venous sinus thrombosis suspected EXAM: CT ANGIOGRAPHY HEAD AND NECK WITH AND WITHOUT CONTRAST CT VENOGRAM HEAD TECHNIQUE: Multidetector CT imaging of the head and neck was performed using the standard protocol during bolus administration of intravenous contrast. Multiplanar CT image reconstructions and MIPs were obtained to evaluate the vascular anatomy. Carotid stenosis measurements (when applicable) are obtained utilizing NASCET criteria, using the distal internal carotid diameter as the denominator. Venographic phase images of the brain were obtained following the administration of intravenous contrast. Multiplanar reformats and maximum intensity projections were generated. RADIATION DOSE REDUCTION: This exam was performed according to the departmental dose-optimization program which includes automated exposure control, adjustment of the mA and/or kV according to patient size and/or use  of iterative reconstruction technique. CONTRAST:  75mL OMNIPAQUE IOHEXOL 350 MG/ML SOLN COMPARISON:  MRI HEAD October 04, 2023. MRA HEAD Jan 01, 2021. FINDINGS: CT HEAD FINDINGS Brain: When comparing across modalities, similar size of a left cerebral convexity subdural hematoma with similar mass effect. Similar rightward midline shift. No evidence of acute large vascular territory infarct,  mass lesion, or hydrocephalus. Patchy white matter hypodensities are nonspecific but compatible with chronic microvascular ischemic disease. Vascular: Please see below. Skull: No acute fracture. Sinuses/Orbits: Clear sinuses.  No acute orbital findings. Other: No mastoid effusions. Review of the MIP images confirms the above findings CTA NECK FINDINGS Aortic arch: Great vessel origins are patent. Right carotid system: No evidence of dissection, stenosis (50% or greater), or occlusion. Left carotid system: No evidence of dissection, stenosis (50% or greater), or occlusion. Vertebral arteries: Left dominant. No evidence of dissection, stenosis (50% or greater), or occlusion. Skeleton: No acute abnormality on limited assessment. Other neck: No acute abnormality on limited assessment. Upper chest: Visualized lung apices are clear. Review of the MIP images confirms the above findings CTA HEAD FINDINGS Anterior circulation: Bilateral intracranial ICAs, MCAs, and ACAs are patent without proximal hemodynamically significant stenosis. No aneurysm identified. No evidence of arteriovenous malformation. Posterior circulation: Bilateral intradural vertebral arteries, basilar artery and bilateral posterior cerebral arteries are patent without proximal hemodynamically significant stenosis. No aneurysm identified. No evidence of arteriovenous malformation. Venous sinuses: See below. Review of the MIP images confirms the above findings CT VENOGRAM FINDINGS No evidence of dural venous sinus thrombosis. Specifically, the superior sagittal,  transverse, sigmoid, and straight sinuses are patent. The left transverse and sigmoid sinuses are small/hypoplastic. The visible deep cerebral veins are patent. Symmetric cavernous sinus opacification. IMPRESSION: 1. When comparing across modalities, similar size of a left cerebral convexity subdural hematoma with similar mass effect. 2. No large vessel occlusion or proximal hemodynamically significant stenosis. 3. No evidence of dural venous sinus thrombosis. Electronically Signed   By: Feliberto Harts M.D.   On: 10/04/2023 13:26   CT VENOGRAM HEAD Result Date: 10/04/2023 CLINICAL DATA:  Neuro deficit, acute, stroke suspected Transient right arm and right leg weakness/coordination problem this morning. Now resolved. Also new headache over the last few days.; Dural venous sinus thrombosis suspected EXAM: CT ANGIOGRAPHY HEAD AND NECK WITH AND WITHOUT CONTRAST CT VENOGRAM HEAD TECHNIQUE: Multidetector CT imaging of the head and neck was performed using the standard protocol during bolus administration of intravenous contrast. Multiplanar CT image reconstructions and MIPs were obtained to evaluate the vascular anatomy. Carotid stenosis measurements (when applicable) are obtained utilizing NASCET criteria, using the distal internal carotid diameter as the denominator. Venographic phase images of the brain were obtained following the administration of intravenous contrast. Multiplanar reformats and maximum intensity projections were generated. RADIATION DOSE REDUCTION: This exam was performed according to the departmental dose-optimization program which includes automated exposure control, adjustment of the mA and/or kV according to patient size and/or use of iterative reconstruction technique. CONTRAST:  75mL OMNIPAQUE IOHEXOL 350 MG/ML SOLN COMPARISON:  MRI HEAD October 04, 2023. MRA HEAD Jan 01, 2021. FINDINGS: CT HEAD FINDINGS Brain: When comparing across modalities, similar size of a left cerebral convexity  subdural hematoma with similar mass effect. Similar rightward midline shift. No evidence of acute large vascular territory infarct, mass lesion, or hydrocephalus. Patchy white matter hypodensities are nonspecific but compatible with chronic microvascular ischemic disease. Vascular: Please see below. Skull: No acute fracture. Sinuses/Orbits: Clear sinuses.  No acute orbital findings. Other: No mastoid effusions. Review of the MIP images confirms the above findings CTA NECK FINDINGS Aortic arch: Great vessel origins are patent. Right carotid system: No evidence of dissection, stenosis (50% or greater), or occlusion. Left carotid system: No evidence of dissection, stenosis (50% or greater), or occlusion. Vertebral arteries: Left dominant. No evidence of dissection, stenosis (50% or greater), or occlusion.  Skeleton: No acute abnormality on limited assessment. Other neck: No acute abnormality on limited assessment. Upper chest: Visualized lung apices are clear. Review of the MIP images confirms the above findings CTA HEAD FINDINGS Anterior circulation: Bilateral intracranial ICAs, MCAs, and ACAs are patent without proximal hemodynamically significant stenosis. No aneurysm identified. No evidence of arteriovenous malformation. Posterior circulation: Bilateral intradural vertebral arteries, basilar artery and bilateral posterior cerebral arteries are patent without proximal hemodynamically significant stenosis. No aneurysm identified. No evidence of arteriovenous malformation. Venous sinuses: See below. Review of the MIP images confirms the above findings CT VENOGRAM FINDINGS No evidence of dural venous sinus thrombosis. Specifically, the superior sagittal, transverse, sigmoid, and straight sinuses are patent. The left transverse and sigmoid sinuses are small/hypoplastic. The visible deep cerebral veins are patent. Symmetric cavernous sinus opacification. IMPRESSION: 1. When comparing across modalities, similar size of a  left cerebral convexity subdural hematoma with similar mass effect. 2. No large vessel occlusion or proximal hemodynamically significant stenosis. 3. No evidence of dural venous sinus thrombosis. Electronically Signed   By: Feliberto Harts M.D.   On: 10/04/2023 13:26   MR Brain W and Wo Contrast Result Date: 10/04/2023 CLINICAL DATA:  Neuro deficit, acute, stroke suspected Right arm and right leg coordination problem and weakness this morning. Reports last few weeks had worsening bilateral arm tingling issues. Previous MRI showed abnormalities of head and neck. EXAM: MRI HEAD WITHOUT AND WITH CONTRAST TECHNIQUE: Multiplanar, multiecho pulse sequences of the brain and surrounding structures were obtained without and with intravenous contrast. CONTRAST:  8mL GADAVIST GADOBUTROL 1 MMOL/ML IV SOLN COMPARISON:  MRI/MRA brain 01/01/2021. FINDINGS: Brain: Acute appearing left convexity subdural hematoma measuring up to 10 mm in thickness (coronal image 17 series 9). Mild mass effect on the underlying parenchyma of the posterior left frontal and parietal lobes. No midline shift. No acute infarct. Background of moderate chronic small-vessel disease, unchanged from prior. No abnormal parenchymal susceptibility. Vascular: Normal flow voids. Skull and upper cervical spine: Normal marrow signal. Sinuses/Orbits: Negative. Other: None. IMPRESSION: Acute appearing left convexity subdural hematoma measuring up to 10 mm in thickness. Mild mass effect on the underlying parenchyma of the posterior left frontal and parietal lobes. No midline shift. Critical Value/emergent results were called by telephone at the time of interpretation on 10/04/2023 at 12:24 pm to provider [facility] , who verbally acknowledged these results. Electronically Signed   By: Orvan Falconer M.D.   On: 10/04/2023 12:25    Data Reviewed: Relevant notes from primary care and specialist visits, past discharge summaries as available in EHR,  including Care Everywhere. Prior diagnostic testing as pertinent to current admission diagnoses, Updated medications and problem lists for reconciliation ED course, including vitals, labs, imaging, treatment and response to treatment,Triage notes, nursing and pharmacy notes and ED provider's notes Notable results as noted in HPI.Discussed case with EDMD/ ED APP/ or Specialty MD on call and as needed.  >>Assessment and Plan: * Subdural hematoma (HCC) Suspect secondary to poorly controlled blood pressures.  Greatly appreciate neurosurgery consulting on patient.    Headache Suspect from HTN and Hypertensive SDH.  Goal BP 140's and off anticoagulation or any NSAID or antiplatelets and pharmacological DVT prophylaxis.   Diabetes mellitus (HCC) N.p.o. except sips, if patient passes bedside swallow can start patient on a carb consistent cardiac diet.  Peptic ulcer IV PPI therapy aspiration precaution. Swallow evaluation at bedside.   Essential hypertension Vitals:   10/04/23 0812  BP: 134/76  Will continue patient on her amlodipine  and valsartan hold Lasix.   Rheumatoid arthritis involving multiple sites with positive rheumatoid factor (HCC) Patient is on leflunomide 20 mg daily which we will continue starting tomorrow.  Also once med rec is confirmed.   DVT prophylaxis:  Scd's  Consults:  Neurosurgery.   Advance Care Planning:    Code Status: Full Code   Family Communication:  Spouse at bedside.   Disposition Plan:  Home.   Severity of Illness: The appropriate patient status for this patient is INPATIENT. Inpatient status is judged to be reasonable and necessary in order to provide the required intensity of service to ensure the patient's safety. The patient's presenting symptoms, physical exam findings, and initial radiographic and laboratory data in the context of their chronic comorbidities is felt to place them at high risk for further clinical deterioration.  Furthermore, it is not anticipated that the patient will be medically stable for discharge from the hospital within 2 midnights of admission.   * I certify that at the point of admission it is my clinical judgment that the patient will require inpatient hospital care spanning beyond 2 midnights from the point of admission due to high intensity of service, high risk for further deterioration and high frequency of surveillance required.*  Author: Gertha Calkin, MD 10/04/2023 2:22 PM  For on call review [email or web address].   Unresulted Labs (From admission, onward)     Start     Ordered   10/05/23 0500  Comprehensive metabolic panel  Tomorrow morning,   R        10/04/23 1419   10/05/23 0500  CBC  Tomorrow morning,   R        10/04/23 1419   10/04/23 1417  TSH  Once,   R        10/04/23 1419   10/04/23 1417  Hemoglobin A1c  Add-on,   AD        10/04/23 1419   10/04/23 1414  HIV Antibody (routine testing w rflx)  (HIV Antibody (Routine testing w reflex) panel)  Once,   R        10/04/23 1419            Orders Placed This Encounter  Procedures   CT ANGIO HEAD NECK W WO CM   CT VENOGRAM HEAD   MR Brain W and Wo Contrast   CBC with Differential   Comprehensive metabolic panel   TSH   HIV Antibody (routine testing w rflx)   Comprehensive metabolic panel   CBC   TSH   Hemoglobin A1c   Diet NPO time specified   ED Cardiac monitoring   Maintain IV access   Vital signs   Notify physician (specify)   Mobility Protocol: No Restrictions RN to initiate protocols based on patient's level of care   Refer to Sidebar Report Refer to ICU, Med-Surg, Progressive, and Step-Down Mobility Protocol Sidebars   Initiate Adult Central Line Maintenance and Catheter Protocol for patients with central line (CVC, PICC, Port, Hemodialysis, Trialysis)   Daily weights   Intake and Output   Do not place and if present remove PureWick   Initiate Oral Care Protocol   Initiate Carrier Fluid Protocol   RN may  order General Admission PRN Orders utilizing "General Admission PRN medications" (through manage orders) for the following patient needs: allergy symptoms (Claritin), cold sores (Carmex), cough (Robitussin DM), eye irritation (Liquifilm Tears), hemorrhoids (Tucks), indigestion (Maalox), minor skin irritation (Hydrocortisone Cream), muscle pain Romeo Apple Gay), nose irritation (saline nasal  spray) and sore throat (Chloraseptic spray).   Swallow screen   NIH Stroke Scale   Swallow screen   Neuro checks   Cardiac Monitoring Continuous x 48 hours Indications for use: Acute neurological event   Full code   Consult to neurosurgery   Consult to hospitalist   OT eval and treat   PT eval and treat   Pulse oximetry check with vital signs   Oxygen therapy Mode or (Route): Nasal cannula; Liters Per Minute: 2; Keep O2 saturation between: greater than 92 %   SLP eval and treat Reason for evaluation: .Swallowing evaluation (BSE, MBS and/or diet order as indicated)   EKG 12-Lead   EKG 12-Lead   Admit to Inpatient (patient's expected length of stay will be greater than 2 midnights or inpatient only procedure)   Aspiration precautions   Fall precautions   Seizure precautions

## 2023-10-04 NOTE — Assessment & Plan Note (Addendum)
 Suspect from HTN and Hypertensive SDH.  Goal BP 140's and off anticoagulation or any NSAID or antiplatelets and pharmacological DVT prophylaxis.

## 2023-10-04 NOTE — ED Triage Notes (Addendum)
 Patient arrives for feeling of R sided weakness present on waking today. LKW 0300. Endorses headache x 3 days. Ambulatory and no drift noted on exam. States at home she was unable to grip a wash cloth she was trying to hold but weakness has resolved since then.

## 2023-10-04 NOTE — Assessment & Plan Note (Signed)
 IV PPI therapy aspiration precaution. Swallow evaluation at bedside.

## 2023-10-04 NOTE — Hospital Course (Addendum)
        Assessment and Plan:      Class I Obesity -Complicates overall prognosis and care -Estimated body mass index is 31.06 kg/m as calculated from the following:   Height as of 09/24/23: 5\' 6"  (1.676 m).   Weight as of this encounter: 87.3 kg.  -Weight Loss and Dietary Counseling given

## 2023-10-04 NOTE — Assessment & Plan Note (Addendum)
 Patient is on leflunomide 20 mg daily which we will continue starting tomorrow.  Also once med rec is confirmed.

## 2023-10-04 NOTE — Consult Note (Signed)
 Providing Compassionate, Quality Care - Together   Reason for Consult: Subdural hematoma Referring Physician: Dr. Marlana Salvage [name] is an 68 y.o. female.  HPI: Ms. Rebekah [name] is a 68 year old female with a history of atrial tachycardia, type 2 diabetes mellitus, hypertension, hypercholesterolemia, and rheumatoid arteritis.She reports a headache that started on Monday. She does not recall a precipitating injury. She presented to the [facility] emergency department this morning with complaint of right sided weakness upon waking today. This has since resolved. She reports her headache is better since receiving pain medication in the emergency department. She denies changes in speech, facial droop, dysphagia, nausea/vomiting, or changes in vision. MRI performed in the emergency department showed an acute-appearing left convexity subdural hematoma. Neurosurgery was consulted for further evaluation and recommendations.  Past Medical History:  Diagnosis Date   Arthritis    Atrial tachycardia (HCC) 01/17/2020   Diabetes mellitus type 2 in obese 10/10/2017   Essential hypertension 10/10/2017   Hypertension    Morbid obesity (HCC) 10/10/2017   Pure hypercholesterolemia 10/10/2017   Rheumatoid arteritis (HCC)    Vitamin D deficiency     Past Surgical History:  Procedure Laterality Date   ABDOMINAL HYSTERECTOMY     biopsy on tongue  1976   BREAST EXCISIONAL BIOPSY Left    CESAREAN SECTION     TUBAL LIGATION      Family History  Problem Relation Age of Onset   CAD Mother    Cerebral aneurysm Father    Stroke Sister    CVA Sister    Heart attack Maternal Grandmother    Stroke Maternal Grandmother    Alzheimer's disease Maternal Grandfather    Cancer Paternal Grandmother     Social History:  reports that she quit smoking about 2 years ago. Her smoking use included cigarettes. She has never used smokeless tobacco. She reports current alcohol use. She reports that she does not use  drugs.  Allergies:  Allergies  Allergen Reactions   Lisinopril Cough   Crestor [Rosuvastatin] Other (See Comments)    myalgias    Medications: I have reviewed the patient's current medications.  Results for orders placed or performed during the hospital encounter of 10/04/23 (from the past 48 hours)  CBC with Differential     Status: None   Collection Time: 10/04/23 10:30 AM  Result Value Ref Range   WBC 5.3 4.0 - 10.5 K/uL   RBC 4.69 3.87 - 5.11 MIL/uL   Hemoglobin 14.0 12.0 - 15.0 g/dL   HCT 78.2 95.6 - 21.3 %   MCV 96.8 80.0 - 100.0 fL   MCH 29.9 26.0 - 34.0 pg   MCHC 30.8 30.0 - 36.0 g/dL   RDW 08.6 57.8 - 46.9 %   Platelets 236 150 - 400 K/uL   nRBC 0.0 0.0 - 0.2 %   Neutrophils Relative % 56 %   Neutro Abs 3.0 1.7 - 7.7 K/uL   Lymphocytes Relative 33 %   Lymphs Abs 1.7 0.7 - 4.0 K/uL   Monocytes Relative 8 %   Monocytes Absolute 0.4 0.1 - 1.0 K/uL   Eosinophils Relative 2 %   Eosinophils Absolute 0.1 0.0 - 0.5 K/uL   Basophils Relative 1 %   Basophils Absolute 0.0 0.0 - 0.1 K/uL   Immature Granulocytes 0 %   Abs Immature Granulocytes 0.02 0.00 - 0.07 K/uL    Comment: Performed at [facility], [address], [place], [place] [address]  Comprehensive metabolic  panel     Status: Abnormal   Collection Time: 10/04/23 10:30 AM  Result Value Ref Range   Sodium 140 135 - 145 mmol/L   Potassium 3.7 3.5 - 5.1 mmol/L   Chloride 106 98 - 111 mmol/L   CO2 21 (L) 22 - 32 mmol/L   Glucose, Bld 72 70 - 99 mg/dL    Comment: Glucose reference range applies only to samples taken after fasting for at least 8 hours.   BUN 19 8 - 23 mg/dL   Creatinine, Ser 1.61 0.44 - 1.00 mg/dL   Calcium 9.5 8.9 - 09.6 mg/dL   Total Protein 7.1 6.5 - 8.1 g/dL   Albumin 3.7 3.5 - 5.0 g/dL   AST 24 15 - 41 U/L   ALT 15 0 - 44 U/L   Alkaline Phosphatase 80 38 - 126 U/L   Total Bilirubin 0.3 0.0 - 1.2 mg/dL   GFR, Estimated >04 >54 mL/min    Comment: (NOTE) Calculated using the CKD-EPI  Creatinine Equation (2021)    Anion gap 13 5 - 15    Comment: Performed at [facility], [address], [place], [place] [address]  TSH     Status: None   Collection Time: 10/04/23 10:31 AM  Result Value Ref Range   TSH 1.044 0.350 - 4.500 uIU/mL    Comment: Performed by a 3rd Generation assay with a functional sensitivity of <=0.01 uIU/mL. Performed at [facility], [address], [place], [place] [address]     MR Brain W and Wo Contrast Result Date: 10/04/2023 CLINICAL DATA:  Neuro deficit, acute, stroke suspected Right arm and right leg coordination problem and weakness this morning. Reports last few weeks had worsening bilateral arm tingling issues. Previous MRI showed abnormalities of head and neck. EXAM: MRI HEAD WITHOUT AND WITH CONTRAST TECHNIQUE: Multiplanar, multiecho pulse sequences of the brain and surrounding structures were obtained without and with intravenous contrast. CONTRAST:  8mL GADAVIST GADOBUTROL 1 MMOL/ML IV SOLN COMPARISON:  MRI/MRA brain 01/01/2021. FINDINGS: Brain: Acute appearing left convexity subdural hematoma measuring up to 10 mm in thickness (coronal image 17 series 9). Mild mass effect on the underlying parenchyma of the posterior left frontal and parietal lobes. No midline shift. No acute infarct. Background of moderate chronic small-vessel disease, unchanged from prior. No abnormal parenchymal susceptibility. Vascular: Normal flow voids. Skull and upper cervical spine: Normal marrow signal. Sinuses/Orbits: Negative. Other: None. IMPRESSION: Acute appearing left convexity subdural hematoma measuring up to 10 mm in thickness. Mild mass effect on the underlying parenchyma of the posterior left frontal and parietal lobes. No midline shift. Critical Value/emergent results were called by telephone at the time of interpretation on 10/04/2023 at 12:24 pm to provider [facility] , who verbally acknowledged these results. Electronically Signed   By: Orvan Falconer M.D.   On: 10/04/2023 12:25    Review of Systems  Constitutional: Negative.   HENT: Negative.    Eyes: Negative.   Respiratory: Negative.    Cardiovascular: Negative.   Gastrointestinal: Negative.   Genitourinary: Negative.   Skin: Negative.   Neurological:  Positive for focal weakness and headaches.  Endo/Heme/Allergies: Negative.   Psychiatric/Behavioral: Negative.     Blood pressure 127/63, pulse 80, temperature 97.8 F (36.6 C), temperature source Oral, resp. rate 16, last menstrual period 08/13/1998, SpO2 99%. Estimated body mass index is 29.7 kg/m as calculated from the following:   Height as of 09/24/23: 5\' 6"  (1.676 m).   Weight  as of 09/24/23: 83.5 kg.  Physical Exam Constitutional:      General: She is sleeping.     Appearance: Normal appearance.  HENT:     Head: Normocephalic and atraumatic.     Nose: Nose normal.     Mouth/Throat:     Mouth: Mucous membranes are moist.     Pharynx: Oropharynx is clear.  Eyes:     Extraocular Movements: Extraocular movements intact.     Pupils: Pupils are equal, round, and reactive to light.  Cardiovascular:     Rate and Rhythm: Normal rate and regular rhythm.     Pulses: Normal pulses.  Pulmonary:     Effort: Pulmonary effort is normal. No respiratory distress.  Abdominal:     General: Abdomen is flat.     Palpations: Abdomen is soft.  Musculoskeletal:        General: Normal range of motion.     Cervical back: Normal range of motion and neck supple.  Skin:    General: Skin is warm and dry.     Capillary Refill: Capillary refill takes less than 2 seconds.  Neurological:     General: No focal deficit present.     Mental Status: She is easily aroused.  Psychiatric:        Mood and Affect: Mood normal.        Behavior: Behavior normal.     Assessment/Plan: Patient with an acute-appearing small SDH. No surgical intervention recommended currently. Recommend neuro checks and a follow up head CT in the morning.  Neurosurgery will continue to follow.   Val Eagle, DNP, AGNP-C Nurse Practitioner  [facility] [address], [place], [place] [address] P: [phone]    F: [phone]  10/04/2023, 1:10 PM

## 2023-10-05 ENCOUNTER — Inpatient Hospital Stay (HOSPITAL_COMMUNITY): Payer: Federal, State, Local not specified - PPO

## 2023-10-05 DIAGNOSIS — M0579 Rheumatoid arthritis with rheumatoid factor of multiple sites without organ or systems involvement: Secondary | ICD-10-CM

## 2023-10-05 DIAGNOSIS — S065XAA Traumatic subdural hemorrhage with loss of consciousness status unknown, initial encounter: Secondary | ICD-10-CM | POA: Diagnosis not present

## 2023-10-05 DIAGNOSIS — K279 Peptic ulcer, site unspecified, unspecified as acute or chronic, without hemorrhage or perforation: Secondary | ICD-10-CM | POA: Diagnosis not present

## 2023-10-05 DIAGNOSIS — I10 Essential (primary) hypertension: Secondary | ICD-10-CM

## 2023-10-05 DIAGNOSIS — E1165 Type 2 diabetes mellitus with hyperglycemia: Secondary | ICD-10-CM

## 2023-10-05 LAB — GLUCOSE, CAPILLARY
Glucose-Capillary: 89 mg/dL (ref 70–99)
Glucose-Capillary: 103 mg/dL — ABNORMAL HIGH (ref 70–99)

## 2023-10-05 LAB — CBC
HCT: 36.3 % (ref 36.0–46.0)
Hemoglobin: 12 g/dL (ref 12.0–15.0)
MCH: 30.3 pg (ref 26.0–34.0)
MCHC: 33.1 g/dL (ref 30.0–36.0)
MCV: 91.7 fL (ref 80.0–100.0)
Platelets: 231 10*3/uL (ref 150–400)
RBC: 3.96 MIL/uL (ref 3.87–5.11)
RDW: 13 % (ref 11.5–15.5)
WBC: 4.7 10*3/uL (ref 4.0–10.5)
nRBC: 0 % (ref 0.0–0.2)

## 2023-10-05 LAB — COMPREHENSIVE METABOLIC PANEL
ALT: 15 U/L (ref 0–44)
AST: 19 U/L (ref 15–41)
Albumin: 3.1 g/dL — ABNORMAL LOW (ref 3.5–5.0)
Alkaline Phosphatase: 69 U/L (ref 38–126)
Anion gap: 10 (ref 5–15)
BUN: 14 mg/dL (ref 8–23)
CO2: 24 mmol/L (ref 22–32)
Calcium: 8.9 mg/dL (ref 8.9–10.3)
Chloride: 104 mmol/L (ref 98–111)
Creatinine, Ser: 0.77 mg/dL (ref 0.44–1.00)
GFR, Estimated: >60 mL/min (ref >60)
Glucose, Bld: 89 mg/dL (ref 70–99)
Potassium: 3.6 mmol/L (ref 3.5–5.1)
Sodium: 138 mmol/L (ref 135–145)
Total Bilirubin: 0.6 mg/dL (ref 0.0–1.2)
Total Protein: 7.1 g/dL (ref 6.5–8.1)

## 2023-10-05 NOTE — Progress Notes (Signed)
 SLP Cancellation Note  Patient Details Name: Rebekah [name] MRN: [id number] DOB: 21-Dec-1955   Cancelled treatment:       Reason Eval/Treat Not Completed: Pt passed the [facility] swallow screen and RN reports no concerns with pt on regular diet. SLP will s/o for swallowing, but consider ordering a cognitive-linguistic evaluation if warranted.    Gwynneth Aliment, M.A., CF-SLP Speech Language Pathology, Acute Rehabilitation Services  Secure Chat preferred [phone]  10/05/2023, 8:25 AM

## 2023-10-05 NOTE — Discharge Summary (Signed)
 Physician Discharge Summary   Patient: Rebekah [name] MRN: [id number] DOB: Sep 30, 1955  Admit date:     10/04/2023  Discharge date: 10/05/23  Discharge Physician: Marguerita Merles, DO   PCP: Mila Palmer, MD   Recommendations at discharge:  {Tip this will not be part of the note when signed- Example include specific recommendations for outpatient follow-up, pending tests to follow-up on. (Optional):26781}  ***  Discharge Diagnoses: Principal Problem:   Subdural hematoma (HCC) Active Problems:   Headache   Rheumatoid arthritis involving multiple sites with positive rheumatoid factor (HCC)   Essential hypertension   Peptic ulcer   Diabetes mellitus (HCC)   Right sided weakness  Resolved Problems:   * No resolved hospital problems. *  Hospital Course:        Assessment and Plan:      Class I Obesity -Complicates overall prognosis and care -Estimated body mass index is 31.06 kg/m as calculated from the following:   Height as of 09/24/23: 5\' 6"  (1.676 m).   Weight as of this encounter: 87.3 kg.  -Weight Loss and Dietary Counseling given   Assessment and Plan: * Subdural hematoma (HCC) Suspect secondary to poorly controlled blood pressures.  Greatly appreciate neurosurgery consulting on patient.    Headache Suspect from HTN and Hypertensive SDH.  Goal BP 140's and off anticoagulation or any NSAID or antiplatelets and pharmacological DVT prophylaxis.   Diabetes mellitus (HCC) N.p.o. except sips, if patient passes bedside swallow can start patient on a carb consistent cardiac diet.  Peptic ulcer IV PPI therapy aspiration precaution. Swallow evaluation at bedside.   Essential hypertension Vitals:   10/04/23 0812  BP: 134/76  Will continue patient on her amlodipine and valsartan hold Lasix.   Rheumatoid arthritis involving multiple sites with positive rheumatoid factor (HCC) Patient is on leflunomide 20 mg daily which we will continue starting  tomorrow.  Also once med rec is confirmed.      {Tip this will not be part of the note when signed Body mass index is 31.06 kg/m. , ,  (Optional):26781}  {(NOTE) Pain control PDMP Statment (Optional):26782} Consultants: *** Procedures performed: ***  Disposition: {Plan; Disposition:26390} Diet recommendation:  {Diet_Plan:26776} DISCHARGE MEDICATION: Allergies as of 10/05/2023       Reactions   Lisinopril Cough   Crestor [rosuvastatin] Other (See Comments)   myalgias     Med Rec must be completed prior to using this SMART[employer]***       Follow-up Information     Tressie Stalker, MD. Schedule an appointment as soon as possible for a visit in 2 week(s).   Specialty: Neurosurgery Contact information: [address] [place] [address] [phone]                Discharge Exam: Ceasar Mons Weights   10/05/23 0407  Weight: 87.3 kg   ***  Condition at discharge: {DC Condition:26389}  The results of significant diagnostics from this hospitalization (including imaging, microbiology, ancillary and laboratory) are listed below for reference.   Imaging Studies: CT HEAD WO CONTRAST ( ) Result Date: 10/05/2023 CLINICAL DATA:  68 year old female with neurologic deficit, headache, left side subdural hematoma. EXAM: CT HEAD WITHOUT CONTRAST TECHNIQUE: Contiguous axial images were obtained from the base of the skull through the vertex without intravenous contrast. RADIATION DOSE REDUCTION: This exam was performed according to the departmental dose-optimization program which includes automated exposure control, adjustment of the mA and/or kV according to patient size and/or use of iterative reconstruction technique. COMPARISON:  Brain  MRI 10/04/2023. FINDINGS: Brain: Mixed density, complex, septated and somewhat biconvex left side subdural hematoma or collection is stable measuring 10-11 mm maximal thickness as seen on coronal image 28. This spans across the  left superior convexity from front to back. Trace rightward midline shift is stable along with mild mass effect on the left lateral ventricle. No other intracranial hemorrhage identified. Basilar cisterns are patent. Patchy nonspecific cerebral white matter hypodensity appears stable to the MRI T2/FLAIR appearance no ventriculomegaly. No cortically based acute infarct identified. Vascular: No suspicious intracranial vascular hyperdensity. Skull: Intact.  No acute osseous abnormality identified. Sinuses/Orbits: Visualized paranasal sinuses and mastoids are clear. Other: Visualized orbits and scalp soft tissues are within normal limits. IMPRESSION: 1. Complex, septated and biconvex mixed density Left Side Subdural Hematoma/Collection stable from MRI yesterday. 2. Stable associated mild intracranial mass effect. 3. Advanced, nonspecific cerebral white matter changes. Electronically Signed   By: Odessa Fleming M.D.   On: 10/05/2023 09:12   CT ANGIO HEAD NECK W WO CM Result Date: 10/04/2023 CLINICAL DATA:  Neuro deficit, acute, stroke suspected Transient right arm and right leg weakness/coordination problem this morning. Now resolved. Also new headache over the last few days.; Dural venous sinus thrombosis suspected EXAM: CT ANGIOGRAPHY HEAD AND NECK WITH AND WITHOUT CONTRAST CT VENOGRAM HEAD TECHNIQUE: Multidetector CT imaging of the head and neck was performed using the standard protocol during bolus administration of intravenous contrast. Multiplanar CT image reconstructions and MIPs were obtained to evaluate the vascular anatomy. Carotid stenosis measurements (when applicable) are obtained utilizing NASCET criteria, using the distal internal carotid diameter as the denominator. Venographic phase images of the brain were obtained following the administration of intravenous contrast. Multiplanar reformats and maximum intensity projections were generated. RADIATION DOSE REDUCTION: This exam was performed according to the  departmental dose-optimization program which includes automated exposure control, adjustment of the mA and/or kV according to patient size and/or use of iterative reconstruction technique. CONTRAST:  75mL OMNIPAQUE IOHEXOL 350 MG/ML SOLN COMPARISON:  MRI HEAD October 04, 2023. MRA HEAD Jan 01, 2021. FINDINGS: CT HEAD FINDINGS Brain: When comparing across modalities, similar size of a left cerebral convexity subdural hematoma with similar mass effect. Similar rightward midline shift. No evidence of acute large vascular territory infarct, mass lesion, or hydrocephalus. Patchy white matter hypodensities are nonspecific but compatible with chronic microvascular ischemic disease. Vascular: Please see below. Skull: No acute fracture. Sinuses/Orbits: Clear sinuses.  No acute orbital findings. Other: No mastoid effusions. Review of the MIP images confirms the above findings CTA NECK FINDINGS Aortic arch: Great vessel origins are patent. Right carotid system: No evidence of dissection, stenosis (50% or greater), or occlusion. Left carotid system: No evidence of dissection, stenosis (50% or greater), or occlusion. Vertebral arteries: Left dominant. No evidence of dissection, stenosis (50% or greater), or occlusion. Skeleton: No acute abnormality on limited assessment. Other neck: No acute abnormality on limited assessment. Upper chest: Visualized lung apices are clear. Review of the MIP images confirms the above findings CTA HEAD FINDINGS Anterior circulation: Bilateral intracranial ICAs, MCAs, and ACAs are patent without proximal hemodynamically significant stenosis. No aneurysm identified. No evidence of arteriovenous malformation. Posterior circulation: Bilateral intradural vertebral arteries, basilar artery and bilateral posterior cerebral arteries are patent without proximal hemodynamically significant stenosis. No aneurysm identified. No evidence of arteriovenous malformation. Venous sinuses: See below. Review of the  MIP images confirms the above findings CT VENOGRAM FINDINGS No evidence of dural venous sinus thrombosis. Specifically, the superior sagittal, transverse, sigmoid, and straight  sinuses are patent. The left transverse and sigmoid sinuses are small/hypoplastic. The visible deep cerebral veins are patent. Symmetric cavernous sinus opacification. IMPRESSION: 1. When comparing across modalities, similar size of a left cerebral convexity subdural hematoma with similar mass effect. 2. No large vessel occlusion or proximal hemodynamically significant stenosis. 3. No evidence of dural venous sinus thrombosis. Electronically Signed   By: Feliberto Harts M.D.   On: 10/04/2023 13:26   CT VENOGRAM HEAD Result Date: 10/04/2023 CLINICAL DATA:  Neuro deficit, acute, stroke suspected Transient right arm and right leg weakness/coordination problem this morning. Now resolved. Also new headache over the last few days.; Dural venous sinus thrombosis suspected EXAM: CT ANGIOGRAPHY HEAD AND NECK WITH AND WITHOUT CONTRAST CT VENOGRAM HEAD TECHNIQUE: Multidetector CT imaging of the head and neck was performed using the standard protocol during bolus administration of intravenous contrast. Multiplanar CT image reconstructions and MIPs were obtained to evaluate the vascular anatomy. Carotid stenosis measurements (when applicable) are obtained utilizing NASCET criteria, using the distal internal carotid diameter as the denominator. Venographic phase images of the brain were obtained following the administration of intravenous contrast. Multiplanar reformats and maximum intensity projections were generated. RADIATION DOSE REDUCTION: This exam was performed according to the departmental dose-optimization program which includes automated exposure control, adjustment of the mA and/or kV according to patient size and/or use of iterative reconstruction technique. CONTRAST:  75mL OMNIPAQUE IOHEXOL 350 MG/ML SOLN COMPARISON:  MRI HEAD October 04, 2023. MRA HEAD Jan 01, 2021. FINDINGS: CT HEAD FINDINGS Brain: When comparing across modalities, similar size of a left cerebral convexity subdural hematoma with similar mass effect. Similar rightward midline shift. No evidence of acute large vascular territory infarct, mass lesion, or hydrocephalus. Patchy white matter hypodensities are nonspecific but compatible with chronic microvascular ischemic disease. Vascular: Please see below. Skull: No acute fracture. Sinuses/Orbits: Clear sinuses.  No acute orbital findings. Other: No mastoid effusions. Review of the MIP images confirms the above findings CTA NECK FINDINGS Aortic arch: Great vessel origins are patent. Right carotid system: No evidence of dissection, stenosis (50% or greater), or occlusion. Left carotid system: No evidence of dissection, stenosis (50% or greater), or occlusion. Vertebral arteries: Left dominant. No evidence of dissection, stenosis (50% or greater), or occlusion. Skeleton: No acute abnormality on limited assessment. Other neck: No acute abnormality on limited assessment. Upper chest: Visualized lung apices are clear. Review of the MIP images confirms the above findings CTA HEAD FINDINGS Anterior circulation: Bilateral intracranial ICAs, MCAs, and ACAs are patent without proximal hemodynamically significant stenosis. No aneurysm identified. No evidence of arteriovenous malformation. Posterior circulation: Bilateral intradural vertebral arteries, basilar artery and bilateral posterior cerebral arteries are patent without proximal hemodynamically significant stenosis. No aneurysm identified. No evidence of arteriovenous malformation. Venous sinuses: See below. Review of the MIP images confirms the above findings CT VENOGRAM FINDINGS No evidence of dural venous sinus thrombosis. Specifically, the superior sagittal, transverse, sigmoid, and straight sinuses are patent. The left transverse and sigmoid sinuses are small/hypoplastic. The  visible deep cerebral veins are patent. Symmetric cavernous sinus opacification. IMPRESSION: 1. When comparing across modalities, similar size of a left cerebral convexity subdural hematoma with similar mass effect. 2. No large vessel occlusion or proximal hemodynamically significant stenosis. 3. No evidence of dural venous sinus thrombosis. Electronically Signed   By: Feliberto Harts M.D.   On: 10/04/2023 13:26   MR Brain W and Wo Contrast Result Date: 10/04/2023 CLINICAL DATA:  Neuro deficit, acute, stroke suspected Right arm and right  leg coordination problem and weakness this morning. Reports last few weeks had worsening bilateral arm tingling issues. Previous MRI showed abnormalities of head and neck. EXAM: MRI HEAD WITHOUT AND WITH CONTRAST TECHNIQUE: Multiplanar, multiecho pulse sequences of the brain and surrounding structures were obtained without and with intravenous contrast. CONTRAST:  8mL GADAVIST GADOBUTROL 1 MMOL/ML IV SOLN COMPARISON:  MRI/MRA brain 01/01/2021. FINDINGS: Brain: Acute appearing left convexity subdural hematoma measuring up to 10 mm in thickness (coronal image 17 series 9). Mild mass effect on the underlying parenchyma of the posterior left frontal and parietal lobes. No midline shift. No acute infarct. Background of moderate chronic small-vessel disease, unchanged from prior. No abnormal parenchymal susceptibility. Vascular: Normal flow voids. Skull and upper cervical spine: Normal marrow signal. Sinuses/Orbits: Negative. Other: None. IMPRESSION: Acute appearing left convexity subdural hematoma measuring up to 10 mm in thickness. Mild mass effect on the underlying parenchyma of the posterior left frontal and parietal lobes. No midline shift. Critical Value/emergent results were called by telephone at the time of interpretation on 10/04/2023 at 12:24 pm to provider [facility] , who verbally acknowledged these results. Electronically Signed   By: Orvan Falconer M.D.   On:  10/04/2023 12:25    Microbiology: Results for orders placed or performed in visit on 10/21/19  Novel Coronavirus, NAA ([facility])     Status: None   Collection Time: 10/21/19  3:04 PM   Specimen: Nasopharyngeal(NP) swabs in vial transport medium   NASOPHARYNGE  TESTING  Result Value Ref Range Status   SARS-CoV-2, NAA Not Detected Not Detected Final    Comment: Testing was performed using the cobas(R) SARS-CoV-2 test. This nucleic acid amplification test was developed and its performance characteristics determined by [employer]. Nucleic acid amplification tests include RT-PCR and TMA. This test has not been FDA cleared or approved. This test has been authorized by FDA under an Emergency Use Authorization (EUA). This test is only authorized for the duration of time the declaration that circumstances exist justifying the authorization of the emergency use of in vitro diagnostic tests for detection of SARS-CoV-2 virus and/or diagnosis of COVID-19 infection under section 564(b)(1) of the Act, 21 U.S.C. 409WJX-9(J) (1), unless the authorization is terminated or revoked sooner. When diagnostic testing is negative, the possibility of a false negative result should be considered in the context of a patient's recent exposures and the presence of clinical signs and symptoms consistent with COVID-19. An individual without sympto ms of COVID-19 and who is not shedding SARS-CoV-2 virus would expect to have a negative (not detected) result in this assay.     Labs: CBC: Recent Labs  Lab 10/04/23 1030 10/05/23 0652  WBC 5.3 4.7  NEUTROABS 3.0  --   HGB 14.0 12.0  HCT 45.4 36.3  MCV 96.8 91.7  PLT 236 231   Basic Metabolic Panel: Recent Labs  Lab 10/04/23 1030 10/05/23 0652  NA 140 138  K 3.7 3.6  CL 106 104  CO2 21* 24  GLUCOSE 72 89  BUN 19 14  CREATININE 0.75 0.77  CALCIUM 9.5 8.9   Liver Function Tests: Recent Labs  Lab 10/04/23 1030 10/05/23 0652  AST 24 19   ALT 15 15  ALKPHOS 80 69  BILITOT 0.3 0.6  PROT 7.1 7.1  ALBUMIN 3.7 3.1*   CBG: Recent Labs  Lab 10/04/23 2134 10/05/23 0633 10/05/23 1223  GLUCAP 108* 89 103*    Discharge time spent: {LESS THAN/GREATER THAN:26388} 30 minutes.  Signed: Merlene Laughter, DO  [facility] 10/05/2023

## 2023-10-05 NOTE — Progress Notes (Signed)
 Providing Compassionate, Quality Care - Together   Subjective: Patient reports no new issues. She feels much better today.  Objective: Vital signs in last 24 hours: Temp:  [97.8 F (36.6 C)-99.3 F (37.4 C)] 98.6 F (37 C) (02/22 0801) Pulse Rate:  [80-90] 84 (02/22 0801) Resp:  [16-18] 18 (02/21 1939) BP: (106-143)/(56-82) 117/68 (02/22 0801) SpO2:  [97 %-100 %] 98 % (02/22 0801) Weight:  [87.3 kg] 87.3 kg (02/22 0407)  Intake/Output from previous day: 02/21 0701 - 02/22 0700 In: 360 [P.O.:360] Out: -  Intake/Output this shift: Total I/O In: 240 [P.O.:240] Out: -   Alert and oriented x 4 PERRLA CN II-XII grossly intact MAE, Strength and sensation intact   Lab Results: Recent Labs    10/04/23 1030 10/05/23 0652  WBC 5.3 4.7  HGB 14.0 12.0  HCT 45.4 36.3  PLT 236 231   BMET Recent Labs    10/04/23 1030 10/05/23 0652  NA 140 138  K 3.7 3.6  CL 106 104  CO2 21* 24  GLUCOSE 72 89  BUN 19 14  CREATININE 0.75 0.77  CALCIUM 9.5 8.9    Studies/Results: CT HEAD WO CONTRAST ( ) Result Date: 10/05/2023 CLINICAL DATA:  68 year old female with neurologic deficit, headache, left side subdural hematoma. EXAM: CT HEAD WITHOUT CONTRAST TECHNIQUE: Contiguous axial images were obtained from the base of the skull through the vertex without intravenous contrast. RADIATION DOSE REDUCTION: This exam was performed according to the departmental dose-optimization program which includes automated exposure control, adjustment of the mA and/or kV according to patient size and/or use of iterative reconstruction technique. COMPARISON:  Brain MRI 10/04/2023. FINDINGS: Brain: Mixed density, complex, septated and somewhat biconvex left side subdural hematoma or collection is stable measuring 10-11 mm maximal thickness as seen on coronal image 28. This spans across the left superior convexity from front to back. Trace rightward midline shift is stable along with mild mass effect on the  left lateral ventricle. No other intracranial hemorrhage identified. Basilar cisterns are patent. Patchy nonspecific cerebral white matter hypodensity appears stable to the MRI T2/FLAIR appearance no ventriculomegaly. No cortically based acute infarct identified. Vascular: No suspicious intracranial vascular hyperdensity. Skull: Intact.  No acute osseous abnormality identified. Sinuses/Orbits: Visualized paranasal sinuses and mastoids are clear. Other: Visualized orbits and scalp soft tissues are within normal limits. IMPRESSION: 1. Complex, septated and biconvex mixed density Left Side Subdural Hematoma/Collection stable from MRI yesterday. 2. Stable associated mild intracranial mass effect. 3. Advanced, nonspecific cerebral white matter changes. Electronically Signed   By: Odessa Fleming M.D.   On: 10/05/2023 09:12   CT ANGIO HEAD NECK W WO CM Result Date: 10/04/2023 CLINICAL DATA:  Neuro deficit, acute, stroke suspected Transient right arm and right leg weakness/coordination problem this morning. Now resolved. Also new headache over the last few days.; Dural venous sinus thrombosis suspected EXAM: CT ANGIOGRAPHY HEAD AND NECK WITH AND WITHOUT CONTRAST CT VENOGRAM HEAD TECHNIQUE: Multidetector CT imaging of the head and neck was performed using the standard protocol during bolus administration of intravenous contrast. Multiplanar CT image reconstructions and MIPs were obtained to evaluate the vascular anatomy. Carotid stenosis measurements (when applicable) are obtained utilizing NASCET criteria, using the distal internal carotid diameter as the denominator. Venographic phase images of the brain were obtained following the administration of intravenous contrast. Multiplanar reformats and maximum intensity projections were generated. RADIATION DOSE REDUCTION: This exam was performed according to the departmental dose-optimization program which includes automated exposure control, adjustment of the mA and/or  kV  according to patient size and/or use of iterative reconstruction technique. CONTRAST:  75mL OMNIPAQUE IOHEXOL 350 MG/ML SOLN COMPARISON:  MRI HEAD October 04, 2023. MRA HEAD Jan 01, 2021. FINDINGS: CT HEAD FINDINGS Brain: When comparing across modalities, similar size of a left cerebral convexity subdural hematoma with similar mass effect. Similar rightward midline shift. No evidence of acute large vascular territory infarct, mass lesion, or hydrocephalus. Patchy white matter hypodensities are nonspecific but compatible with chronic microvascular ischemic disease. Vascular: Please see below. Skull: No acute fracture. Sinuses/Orbits: Clear sinuses.  No acute orbital findings. Other: No mastoid effusions. Review of the MIP images confirms the above findings CTA NECK FINDINGS Aortic arch: Great vessel origins are patent. Right carotid system: No evidence of dissection, stenosis (50% or greater), or occlusion. Left carotid system: No evidence of dissection, stenosis (50% or greater), or occlusion. Vertebral arteries: Left dominant. No evidence of dissection, stenosis (50% or greater), or occlusion. Skeleton: No acute abnormality on limited assessment. Other neck: No acute abnormality on limited assessment. Upper chest: Visualized lung apices are clear. Review of the MIP images confirms the above findings CTA HEAD FINDINGS Anterior circulation: Bilateral intracranial ICAs, MCAs, and ACAs are patent without proximal hemodynamically significant stenosis. No aneurysm identified. No evidence of arteriovenous malformation. Posterior circulation: Bilateral intradural vertebral arteries, basilar artery and bilateral posterior cerebral arteries are patent without proximal hemodynamically significant stenosis. No aneurysm identified. No evidence of arteriovenous malformation. Venous sinuses: See below. Review of the MIP images confirms the above findings CT VENOGRAM FINDINGS No evidence of dural venous sinus thrombosis.  Specifically, the superior sagittal, transverse, sigmoid, and straight sinuses are patent. The left transverse and sigmoid sinuses are small/hypoplastic. The visible deep cerebral veins are patent. Symmetric cavernous sinus opacification. IMPRESSION: 1. When comparing across modalities, similar size of a left cerebral convexity subdural hematoma with similar mass effect. 2. No large vessel occlusion or proximal hemodynamically significant stenosis. 3. No evidence of dural venous sinus thrombosis. Electronically Signed   By: Feliberto Harts M.D.   On: 10/04/2023 13:26   CT VENOGRAM HEAD Result Date: 10/04/2023 CLINICAL DATA:  Neuro deficit, acute, stroke suspected Transient right arm and right leg weakness/coordination problem this morning. Now resolved. Also new headache over the last few days.; Dural venous sinus thrombosis suspected EXAM: CT ANGIOGRAPHY HEAD AND NECK WITH AND WITHOUT CONTRAST CT VENOGRAM HEAD TECHNIQUE: Multidetector CT imaging of the head and neck was performed using the standard protocol during bolus administration of intravenous contrast. Multiplanar CT image reconstructions and MIPs were obtained to evaluate the vascular anatomy. Carotid stenosis measurements (when applicable) are obtained utilizing NASCET criteria, using the distal internal carotid diameter as the denominator. Venographic phase images of the brain were obtained following the administration of intravenous contrast. Multiplanar reformats and maximum intensity projections were generated. RADIATION DOSE REDUCTION: This exam was performed according to the departmental dose-optimization program which includes automated exposure control, adjustment of the mA and/or kV according to patient size and/or use of iterative reconstruction technique. CONTRAST:  75mL OMNIPAQUE IOHEXOL 350 MG/ML SOLN COMPARISON:  MRI HEAD October 04, 2023. MRA HEAD Jan 01, 2021. FINDINGS: CT HEAD FINDINGS Brain: When comparing across modalities, similar  size of a left cerebral convexity subdural hematoma with similar mass effect. Similar rightward midline shift. No evidence of acute large vascular territory infarct, mass lesion, or hydrocephalus. Patchy white matter hypodensities are nonspecific but compatible with chronic microvascular ischemic disease. Vascular: Please see below. Skull: No acute fracture. Sinuses/Orbits: Clear sinuses.  No  acute orbital findings. Other: No mastoid effusions. Review of the MIP images confirms the above findings CTA NECK FINDINGS Aortic arch: Great vessel origins are patent. Right carotid system: No evidence of dissection, stenosis (50% or greater), or occlusion. Left carotid system: No evidence of dissection, stenosis (50% or greater), or occlusion. Vertebral arteries: Left dominant. No evidence of dissection, stenosis (50% or greater), or occlusion. Skeleton: No acute abnormality on limited assessment. Other neck: No acute abnormality on limited assessment. Upper chest: Visualized lung apices are clear. Review of the MIP images confirms the above findings CTA HEAD FINDINGS Anterior circulation: Bilateral intracranial ICAs, MCAs, and ACAs are patent without proximal hemodynamically significant stenosis. No aneurysm identified. No evidence of arteriovenous malformation. Posterior circulation: Bilateral intradural vertebral arteries, basilar artery and bilateral posterior cerebral arteries are patent without proximal hemodynamically significant stenosis. No aneurysm identified. No evidence of arteriovenous malformation. Venous sinuses: See below. Review of the MIP images confirms the above findings CT VENOGRAM FINDINGS No evidence of dural venous sinus thrombosis. Specifically, the superior sagittal, transverse, sigmoid, and straight sinuses are patent. The left transverse and sigmoid sinuses are small/hypoplastic. The visible deep cerebral veins are patent. Symmetric cavernous sinus opacification. IMPRESSION: 1. When comparing  across modalities, similar size of a left cerebral convexity subdural hematoma with similar mass effect. 2. No large vessel occlusion or proximal hemodynamically significant stenosis. 3. No evidence of dural venous sinus thrombosis. Electronically Signed   By: Feliberto Harts M.D.   On: 10/04/2023 13:26   MR Brain W and Wo Contrast Result Date: 10/04/2023 CLINICAL DATA:  Neuro deficit, acute, stroke suspected Right arm and right leg coordination problem and weakness this morning. Reports last few weeks had worsening bilateral arm tingling issues. Previous MRI showed abnormalities of head and neck. EXAM: MRI HEAD WITHOUT AND WITH CONTRAST TECHNIQUE: Multiplanar, multiecho pulse sequences of the brain and surrounding structures were obtained without and with intravenous contrast. CONTRAST:  8mL GADAVIST GADOBUTROL 1 MMOL/ML IV SOLN COMPARISON:  MRI/MRA brain 01/01/2021. FINDINGS: Brain: Acute appearing left convexity subdural hematoma measuring up to 10 mm in thickness (coronal image 17 series 9). Mild mass effect on the underlying parenchyma of the posterior left frontal and parietal lobes. No midline shift. No acute infarct. Background of moderate chronic small-vessel disease, unchanged from prior. No abnormal parenchymal susceptibility. Vascular: Normal flow voids. Skull and upper cervical spine: Normal marrow signal. Sinuses/Orbits: Negative. Other: None. IMPRESSION: Acute appearing left convexity subdural hematoma measuring up to 10 mm in thickness. Mild mass effect on the underlying parenchyma of the posterior left frontal and parietal lobes. No midline shift. Critical Value/emergent results were called by telephone at the time of interpretation on 10/04/2023 at 12:24 pm to provider [facility] , who verbally acknowledged these results. Electronically Signed   By: Orvan Falconer M.D.   On: 10/04/2023 12:25    Assessment/Plan: Patient with subacute-appearing small SDH. No surgical intervention  recommended. Follow up imaging stable. Patient is ok to discharge home from a Neurosurgical perspective. Recommend outpatient follow up in 2 weeks with CT head.    LOS: 1 day    Val Eagle, DNP, AGNP-C Nurse Practitioner  [facility] [address], [address], [place], [place] [address] P: [phone]    F: [phone]  10/05/2023, 10:15 AM

## 2023-10-05 NOTE — Progress Notes (Signed)
 PT Cancellation Note  Patient Details Name: Rebekah [name] MRN: [id number] DOB: 1956-03-10   Cancelled Treatment:    Reason Eval/Treat Not Completed: (P) Patient at procedure or test/unavailable Pt is off floor for CT. PT will follow back later for Evaluation.  Raigen Jagielski B. Beverely Risen PT, DPT Acute Rehabilitation Services Please use secure chat or  Call Office [phone]    Elon Alas [facility] 10/05/2023, 8:51 AM

## 2023-10-05 NOTE — Progress Notes (Signed)
 PT is recommending outpt neuro rehab. Attempted to meet with pt to discuss outpt rehab but she was discharged. Contacted pt at [phone], spoke with pt. She agrees with outpt rehab. She prefers a rehab center in [place]. Provided pt with [facility] phone number and address. Unable to add referral since pt has been D/C. Pt sees the neurologist on 10/08/23. Encouraged pt to discuss the outpt rehab referral with neurologist. She verbalized understanding.

## 2023-10-05 NOTE — Evaluation (Signed)
 Occupational Therapy Evaluation Patient Details Name: Rebekah [name] MRN: [id number] DOB: November 27, 1955 Today's Date: 10/05/2023   History of Present Illness   68 y.o. female presents to ED 2/21 with R sided weakness and R sided lean. Pt reports headache since 2/17. MRI showing Acute appearing left convexity subdural hematoma measuring up to 10 mm in thickness no midline shift. PMH: hypertension, hyperlipidemia, TIA, Rheumatoid arthritis and diabetes     Clinical Impressions Pt admitted for above, PTA pt was independent in mobility and ADLs, working full time and driving. Pt currently OOB with supervision per the PT, they noted some challenges regarding depth perception. Upon further assessment pt not having trouble with depth perception of BUEs, coordination also intact, sensation was impaired but she has a hx of neuropathy at baseline. Advised pt to follow-up with her neurologist on impaired BUE sensation as she was unaware of impaired 2pt discrimination test, she is overall Mod I for ADLs. Pt to have all other needs met in next venue of care. Recommending post acute outpatient neuro OT.      If plan is discharge home, recommend the following:   Other (comment) (prn)     Functional Status Assessment   Patient has not had a recent decline in their functional status     Equipment Recommendations   None recommended by OT     Recommendations for Other Services         Precautions/Restrictions   Precautions Precautions: None Restrictions Weight Bearing Restrictions Per Provider Order: No     Mobility Bed Mobility Overal bed mobility: Modified Independent                  Transfers                          Balance Overall balance assessment: Needs assistance Sitting-balance support: Feet supported, No upper extremity supported Sitting balance-Leahy Scale: Good                                     ADL either performed or assessed  with clinical judgement   ADL Overall ADL's : Modified independent                                       General ADL Comments: Assessed pt depth perception following a report with PT, pt doing well with ball toss/catch and using objects to swing at tossed ball accurately. Rapid tapping of BLEs across a set object on floor intact (similar to moving foot from gas to brake)     Vision Baseline Vision/History: 1 Wears glasses Patient Visual Report: No change from baseline Vision Assessment?: No apparent visual deficits     Perception Perception: Within Functional Limits       Praxis         Pertinent Vitals/Pain Pain Assessment Pain Assessment: No/denies pain     Extremity/Trunk Assessment Upper Extremity Assessment Upper Extremity Assessment: RUE deficits/detail;LUE deficits/detail;Overall WFL for tasks assessed;Right hand dominant RUE Deficits / Details: impaired 2 pt discrimination present in distal aspect of forearm RUE Sensation: history of peripheral neuropathy LUE Deficits / Details: impaired 2 pt discrimination present in distal aspect of forearm. Overall 3+/5 MMT LUE Sensation: history of peripheral neuropathy   Lower Extremity Assessment Lower Extremity Assessment: Generalized weakness;RLE deficits/detail;LLE  deficits/detail RLE Sensation: decreased proprioception RLE Coordination: decreased fine motor LLE Sensation: decreased light touch   Cervical / Trunk Assessment Cervical / Trunk Assessment: Normal   Communication Communication Communication: No apparent difficulties   Cognition Arousal: Alert Behavior During Therapy: WFL for tasks assessed/performed Cognition: No apparent impairments                               Following commands: Intact       Cueing  General Comments   Cueing Techniques: Verbal cues;Tactile cues      Exercises     Shoulder Instructions      Home Living Family/patient expects to be  discharged to:: Private residence Living Arrangements: Spouse/significant other Available Help at Discharge: Available 24 hours/day;Family Type of Home: House Home Access: Level entry     Home Layout: One level     Bathroom Shower/Tub: Producer, television/film/video: Standard Bathroom Accessibility: Yes   Home Equipment: Shower seat - built in;Grab bars - tub/shower;Hand held shower head;Cane - single point          Prior Functioning/Environment Prior Level of Function : Independent/Modified Independent             Mobility Comments: works full time, driving ADLs Comments: independent with ADLs and iADLs    OT Problem List: Impaired sensation   OT Treatment/Interventions:        OT Goals(Current goals can be found in the care plan section)   Acute Rehab OT Goals Patient Stated Goal: To go home OT Goal Formulation: With patient Time For Goal Achievement: 10/19/23 Potential to Achieve Goals: Good   OT Frequency:       Co-evaluation              AM-PAC OT "6 Clicks" Daily Activity     Outcome Measure Help from another person eating meals?: None Help from another person taking care of personal grooming?: None Help from another person toileting, which includes using toliet, bedpan, or urinal?: None Help from another person bathing (including washing, rinsing, drying)?: None Help from another person to put on and taking off regular upper body clothing?: None Help from another person to put on and taking off regular lower body clothing?: None 6 Click Score: 24   End of Session Nurse Communication: Mobility status  Activity Tolerance: Patient tolerated treatment well Patient left: in bed;with call bell/phone within reach;with family/visitor present  OT Visit Diagnosis: Muscle weakness (generalized) (M62.81)                Time: 1610-9604 OT Time Calculation (min): 13 min Charges:  OT General Charges $OT Visit: 1 Visit OT Evaluation $OT Eval Low  Complexity: 1 Low  10/05/2023  AB, OTR/L  Acute Rehabilitation Services  Office: [phone]   Tristan Schroeder 10/05/2023, 3:22 PM

## 2023-10-05 NOTE — Plan of Care (Signed)

## 2023-10-05 NOTE — Evaluation (Addendum)
 Physical Therapy Evaluation Patient Details Name: Rebekah [name] MRN: [id number] DOB: [birth year] Today's Date: 10/05/2023  History of Present Illness  68 y.o. female presents to ED 2/21 with R sided weakness and R sided lean. Pt reports headache since 2/17. MRI showing Acute appearing left convexity subdural hematoma measuring up to 10 mm in thickness no midline shift. PMH: hypertension, hyperlipidemia, TIA, Rheumatoid arthritis and diabetes  Clinical Impression  PTA pt living with husband in single story home with level entry. Pt works full time, drives and is independent in ADLs and iADLs. Pt is currently limited in safe mobility by coordination and depth perception deficits especially with reaching with R UE across midline, and with visual recognition of objects below her knees with ambulation. Pt is overall mod I for bed mobility and transfers and supervision for ambulation without AD. Pt contact guard for ascent/descent of steps of varying heights. Pt scored 18/24 on DGI (indicative of falls in older adults) grossly due to perception deficits. PT recommending OP Neuro PT at discharge to address coordination and perception issues. PT will continue to follow acutely, but pt hopeful to discharge home today.         If plan is discharge home, recommend the following: Assist for transportation   Can travel by private vehicle    Yes    Equipment Recommendations None recommended by PT     Functional Status Assessment Patient has had a recent decline in their functional status and demonstrates the ability to make significant improvements in function in a reasonable and predictable amount of time.     Precautions / Restrictions Precautions Precautions: None Restrictions Weight Bearing Restrictions Per Provider Order: No      Mobility  Bed Mobility Overal bed mobility: Modified Independent             General bed mobility comments: HoB elevated and use of rail to pull to EoB     Transfers Overall transfer level: Modified independent                 General transfer comment: independent to power up from bed and self steady, requires use of rail in bathroom to rise from low toilet    Ambulation/Gait Ambulation/Gait assistance: Supervision Gait Distance (Feet): 500 Feet Assistive device: None Gait Pattern/deviations: Step-through pattern, Decreased step length - right, Decreased weight shift to right, Scissoring, Drifts right/left Gait velocity: very slow for age Gait velocity interpretation: <1.8 ft/sec, indicate of risk for recurrent falls   General Gait Details: supervision for safety, mild drifting with straight line ambulation, occasional R foot scissoring, mildly unsteady, no overt LoB  Stairs Stairs: Yes Stairs assistance: Contact guard assist Stair Management: No rails, Two rails, Forwards Number of Stairs: 2 General stair comments: utilized 2-6 inch step, with 3-4 inch steps for descent. pt able to walk up 6 inch steps no problem but stumbles as she steps down 4 inch step and needs to use bilateral handrail. Pt unable to quickly coordinate change in step height        Balance Overall balance assessment: Needs assistance Sitting-balance support: Feet supported, No upper extremity supported Sitting balance-Leahy Scale: Good     Standing balance support: During functional activity, No upper extremity supported Standing balance-Leahy Scale: Fair                   Standardized Balance Assessment Standardized Balance Assessment : Dynamic Gait Index   Dynamic Gait Index Level Surface: Mild Impairment Change in Gait Speed:  Mild Impairment Gait with Horizontal Head Turns: Normal Gait with Vertical Head Turns: Normal Gait and Pivot Turn: Normal Step Over Obstacle: Mild Impairment (unable to coordinate clearing obstacle) Step Around Obstacles: Moderate Impairment Steps: Mild Impairment Total Score: 18       Pertinent  Vitals/Pain Pain Assessment Pain Assessment: No/denies pain    Home Living Family/patient expects to be discharged to:: Private residence Living Arrangements: Spouse/significant other Available Help at Discharge: Available 24 hours/day;Family Type of Home: House Home Access: Level entry       Home Layout: One level Home Equipment: Shower seat - built in;Grab bars - tub/shower;Hand held shower head;Cane - single point      Prior Function Prior Level of Function : Independent/Modified Independent             Mobility Comments: works full time, driving ADLs Comments: independent with ADLs and iADLs     Extremity/Trunk Assessment   Upper Extremity Assessment Upper Extremity Assessment: Defer to OT evaluation    Lower Extremity Assessment Lower Extremity Assessment: Generalized weakness;RLE deficits/detail;LLE deficits/detail RLE Sensation: decreased proprioception RLE Coordination: decreased fine motor LLE Sensation: decreased light touch    Cervical / Trunk Assessment Cervical / Trunk Assessment: Normal  Communication   Communication Communication: No apparent difficulties    Cognition Arousal: Alert Behavior During Therapy: WFL for tasks assessed/performed   PT - Cognitive impairments: Awareness                       PT - Cognition Comments: pt with decreased awareness of her deficits with depth perception during stair climb Following commands: Intact       Cueing Cueing Techniques: Verbal cues, Tactile cues     General Comments General comments (skin integrity, edema, etc.): VSS on RA,        Assessment/Plan    PT Assessment Patient needs continued PT services  PT Problem List Decreased coordination       PT Treatment Interventions Gait training;Stair training;Neuromuscular re-education;Balance training    PT Goals (Current goals can be found in the Care Plan section)  Acute Rehab PT Goals PT Goal Formulation: With patient Time For  Goal Achievement: 10/19/23 Potential to Achieve Goals: Fair    Frequency Min 1X/week        AM-PAC PT "6 Clicks" Mobility  Outcome Measure Help needed turning from your back to your side while in a flat bed without using bedrails?: None Help needed moving from lying on your back to sitting on the side of a flat bed without using bedrails?: None Help needed moving to and from a bed to a chair (including a wheelchair)?: None Help needed standing up from a chair using your arms (e.g., wheelchair or bedside chair)?: None Help needed to walk in hospital room?: None Help needed climbing 3-5 steps with a railing? : None 6 Click Score: 24    End of Session Equipment Utilized During Treatment: Gait belt Activity Tolerance: Patient tolerated treatment well Patient left: in bed;with call bell/phone within reach;with family/visitor present Nurse Communication: Mobility status PT Visit Diagnosis: Other symptoms and signs involving the nervous system (R29.898);Other abnormalities of gait and mobility (R26.89)    Time: 4132-4401 PT Time Calculation (min) (ACUTE ONLY): 31 min   Charges:   PT Evaluation $PT Eval Moderate Complexity: 1 Mod PT Treatments $Gait Training: 8-22 mins PT General Charges $$ ACUTE PT VISIT: 1 Visit         Laikynn Pollio B. Beverely Risen PT, DPT  Acute Rehabilitation Services Please use secure chat or  Call Office [phone]   [facility] 10/05/2023, 12:51 PM

## 2023-10-05 NOTE — Progress Notes (Signed)
 Discharge instructions reviewed with patient, all questions answered. Copy of AVS given to patient. All belongings with patient. Sent home with family via personal transport at this time.

## 2024-01-17 ENCOUNTER — Ambulatory Visit (HOSPITAL_BASED_OUTPATIENT_CLINIC_OR_DEPARTMENT_OTHER): Payer: Federal, State, Local not specified - PPO | Admitting: Cardiovascular Disease

## 2024-10-17 ENCOUNTER — Ambulatory Visit (HOSPITAL_BASED_OUTPATIENT_CLINIC_OR_DEPARTMENT_OTHER): Payer: Self-pay | Admitting: Physical Therapy

## 2024-10-24 ENCOUNTER — Encounter (HOSPITAL_BASED_OUTPATIENT_CLINIC_OR_DEPARTMENT_OTHER): Payer: Self-pay | Admitting: Physical Therapy

## 2024-10-31 ENCOUNTER — Encounter (HOSPITAL_BASED_OUTPATIENT_CLINIC_OR_DEPARTMENT_OTHER): Payer: Self-pay | Admitting: Physical Therapy

## 2024-11-07 ENCOUNTER — Encounter (HOSPITAL_BASED_OUTPATIENT_CLINIC_OR_DEPARTMENT_OTHER): Payer: Self-pay | Admitting: Physical Therapy
